# Patient Record
Sex: Female | Born: 1935 | Race: Black or African American | Hispanic: No | Smoking: Never smoker
Health system: Southern US, Community
[De-identification: ages and names within clinical notes are randomized; demographics above are authoritative.]

## PROBLEM LIST (undated history)

## (undated) DIAGNOSIS — K759 Inflammatory liver disease, unspecified: Secondary | ICD-10-CM

## (undated) DIAGNOSIS — F329 Major depressive disorder, single episode, unspecified: Secondary | ICD-10-CM

## (undated) DIAGNOSIS — IMO0001 Reserved for inherently not codable concepts without codable children: Secondary | ICD-10-CM

## (undated) DIAGNOSIS — D649 Anemia, unspecified: Secondary | ICD-10-CM

## (undated) DIAGNOSIS — R011 Cardiac murmur, unspecified: Secondary | ICD-10-CM

## (undated) DIAGNOSIS — M1990 Unspecified osteoarthritis, unspecified site: Secondary | ICD-10-CM

## (undated) DIAGNOSIS — J40 Bronchitis, not specified as acute or chronic: Secondary | ICD-10-CM

## (undated) DIAGNOSIS — R350 Frequency of micturition: Secondary | ICD-10-CM

## (undated) DIAGNOSIS — F32A Depression, unspecified: Secondary | ICD-10-CM

## (undated) DIAGNOSIS — B019 Varicella without complication: Secondary | ICD-10-CM

## (undated) DIAGNOSIS — E039 Hypothyroidism, unspecified: Secondary | ICD-10-CM

## (undated) DIAGNOSIS — E785 Hyperlipidemia, unspecified: Secondary | ICD-10-CM

## (undated) DIAGNOSIS — K259 Gastric ulcer, unspecified as acute or chronic, without hemorrhage or perforation: Secondary | ICD-10-CM

## (undated) DIAGNOSIS — N390 Urinary tract infection, site not specified: Secondary | ICD-10-CM

## (undated) DIAGNOSIS — Z5189 Encounter for other specified aftercare: Secondary | ICD-10-CM

## (undated) HISTORY — PX: BACK SURGERY: SHX140

## (undated) HISTORY — DX: Hyperlipidemia, unspecified: E78.5

## (undated) HISTORY — DX: Cardiac murmur, unspecified: R01.1

## (undated) HISTORY — DX: Unspecified osteoarthritis, unspecified site: M19.90

## (undated) HISTORY — PX: BREAST LUMPECTOMY: SHX2

## (undated) HISTORY — DX: Varicella without complication: B01.9

## (undated) HISTORY — PX: TUBAL LIGATION: SHX77

## (undated) HISTORY — DX: Major depressive disorder, single episode, unspecified: F32.9

## (undated) HISTORY — DX: Depression, unspecified: F32.A

## (undated) HISTORY — PX: ABDOMINAL HYSTERECTOMY: SHX81

## (undated) HISTORY — PX: ABDOMINAL SURGERY: SHX537

## (undated) HISTORY — PX: DILATION AND CURETTAGE OF UTERUS: SHX78

## (undated) HISTORY — DX: Hypothyroidism, unspecified: E03.9

---

## 2005-07-08 HISTORY — PX: KNEE ARTHROSCOPY: SUR90

## 2008-09-10 ENCOUNTER — Emergency Department (HOSPITAL_COMMUNITY): Admission: EM | Admit: 2008-09-10 | Discharge: 2008-09-11 | Payer: Self-pay | Admitting: Emergency Medicine

## 2008-12-23 ENCOUNTER — Encounter: Admission: RE | Admit: 2008-12-23 | Discharge: 2008-12-23 | Payer: Self-pay | Admitting: *Deleted

## 2009-10-11 ENCOUNTER — Encounter (INDEPENDENT_AMBULATORY_CARE_PROVIDER_SITE_OTHER): Payer: Self-pay | Admitting: *Deleted

## 2009-10-25 ENCOUNTER — Ambulatory Visit: Payer: Self-pay | Admitting: Family Medicine

## 2009-10-25 ENCOUNTER — Encounter (INDEPENDENT_AMBULATORY_CARE_PROVIDER_SITE_OTHER): Payer: Self-pay | Admitting: *Deleted

## 2009-10-25 ENCOUNTER — Encounter: Admission: RE | Admit: 2009-10-25 | Discharge: 2009-10-25 | Payer: Self-pay | Admitting: Internal Medicine

## 2009-10-25 DIAGNOSIS — F329 Major depressive disorder, single episode, unspecified: Secondary | ICD-10-CM

## 2009-10-25 DIAGNOSIS — E039 Hypothyroidism, unspecified: Secondary | ICD-10-CM

## 2009-10-25 DIAGNOSIS — M542 Cervicalgia: Secondary | ICD-10-CM | POA: Insufficient documentation

## 2009-10-25 DIAGNOSIS — F29 Unspecified psychosis not due to a substance or known physiological condition: Secondary | ICD-10-CM | POA: Insufficient documentation

## 2009-10-25 DIAGNOSIS — E785 Hyperlipidemia, unspecified: Secondary | ICD-10-CM | POA: Insufficient documentation

## 2009-10-26 ENCOUNTER — Telehealth (INDEPENDENT_AMBULATORY_CARE_PROVIDER_SITE_OTHER): Payer: Self-pay | Admitting: *Deleted

## 2009-10-26 ENCOUNTER — Encounter: Payer: Self-pay | Admitting: Family Medicine

## 2009-10-26 LAB — CONVERTED CEMR LAB
ALT: 25 units/L (ref 0–35)
Alkaline Phosphatase: 65 units/L (ref 39–117)
BUN: 6 mg/dL (ref 6–23)
Basophils Absolute: 0 10*3/uL (ref 0.0–0.1)
Bilirubin, Direct: 0 mg/dL (ref 0.0–0.3)
Chloride: 105 meq/L (ref 96–112)
Creatinine, Ser: 0.7 mg/dL (ref 0.4–1.2)
Eosinophils Relative: 2.2 % (ref 0.0–5.0)
Folate: 9.2 ng/mL
GFR calc non Af Amer: 104.96 mL/min (ref >60)
MCV: 103.7 fL — ABNORMAL HIGH (ref 78.0–100.0)
Monocytes Absolute: 0.2 10*3/uL (ref 0.1–1.0)
Neutrophils Relative %: 38.9 % — ABNORMAL LOW (ref 43.0–77.0)
Platelets: 272 10*3/uL (ref 150.0–400.0)
T3, Free: 1.9 pg/mL — ABNORMAL LOW (ref 2.3–4.2)
TSH: 41.77 microintl units/mL — ABNORMAL HIGH (ref 0.35–5.50)
Total Protein: 7.3 g/dL (ref 6.0–8.3)
Vitamin B-12: 55 pg/mL — ABNORMAL LOW (ref 211–911)
WBC: 3.4 10*3/uL — ABNORMAL LOW (ref 4.5–10.5)

## 2009-10-30 ENCOUNTER — Ambulatory Visit: Payer: Self-pay | Admitting: Cardiovascular Disease

## 2009-10-30 ENCOUNTER — Ambulatory Visit: Payer: Self-pay | Admitting: Family Medicine

## 2009-10-30 DIAGNOSIS — E538 Deficiency of other specified B group vitamins: Secondary | ICD-10-CM

## 2009-10-31 ENCOUNTER — Telehealth (INDEPENDENT_AMBULATORY_CARE_PROVIDER_SITE_OTHER): Payer: Self-pay | Admitting: *Deleted

## 2009-11-02 ENCOUNTER — Telehealth (INDEPENDENT_AMBULATORY_CARE_PROVIDER_SITE_OTHER): Payer: Self-pay | Admitting: *Deleted

## 2009-11-06 ENCOUNTER — Ambulatory Visit: Payer: Self-pay | Admitting: Family Medicine

## 2009-11-22 ENCOUNTER — Ambulatory Visit: Payer: Self-pay | Admitting: Family Medicine

## 2009-11-22 DIAGNOSIS — N643 Galactorrhea not associated with childbirth: Secondary | ICD-10-CM | POA: Insufficient documentation

## 2009-11-22 DIAGNOSIS — K5900 Constipation, unspecified: Secondary | ICD-10-CM

## 2009-11-22 DIAGNOSIS — R109 Unspecified abdominal pain: Secondary | ICD-10-CM | POA: Insufficient documentation

## 2009-11-23 ENCOUNTER — Encounter (INDEPENDENT_AMBULATORY_CARE_PROVIDER_SITE_OTHER): Payer: Self-pay | Admitting: *Deleted

## 2009-11-24 ENCOUNTER — Encounter: Payer: Self-pay | Admitting: Family Medicine

## 2009-11-27 ENCOUNTER — Encounter: Payer: Self-pay | Admitting: Family Medicine

## 2009-11-30 ENCOUNTER — Telehealth (INDEPENDENT_AMBULATORY_CARE_PROVIDER_SITE_OTHER): Payer: Self-pay | Admitting: *Deleted

## 2009-12-01 ENCOUNTER — Ambulatory Visit: Payer: Self-pay | Admitting: Family Medicine

## 2009-12-15 ENCOUNTER — Encounter: Payer: Self-pay | Admitting: Family Medicine

## 2009-12-20 ENCOUNTER — Ambulatory Visit: Payer: Self-pay | Admitting: Internal Medicine

## 2009-12-20 DIAGNOSIS — R030 Elevated blood-pressure reading, without diagnosis of hypertension: Secondary | ICD-10-CM | POA: Insufficient documentation

## 2009-12-20 LAB — CONVERTED CEMR LAB: HDL goal, serum: 40 mg/dL

## 2009-12-25 ENCOUNTER — Encounter: Payer: Self-pay | Admitting: Family Medicine

## 2010-01-13 ENCOUNTER — Emergency Department (HOSPITAL_COMMUNITY): Admission: EM | Admit: 2010-01-13 | Discharge: 2010-01-13 | Payer: Self-pay | Admitting: Emergency Medicine

## 2010-01-23 ENCOUNTER — Telehealth (INDEPENDENT_AMBULATORY_CARE_PROVIDER_SITE_OTHER): Payer: Self-pay | Admitting: *Deleted

## 2010-01-31 ENCOUNTER — Ambulatory Visit: Payer: Self-pay | Admitting: Family Medicine

## 2010-02-02 ENCOUNTER — Telehealth (INDEPENDENT_AMBULATORY_CARE_PROVIDER_SITE_OTHER): Payer: Self-pay | Admitting: *Deleted

## 2010-02-05 ENCOUNTER — Encounter: Payer: Self-pay | Admitting: Family Medicine

## 2010-02-19 ENCOUNTER — Telehealth (INDEPENDENT_AMBULATORY_CARE_PROVIDER_SITE_OTHER): Payer: Self-pay | Admitting: *Deleted

## 2010-03-22 ENCOUNTER — Encounter: Payer: Self-pay | Admitting: Family Medicine

## 2010-04-03 ENCOUNTER — Telehealth: Payer: Self-pay | Admitting: Family Medicine

## 2010-04-20 ENCOUNTER — Telehealth (INDEPENDENT_AMBULATORY_CARE_PROVIDER_SITE_OTHER): Payer: Self-pay | Admitting: *Deleted

## 2010-04-20 ENCOUNTER — Ambulatory Visit: Payer: Self-pay | Admitting: Family Medicine

## 2010-04-20 DIAGNOSIS — L089 Local infection of the skin and subcutaneous tissue, unspecified: Secondary | ICD-10-CM | POA: Insufficient documentation

## 2010-04-20 LAB — CONVERTED CEMR LAB
ALT: 21 units/L (ref 0–35)
Albumin: 4.4 g/dL (ref 3.5–5.2)
Basophils Absolute: 0 10*3/uL (ref 0.0–0.1)
Basophils Relative: 0.5 % (ref 0.0–3.0)
Calcium: 9.5 mg/dL (ref 8.4–10.5)
Cholesterol: 145 mg/dL (ref 0–200)
GFR calc non Af Amer: 135.61 mL/min (ref >60)
HDL: 49.8 mg/dL (ref >39.00)
Hemoglobin: 14.3 g/dL (ref 12.0–15.0)
Lymphocytes Relative: 50.2 % — ABNORMAL HIGH (ref 12.0–46.0)
Monocytes Relative: 7.2 % (ref 3.0–12.0)
Neutro Abs: 1.9 10*3/uL (ref 1.4–7.7)
Neutrophils Relative %: 40.7 % — ABNORMAL LOW (ref 43.0–77.0)
RBC: 4.41 M/uL (ref 3.87–5.11)
Sodium: 140 meq/L (ref 135–145)
TSH: 2.59 microintl units/mL (ref 0.35–5.50)
Total Bilirubin: 1.5 mg/dL — ABNORMAL HIGH (ref 0.3–1.2)
Total Protein: 7.3 g/dL (ref 6.0–8.3)
Triglycerides: 71 mg/dL (ref 0.0–149.0)
VLDL: 14.2 mg/dL (ref 0.0–40.0)

## 2010-04-24 ENCOUNTER — Telehealth (INDEPENDENT_AMBULATORY_CARE_PROVIDER_SITE_OTHER): Payer: Self-pay | Admitting: *Deleted

## 2010-04-25 ENCOUNTER — Encounter: Payer: Self-pay | Admitting: Family Medicine

## 2010-05-03 ENCOUNTER — Encounter: Payer: Self-pay | Admitting: Family Medicine

## 2010-05-04 ENCOUNTER — Ambulatory Visit: Payer: Self-pay | Admitting: Family Medicine

## 2010-05-15 ENCOUNTER — Encounter: Payer: Self-pay | Admitting: Family Medicine

## 2010-05-16 ENCOUNTER — Encounter: Admission: RE | Admit: 2010-05-16 | Discharge: 2010-05-16 | Payer: Self-pay | Admitting: Neurology

## 2010-05-22 ENCOUNTER — Encounter: Payer: Self-pay | Admitting: Family Medicine

## 2010-05-29 ENCOUNTER — Encounter: Payer: Self-pay | Admitting: Family Medicine

## 2010-06-14 ENCOUNTER — Encounter: Payer: Self-pay | Admitting: Family Medicine

## 2010-07-29 ENCOUNTER — Encounter: Payer: Self-pay | Admitting: Internal Medicine

## 2010-08-07 NOTE — Miscellaneous (Signed)
Summary: Discharge/[facility]  Discharge/[facility]   Imported By: Lanelle Bal 06/01/2010 10:43:44  _____________________________________________________________________  External Attachment:    Type:   Image     Comment:   External Document

## 2010-08-07 NOTE — Progress Notes (Signed)
Summary: CT results-lmom  Phone Note Outgoing Call   Call placed by: Mallory Burke,  October 31, 2009 4:45 PM Call placed to: Patient Summary of Call: head CT shows normal aging changes and also changes due to limited blood flow through the small blood vessels.  could be responsible for some of pt's memory loss.  CT of neck shows degenerative changes of the spine and narrowing of the canal which is what is causing pt's pain when she turns her head.  will refer to ortho and/or neurosurg for evaluation and tx.   Follow-up for Phone Call        left message on machine ............Marland KitchenDoristine Burke  October 31, 2009 4:45 PM  left msg to call .Mallory Burke  November 01, 2009 9:44 AM Spoke with pt and pt grandaughter [name] informed of CT results and Dr Beverely Low recommendations of referral  Grandaughter request Ct results she will pickup labs upfront .Mallory Burke  November 01, 2009 2:37 PM   Follow-up by: Mallory Burke,  November 01, 2009 2:37 PM

## 2010-08-07 NOTE — Assessment & Plan Note (Signed)
Summary: 2 week roa//lch   Vital Signs:  Patient profile:   77 year old female Height:      62.5 inches Weight:      214 pounds BMI:     38.66 Pulse rate:   88 / minute BP sitting:   110 / 80  (left arm)  Vitals Entered By: Doristine Devoid CMA (May 04, 2010 4:12 PM) CC: 2 week roa- stomach discomfort    History of Present Illness: 77 yo woman here today to f/u abd wound.  denies oozing of wound.  has been using abx cream two times a day.  denies fever.  abd pain- left sided, pain w/ turning over at night.  no pain currently.  'feels like something pulled loose' after turning over one night.  sxs started 1 month ago.  no nausea, vomiting, diarrhea.  no fevers.  Current Medications (verified): 1)  Lorazepam 0.5 Mg Tabs (Lorazepam) .... Take One Tablet Three Times A Day As Needed 2)  Crestor 20 Mg Tabs (Rosuvastatin Calcium) .Marland Kitchen.. 1 Tablet By Mouth Daily 3)  Methocarbamol 750 Mg Tabs (Methocarbamol) .... Take One Tablet At Bedtime As Needed 4)  Citalopram Hydrobromide 10 Mg Tabs (Citalopram Hydrobromide) .... Take One Talbet Daily 5)  Levothroid 150 Mcg Tabs (Levothyroxine Sodium) .... Take One Tablet Daily 6)  Ex-Lax Maximum Strength 25 Mg Tabs (Sennosides) .... Take One Tablet As Needed 7)  Physical Therapy .... Order For Tens Unit, Neck Massage For Neck Pain, and Leg Exercises 2 Times A Week For 7 Weeks1 8)  Naprosyn 500 Mg Tabs (Naproxen) .... Take One Tablet Two Times A Day 9)  Pennsaid 1.5 % Soln (Diclofenac Sodium) .... As Needed 10)  Mupirocin 2 % Oint (Mupirocin) .... Apply To Belly Button Up To Three Times A Day X10 Days.  Allergies (verified): No Known Drug Allergies  Past History:  Past medical, surgical, family and social histories (including risk factors) reviewed, and no changes noted (except as noted below).  Past Medical History: Reviewed history from 10/25/2009 and no changes required. Arthritis hx of Chicken Pox Heart Murmur   Hyperlipidemia Hypothyroidism Depression  Past Surgical History: Reviewed history from 10/25/2009 and no changes required. Hysterectomy Back Surgery x3 fusion and rod  Family History: Reviewed history from 10/25/2009 and no changes required. Family History of Arthritis Family History High cholesterol Family History Hypertension  Social History: Reviewed history from 10/25/2009 and no changes required. 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 Lungs are clear to auscultation, no crackles or wheezes. Heart:  Normal rate and regular rhythm. S1 and S2 normal without gallop, murmur, click, rub .S4 Abdomen:  soft, NT/ND, +BS, no rebound/guarding.  no hernias appreciated.  umbilical wound well healed, no pus or drainage   Impression & Recommendations:  Problem # 1:  UNSPEC LOCAL INFECTION SKIN&SUBCUTANEOUS TISSUE (ICD-686.9) Assessment Improved umbilicus no longer infected, area well healed.  continue abx cream as needed.  stressed importance of hygeine.  Problem # 2:  ABDOMINAL PAIN (ICD-789.00) Assessment: Unchanged no pain on PE.  discussed that pain may not be actual abd pain but her back pain radiating around given that pain only occurs at night and only w/ changing position.  pt and daughter to f/u w/ neurosurg for chronic back pain.  Complete Medication List: 1)  Lorazepam 0.5 Mg Tabs (Lorazepam) .... Take one tablet three times a day as needed 2)  Crestor 20 Mg Tabs (Rosuvastatin calcium) .Marland Kitchen.. 1 tablet by mouth daily 3)  Methocarbamol 750 Mg Tabs (Methocarbamol) .... Take one tablet at bedtime as needed 4)  Citalopram Hydrobromide 10 Mg Tabs (Citalopram hydrobromide) .... Take one talbet daily 5)  Levothroid 150 Mcg Tabs (Levothyroxine  sodium) .... Take one tablet daily 6)  Ex-lax Maximum Strength 25 Mg Tabs (Sennosides) .... Take one tablet as needed 7)  Physical Therapy  .... Order for tens unit, neck massage for neck pain, and leg exercises 2 times a week for 7 weeks1 8)  Naprosyn 500 Mg Tabs (Naproxen) .... Take one tablet two times a day 9)  Pennsaid 1.5 % Soln (Diclofenac sodium) .... As needed 10)  Mupirocin 2 % Oint (Mupirocin) .... Apply to belly button up to three times a day x10 days.  Patient Instructions: 1)  The belly button wound looks great!  Use the antibiotic ointment as needed 2)  I don't think your belly pain is actually belly pain- i think it is actually your back pain. 3)  Call with any questions or concerns 4)  Hang in there!   Orders Added: 1)  Est. Patient Level III [16109]

## 2010-08-07 NOTE — Letter (Signed)
Summary: Handicapped Placard/[facility]  Handicapped Placard/[facility]   Imported By: Lanelle Bal 02/12/2010 10:54:42  _____________________________________________________________________  External Attachment:    Type:   Image     Comment:   External Document

## 2010-08-07 NOTE — Progress Notes (Signed)
Summary: paperwork for assisted living-  Phone Note Outgoing Call   Call placed by: Doristine Devoid CMA,  January 23, 2010 12:10 PM Call placed to: Patient Summary of Call: received paperwork for senior and patient needs to schedule office visit (30 min appt) to complete paperwork.   left message on machine for patient granddaughter [name] to return call..........Marland KitchenDoristine Devoid CMA  January 23, 2010 12:11 PM   Follow-up for Phone Call        appt scheduled........Marland KitchenDoristine Devoid CMA  January 29, 2010 9:12 AM

## 2010-08-07 NOTE — Miscellaneous (Signed)
Summary: Care Plan/[facility]  Care Plan/[facility]   Imported By: Lanelle Bal 12/22/2009 10:03:16  _____________________________________________________________________  External Attachment:    Type:   Image     Comment:   External Document

## 2010-08-07 NOTE — Miscellaneous (Signed)
Summary: mammogram  Clinical Lists Changes  Observations: Added new observation of MAMMOGRAM: normal (10/25/2009 15:07)      Preventive Care Screening  Mammogram:    Date:  10/25/2009    Results:  normal

## 2010-08-07 NOTE — Assessment & Plan Note (Signed)
Summary: B12 SHOT/CDJ  Nurse Visit   Medication Administration  Injection # 1:    Medication: Vit B12 1000 mcg    Diagnosis: B12 DEFICIENCY (ICD-266.2)    Route: IM    Site: L deltoid    Exp Date: 08/09/2011    Lot #: [id number]    Mfr: American Regent    Given by: Doristine Devoid (October 30, 2009 1:19 PM)  Orders Added: 1)  Vit B12 1000 mcg [J3420] 2)  Admin of Therapeutic Inj  intramuscular or subcutaneous [96372]   Medication Administration  Injection # 1:    Medication: Vit B12 1000 mcg    Diagnosis: B12 DEFICIENCY (ICD-266.2)    Route: IM    Site: L deltoid    Exp Date: 08/09/2011    Lot #: [id number]    Mfr: American Regent    Given by: Doristine Devoid (October 30, 2009 1:19 PM)  Orders Added: 1)  Vit B12 1000 mcg [J3420] 2)  Admin of Therapeutic Inj  intramuscular or subcutaneous [30865]

## 2010-08-07 NOTE — Letter (Signed)
Summary: CT Imaging Options Form/[facility]  CT Imaging Options Form/[facility] [place]   Imported By: Lanelle Bal 10/31/2009 12:58:50  _____________________________________________________________________  External Attachment:    Type:   Image     Comment:   External Document

## 2010-08-07 NOTE — Progress Notes (Signed)
Summary: [facility] DO[facility]NT HAVE [facility]ULT[facility]LE PRESCRIPTIONS SENT TODAY  Phone Note Call from Patient   Caller: Mallory Burke  [phone] Summary of Call: PATIENT'S DAUGHTER [facility] [address]---THEY SAY THEY DONT HAVE ANY PRESCRIPTIONS FOR THIS PATIENT  MANY PRESCRIPTIONS LISTED ON OFFICE VISIT--PLEASE RESEND, THEN CALL DAUGHTER Initial call taken by: Jerolyn Shin,  April 20, 2010 4:32 PM  Follow-up for Phone Call        left detail message rx resent to pharmacy.............Marland KitchenFelecia Deloach CMA  April 20, 2010 4:45 PM     Prescriptions: MUPIROCIN 2 % OINT (MUPIROCIN) apply to belly button up to three times a day x10 days.  #1 x 1   Entered by:   Jeremy Johann CMA   Authorized by:   Neena Rhymes MD   Signed by:   Jeremy Johann CMA on 04/20/2010   Method used:   [facility] ...       [facility]  [address] [id number]       [address]       [place], [place]  [address]       Ph: [phone] or [phone]       Fax: [phone]   RxID:   [id number] LEVOTHROID 150 MCG TABS (LEVOTHYROXINE SODIUM) take one tablet daily  #30 x 6   Entered by:   Jeremy Johann CMA   Authorized by:   Neena Rhymes MD   Signed by:   Jeremy Johann CMA on 04/20/2010   Method used:   [facility]ed to ...       [facility]  [address] [id number]       [address]       [place], [place]  [address]       Ph: [phone] or [phone]       Fax: [phone]   RxID:   [id number] CITALOPRAM HYDROBROMIDE 10 MG TABS (CITALOPRAM HYDROBROMIDE) take one talbet daily  #30 x 6   Entered by:   Jeremy Johann CMA   Authorized by:   Neena Rhymes MD   Signed by:   Jeremy Johann CMA on 04/20/2010   Method used:   [facility]ed to ...       [facility]  [address] [id number]       [address]       [place], [place]  [address]       Ph: [phone] or [phone]       Fax: [phone]   RxID:    [id number] METHOCARBAMOL 750 MG TABS (METHOCARBAMOL) take one tablet at bedtime as needed  #30 x 6   Entered by:   Jeremy Johann CMA   Authorized by:   Neena Rhymes MD   Signed by:   Jeremy Johann CMA on 04/20/2010   Method used:   [facility]-F[facility]ed to ...       [facility]  [address] [id number]       [address]       [place], [place]  [address]       Ph: [phone] or [phone]       Fax: [phone]   RxID:   [id number] CRESTOR 20 MG TABS (ROSUVASTATIN CALCIUM) 1 tablet by mouth daily  #30 x 6   Entered by:   Jeremy Johann CMA   Authorized by:   Neena Rhymes  MD   Signed by:   Jeremy Johann CMA on 04/20/2010   Method used:   Re-F[facility]ed to ...       [facility]  [address] [id number]       [address]       [place], [place]  [address]       Ph: [phone] or [phone]       Fax: [phone]   RxID:   [id number]

## 2010-08-07 NOTE — Miscellaneous (Signed)
Summary: Face to Face Encounter/[facility]  Face to Face Encounter/[facility]   Imported By: Lanelle Bal 05/11/2010 08:20:31  _____________________________________________________________________  External Attachment:    Type:   Image     Comment:   External Document

## 2010-08-07 NOTE — Miscellaneous (Signed)
Summary: Missed Visit/[facility]  Missed Visit/[facility]   Imported By: Lanelle Bal 12/29/2009 12:50:21  _____________________________________________________________________  External Attachment:    Type:   Image     Comment:   External Document

## 2010-08-07 NOTE — Assessment & Plan Note (Signed)
Summary: 3 week roa ( appt)//lch   Vital Signs:  Patient profile:   77 year old female Weight:      213 pounds Pulse rate:   92 / minute BP sitting:   120 / 80  (left arm)  Vitals Entered By: Doristine Devoid (Nov 22, 2009 3:34 PM) CC: 3 week roa and B12 shot   History of Present Illness: 77 yo woman here today to f/u on  1) Hypothyroid- taking medicine daily per pt report.  isn't sure about improvement in confusion.  pt is alone today and very scattered in her responses- hard time staying on track.  2) B12 deficiency- has started shot series  3) Hyperlipidemia- pt's cholesterol is not controlled on Lovastatin 20mg .  4) Neck arthritis- pt saw ortho and was given unknown gel for pain relief.  still having a lot of pain.  not interested in Neurosurg at this time.  open to the idea of PT but doesn't drive- would need home PT.  5) Galactorrhea- pt reports milk leaking from breast.  supposedly had mammogram but isn't sure of report.  not sure who is working this up for pt.  pt not sure either  6) Abd pain- pt mentions pain but unable to localize, qualify, describe.  does admit to infrequent bowels.  reports she has had similar sxs for 'awhile'.  denies fevers, chills, N/V/D.  **pt very scattered, difficult time getting pt to answer even direct questions.  pt gives rambling answers which often involve additional, new sxs and never really clarifies sxs we are discussing.  Allergies (verified): No Known Drug Allergies  Past History:  Past Medical History: Last updated: 10/25/2009 Arthritis hx of Chicken Pox Heart Murmur  Hyperlipidemia Hypothyroidism Depression  Past Surgical History: Last updated: 10/25/2009 Hysterectomy Back Surgery x3 fusion and rod  Review of Systems      See HPI  Physical Exam  General:  Well-developed,well-nourished,in no acute distress; overwt Head:  Normocephalic and atraumatic without obvious abnormalities. No apparent alopecia or  balding. Neck:  decreased ROM w/ rotation.  good flexion and extension. Lungs:  Normal respiratory effort, chest expands symmetrically. Lungs are clear to auscultation, no crackles or wheezes. Heart:  reg S1/S2 Abdomen:  soft, NT/ND, +BS, no rebound/guarding. Pulses:  +2 carotid, radial, DP Extremities:  trace to +1 edema of LEs bilaterally  Psych:  very scattered today, having difficult time answering questions.   Impression & Recommendations:  Problem # 1:  HYPOTHYROIDISM (ICD-244.9) Assessment Unchanged pt reports med compliance but given difficulty answering today's questions i'm not sure about this.  will check TSH. Her updated medication list for this problem includes:    Levothroid 150 Mcg Tabs (Levothyroxine sodium) .Marland Kitchen... Take one tablet daily  Orders: Venipuncture ([address]109) TLB-TSH (Thyroid Stimulating Hormone) (84443-TSH)  Problem # 2:  B12 DEFICIENCY (ICD-266.2) Assessment: Unchanged receiving regular injxns. Orders: Vit B12 1000 mcg (J3420) Admin of Therapeutic Inj  intramuscular or subcutaneous (60454)  Problem # 3:  HYPERLIPIDEMIA (ICD-272.4) Assessment: Unchanged not controlled on Lovastatin, start Crestor 20mg  samples. Her updated medication list for this problem includes:    Lovastatin 20 Mg Tabs (Lovastatin) .Marland Kitchen... Take one tablet daily  Problem # 4:  NECK PAIN (ICD-723.1) pt unclear what ortho's plan was for this and not interested in neurosurg at this time.  will do PT if she can have home health PT.  will refer. Her updated medication list for this problem includes:    Methocarbamol 750 Mg Tabs (Methocarbamol) .Marland Kitchen... Take one tablet  at bedtime as needed  Orders: Home Health Referral [facility])  Problem # 5:  GALACTORRHEA (ICD-611.6) Assessment: New pt reportedly had recent mammogram.  need to review these results and speak w/ grand daughter to determine if someone is already working this up.  if not, needs a prolactin level.  Problem # 6:  ABDOMINAL  PAIN (ICD-789.00) Assessment: New pt very vague in description of location, duration, onset, timing, quality of sxs.  admits to infrequent BMs.  check xrays to assess degree of constipation.  PE WNL.  start Miralax.  will follow at future visits. Orders: T-Abdomen 2-view (16109UE) Prescription Created Electronically [phone])  Problem # 7:  CONFUSION (ICD-298.9) Assessment: Unchanged pt very confused today and having difficult time answering questions.  will need to speak w/ family and prefer that pt doesn't come alone to visits.  if family is interested will pursue dementia w/u.  Complete Medication List: 1)  Lorazepam 0.5 Mg Tabs (Lorazepam) .... Take one tablet three times a day as needed 2)  Lovastatin 20 Mg Tabs (Lovastatin) .... Take one tablet daily 3)  Hydrocodone/apap  .... Take one tablet every 4-6 hours as needed 4)  Methocarbamol 750 Mg Tabs (Methocarbamol) .... Take one tablet at bedtime as needed 5)  Citalopram Hydrobromide 10 Mg Tabs (Citalopram hydrobromide) .... Take one talbet daily 6)  Levothroid 150 Mcg Tabs (Levothyroxine sodium) .... Take one tablet daily 7)  Miralax Powd (Polyethylene glycol 3350) .Marland Kitchen.. 17g dissolved in 8 oz of liquid once daily as needed constipation.  disp 1 large bottle 8)  Physical Therapy  .... Order for tens unit, neck massage for neck pain, and leg exercises 2 times a week for 7 weeks1  Other Orders: Gastroenterology Referral (GI)  Patient Instructions: 1)  Please schedule a follow up in 3 weeks (30 minute visit) to discuss your abdominal pain.  Please make sure your granddaughter can come with you 2)  Go to [address] to get your abdominal xray 3)  We'll notify you of your lab results 4)  STOP your lovastatin while you take the samples of Crestor 5)  Someone will call you about physical therapy 6)  Take the Miralax daily as directed to help with your constipation 7)  Someone will call you with your GI appt for your colonoscopy 8)  HANG  IN THERE! Prescriptions: MIRALAX   POWD (POLYETHYLENE GLYCOL 3350) 17g dissolved in 8 oz of liquid once daily as needed constipation.  disp 1 large bottle  #1 bottle x 3   Entered and Authorized by:   Neena Rhymes MD   Signed by:   Neena Rhymes MD on 11/22/2009   Method used:   Electronically to        [facility] (retail)       [address]       [place], [place]  [address]       Ph: [phone]       Fax: [phone]   RxID:   [id number]    Medication Administration  Injection # 1:    Medication: Vit B12 1000 mcg    Diagnosis: B12 DEFICIENCY (ICD-266.2)    Route: IM    Site: R deltoid    Exp Date: 05/09/2011    Lot #: [id number]    Mfr: American Regent    Given by: Doristine Devoid (Nov 22, 2009 4:25 PM)  Orders Added: 1)  Venipuncture [64403] 2)  T-Abdomen  2-view [74020TC] 3)  Vit B12 1000 mcg [J3420] 4)  Admin of Therapeutic Inj  intramuscular or subcutaneous [96372] 5)  TLB-TSH (Thyroid Stimulating Hormone) [84443-TSH] 6)  Home Health Referral [facility]] 7)  Gastroenterology Referral [GI] 8)  Est. Patient Level IV [62130] 9)  Prescription Created Electronically [phone]   Appended Document: Orders Update    Clinical Lists Changes  Orders: Added new Service order of Prescription Created Electronically [id number]) - Signed

## 2010-08-07 NOTE — Progress Notes (Signed)
Summary: labs-  Phone Note Outgoing Call Call back at Granddaughter [phone]   Call placed by: Doristine Devoid,  October 26, 2009 4:24 PM Call placed to: Patient Summary of Call: pt's labs are abnormal.  - TSH is very high.  please add free T3/T4 to labs.  need to know if pt is taking medication daily as directed.  given this level it seems she is not. -B12 is low.  needs to start injections. -both of these are reasons for pt's extreme fatigue. -total cholesterol and LDL are also high.  will discuss changing meds w/ pt when she returns for her next visit.  if pt is truly taking levothyroxine daily needs to increase to daily.  if she's not taking meds regularly she needs to start.  this is likely cause of pt's overwhelming fatigue.   Follow-up for Phone Call        left message on machine for pt granddaughter to return call........Marland KitchenDoristine Devoid  October 26, 2009 4:26 PM   spoke w/ patient granddaughter informed of labs and information also will start injections on monday will also give copy of labs........Marland KitchenDoristine Devoid  October 26, 2009 4:59 PM

## 2010-08-07 NOTE — Progress Notes (Signed)
Summary: should pt keep ortho appt  Phone Note Call from Patient Call back at [phone]   Summary of Call: patient granddtr tina wants to know if patient still need to keep appt at orthoped -  Initial call taken by: Okey Regal [place],  November 02, 2009 4:43 PM  Follow-up for Phone Call        Spoke with pt granddaughter, [name]  she will keep appt tomorrow with Dr Ethelene Hal .Kandice Hams  November 02, 2009 4:58 PM  Follow-up by: Kandice Hams,  November 02, 2009 4:58 PM

## 2010-08-07 NOTE — Consult Note (Signed)
Summary: [facility]  [facility]   Imported By: Lanelle Bal 06/07/2010 07:57:03  _____________________________________________________________________  External Attachment:    Type:   Image     Comment:   External Document

## 2010-08-07 NOTE — Assessment & Plan Note (Signed)
Summary: followup on cholesterol/rsc from 10/7//kn   Vital Signs:  Patient profile:   77 year old female Height:      62.5 inches Weight:      209 pounds Temp:     98.5 degrees F oral Pulse rate:   68 / minute BP sitting:   130 / 78  (left arm)  Vitals Entered By: Jeremy Johann CMA (April 20, 2010 9:25 AM) CC: fasting, discuss home health aide, b12 injection, refills Comments pt uses [facility] [address]   History of Present Illness: 77 yo woman here today for   1) Hyperlipidemia- on Crestor daily, grandaughter isn't sure she's taking meds.  due for labs.  2) Hypothyroid- confusion has returned, 'like when her thyroid was a mess'.  making late night phone calls, having memory problems.  3) Neck pain- no showed w/ neurosurg, granddaughter plans on calling.  on NSAIDs.  family would prefer to avoid narcotics given pt's confusion.  4) bleeding from the belly button- having pus from area along w/ bleeding.  first noticed in September but started draining 2 weeks ago.  pt denies pain.  daughter reports this is ongoing hygeine issue.  denies fevers, chills.  Current Medications (verified): 1)  Lorazepam 0.5 Mg Tabs (Lorazepam) .... Take One Tablet Three Times A Day As Needed 2)  Crestor 20 Mg Tabs (Rosuvastatin Calcium) .Marland Kitchen.. 1 Tablet By Mouth Daily 3)  Methocarbamol 750 Mg Tabs (Methocarbamol) .... Take One Tablet At Bedtime As Needed 4)  Citalopram Hydrobromide 10 Mg Tabs (Citalopram Hydrobromide) .... Take One Talbet Daily 5)  Levothroid 150 Mcg Tabs (Levothyroxine Sodium) .... Take One Tablet Daily 6)  Ex-Lax Maximum Strength 25 Mg Tabs (Sennosides) .... Take One Tablet As Needed 7)  Physical Therapy .... Order For Tens Unit, Neck Massage For Neck Pain, and Leg Exercises 2 Times A Week For 7 Weeks1 8)  Naprosyn 500 Mg Tabs (Naproxen) .... Take One Tablet Two Times A Day 9)  Pennsaid 1.5 % Soln (Diclofenac Sodium) .... As Needed 10)  Mupirocin 2 % Oint (Mupirocin) .... Apply To  Belly Button Up To Three Times A Day X10 Days.  Allergies (verified): No Known Drug Allergies  Past History:  Past Medical History: Last updated: 10/25/2009 Arthritis hx of Chicken Pox Heart Murmur  Hyperlipidemia Hypothyroidism Depression  Review of Systems      See HPI  Physical Exam  General:  in no acute distress; alert,appropriate and cooperative throughout examination Head:  Normocephalic and atraumatic without obvious abnormalities. No apparent alopecia or balding. Neck:  decreased ROM w/ rotation.  good flexion and extension. Breasts:  no galactorrhea or bloody discharge.  no masses.  dry skin on nipple w/ cracking. Lungs:  Normal respiratory effort, chest expands symmetrically. Lungs are clear to auscultation, no crackles or wheezes. Heart:  Normal rate and regular rhythm. S1 and S2 normal without gallop, murmur, click, rub .S4 Abdomen:  soft, NT/ND, +BS.  + purulent drainage from umbilicus but no induration or obvious abscess.  some skin breakdown of skin inside navel. Pulses:  +2 carotid, radial, DP Neurologic:  alert & oriented X3 and cranial nerves II-XII intact.   Psych:  normally interactive, good eye contact, not anxious appearing, and not depressed appearing.     Impression & Recommendations:  Problem # 1:  HYPERLIPIDEMIA (ICD-272.4) Assessment Unchanged due for labs today.  family member unsure if pt is taking meds appropriately. Her updated medication list for this problem includes:    Crestor 20 Mg Tabs (  Rosuvastatin calcium) .Marland Kitchen... 1 tablet by mouth daily  Orders: Venipuncture ([address]) TLB-Lipid Panel (80061-LIPID) TLB-Hepatic/Liver Function Pnl (80076-HEPATIC)  Problem # 2:  HYPOTHYROIDISM (ICD-244.9) Assessment: Unchanged family reports increased confusion, similar to when pt wasn't taking thyroid meds.  will check level to ensure pt taking meds. Her updated medication list for this problem includes:    Levothroid 150 Mcg Tabs (Levothyroxine  sodium) .Marland Kitchen... Take one tablet daily  Orders: Specimen Handling (60454) TLB-TSH (Thyroid Stimulating Hormone) [phone]) Prescription Created Electronically [phone])  Problem # 3:  CONFUSION (ICD-298.9) Assessment: Unchanged not apparent at OV today but per family pt has difficulty taking meds and maintaining good physical hygeine.  will refer  to home health.  wanted to get UA on pt but she was unable to void.  check labs to r/o infxn and electrolyte abnormality. Orders: Home Health Referral [facility]) Specimen Handling (62130) TLB-BMP (Basic Metabolic Panel-BMET) (80048-METABOL) TLB-CBC Platelet - w/Differential (85025-CBCD)  Problem # 4:  UNSPEC LOCAL INFECTION SKIN&SUBCUTANEOUS TISSUE (ICD-686.9) Assessment: New no obvious abscess of umbilicus but some purulent drainage present.  skin breakdown noticed.  start topical abx ointment.  follow closely.  if no improvement will need oral abx.  reviewed signs of worsening infxn- family member expressed understanding. Orders: T-Culture, Wound (87070/[id number])  Problem # 5:  GALACTORRHEA (ICD-611.6) Assessment: Unchanged no evidence of milk or bloody nipple discharge on PE.  normal mammogram 4/11.  some nipple dryness w/ cracking.  refer to gyn Orders: Specimen Handling (86578) Gynecologic Referral (Gyn)  Complete Medication List: 1)  Lorazepam 0.5 Mg Tabs (Lorazepam) .... Take one tablet three times a day as needed 2)  Crestor 20 Mg Tabs (Rosuvastatin calcium) .Marland Kitchen.. 1 tablet by mouth daily 3)  Methocarbamol 750 Mg Tabs (Methocarbamol) .... Take one tablet at bedtime as needed 4)  Citalopram Hydrobromide 10 Mg Tabs (Citalopram hydrobromide) .... Take one talbet daily 5)  Levothroid 150 Mcg Tabs (Levothyroxine sodium) .... Take one tablet daily 6)  Ex-lax Maximum Strength 25 Mg Tabs (Sennosides) .... Take one tablet as needed 7)  Physical Therapy  .... Order for tens unit, neck massage for neck pain, and leg exercises 2 times a week  for 7 weeks1 8)  Naprosyn 500 Mg Tabs (Naproxen) .... Take one tablet two times a day 9)  Pennsaid 1.5 % Soln (Diclofenac sodium) .... As needed 10)  Mupirocin 2 % Oint (Mupirocin) .... Apply to belly button up to three times a day x10 days.  Other Orders: Admin of Therapeutic Inj  intramuscular or subcutaneous (46962) Vit B12 1000 mcg (X5284)  Patient Instructions: 1)  Follow up in 2 weeks to look at the belly button wound 2)  Apply the mupirocin to the navel 2-3x/day for 10 days 3)  We'll notify you of your lab results 4)  Someone will call you with your GYN appt and Home Health Evaluations 5)  Please reschedule with neurosurgery for your back and neck pain 6)  Call with any questions or concerns 7)  Hang in there! Prescriptions: LORAZEPAM 0.5 MG TABS (LORAZEPAM) take one tablet three times a day as needed  #90 x 3   Entered and Authorized by:   Neena Rhymes MD   Signed by:   Neena Rhymes MD on 04/20/2010   Method used:   Print then Give to Patient   RxID:   [id number] LEVOTHROID 150 MCG TABS (LEVOTHYROXINE SODIUM) take one tablet daily  #30 x 6   Entered and Authorized by:   Neena Rhymes MD  Signed by:   Neena Rhymes MD on 04/20/2010   Method used:   [facility] to        [facility]  [id number]       [address]       [place], [place]  [address]       Ph: [phone] or [phone]       Fax: [phone]   RxID:   [id number] CITALOPRAM HYDROBROMIDE 10 MG TABS (CITALOPRAM HYDROBROMIDE) take one talbet daily  #30 x 6   Entered and Authorized by:   Neena Rhymes MD   Signed by:   Neena Rhymes MD on 04/20/2010   Method used:   [facility]ically to        [facility]  [address] [id number]       [address]       [place], [place]  [address]       Ph: [phone] or [phone]       Fax: [phone]   RxID:   [id number] METHOCARBAMOL 750 MG TABS (METHOCARBAMOL) take one tablet at  bedtime as needed  #30 x 6   Entered and Authorized by:   Neena Rhymes MD   Signed by:   Neena Rhymes MD on 04/20/2010   Method used:   [facility]ically to        [facility]  [id number]       [address]       [place], [place]  [address]       Ph: [phone] or [phone]       Fax: [phone]   RxID:   [id number] CRESTOR 20 MG TABS (ROSUVASTATIN CALCIUM) 1 tablet by mouth daily  #30 x 6   Entered and Authorized by:   Neena Rhymes MD   Signed by:   Neena Rhymes MD on 04/20/2010   Method used:   [facility]ically to        [facility]  [address] [id number]       [address]       [place], [place]  [address]       Ph: [phone] or [phone]       Fax: [phone]   RxID:   [id number] MUPIROCIN 2 % OINT (MUPIROCIN) apply to belly button up to three times a day x10 days.  #1 x 1   Entered and Authorized by:   Neena Rhymes MD   Signed by:   Neena Rhymes MD on 04/20/2010   Method used:   [facility]        [facility]  [id number]       [address]       [place], [place]  [address]       Ph: [phone] or [phone]       Fax: [phone]   RxID:   [id number]    Medication Administration  Injection # 1:    Medication: Vit B12 1000 mcg    Diagnosis: B12 DEFICIENCY (ICD-266.2)    Route: IM    Site: R deltoid    Exp Date: 09/06/2011    Lot #: [id number]    Mfr: American Regent    Patient tolerated injection without complications    Given by: Jeremy Johann CMA (April 20, 2010 9:45 AM)  Orders Added: 1)  Admin of Therapeutic Inj  intramuscular or subcutaneous [96372] 2)  Vit B12 1000 mcg [J3420] 3)  Venipuncture [36415] 4)  Home Health Referral [Home Health] 5)  Specimen Handling [99000] 6)  T-Culture, Wound [87070/[id number]] 7)  Gynecologic Referral [Gyn] 8)  TLB-Lipid Panel [80061-LIPID] 9)  TLB-Hepatic/Liver Function Pnl  [80076-HEPATIC] 10)  TLB-TSH (Thyroid Stimulating Hormone) [84443-TSH] 11)  TLB-BMP (Basic Metabolic Panel-BMET) [80048-METABOL] 12)  TLB-CBC Platelet - w/Differential [85025-CBCD] 13)  Est. Patient Level IV [81191] 14)  Prescription Created Electronically [phone]

## 2010-08-07 NOTE — Progress Notes (Signed)
Summary: order for TENS  Phone Note Other Incoming Call back at [phone]   Caller: [facility] Physical Therapist Summary of Call: would like to get order for TENS unit C-spine, gentle neck massage, and leg exercise for leg weakness he will be seeing the patient 2 times weekly for 7 weeks.  [facility] [phone] Initial call taken by: Doristine Devoid,  Nov 30, 2009 4:21 PM  Follow-up for Phone Call        done and faxed.....Marland KitchenMarland KitchenDoristine Devoid  Dec 05, 2009 9:08 AM     New/Updated Medications: * PHYSICAL THERAPY order for TENS unit, neck massage for neck pain, and leg exercises 2 times a week for 7 weeks1 Prescriptions: PHYSICAL THERAPY order for TENS unit, neck massage for neck pain, and leg exercises 2 times a week for 7 weeks1  #1 x 0   Entered by:   Doristine Devoid   Authorized by:   Neena Rhymes MD   Signed by:   Doristine Devoid on 12/05/2009   Method used:   Print then Give to Patient   RxID:   [id number]

## 2010-08-07 NOTE — Miscellaneous (Signed)
Summary: Care Plan/[facility]  Care Plan/[facility]   Imported By: Lanelle Bal 06/07/2010 07:53:53  _____________________________________________________________________  External Attachment:    Type:   Image     Comment:   External Document

## 2010-08-07 NOTE — Letter (Signed)
Summary: Patient No Show/[facility]  Patient No Show/[facility]   Imported By: Lanelle Bal 04/10/2010 13:15:14  _____________________________________________________________________  External Attachment:    Type:   Image     Comment:   External Document

## 2010-08-07 NOTE — Progress Notes (Signed)
  Phone Note Outgoing Call   Call placed by: Durwin Glaze RN,  February 19, 2010 5:20 PM Call placed to: Patient Summary of Call: Attempted to reach pt.'s granddaughters because she has missed 3 previsit appts. Unable to reach them. Left message on granddaughter, Tina's cell phone. Asked her to call office to reschedule pv and colonoscopy. Told pt. we would cancel pv and colonoscopy and asked her to have her granddaughters call us to reschedule when they are available to bring her. Pt. thanked [place]. Initial call taken by: Durwin Glaze RN,  February 19, 2010 5:22 PM

## 2010-08-07 NOTE — Assessment & Plan Note (Signed)
Summary: 30 MIN APPT PER CHEMIRA////SPH   Vital Signs:  Patient profile:   77 year old female Weight:      216 pounds Pulse rate:   58 / minute BP sitting:   130 / 74  (left arm)  Vitals Entered By: Doristine Devoid CMA (January 31, 2010 3:50 PM) CC: discuss assisted living   History of Present Illness: 77 yo woman here today to discuss assisted living.  pt is looking to move back to [place].  grand daughter is here w/ her- reports that she will not be moving at this time.  considering assisted living facilities here but wants to remain independent.  has lots of family locally and speaks w/ them multiple times daily.  grand daughter feels the memory issues from earlier this year have resolved.  hyperlipidemia- pt has multiple statin bottles at her appt.  has been doing mail order and gets a year's worth of meds mailed at once.  when meds are switched she then gets confused and has too many meds.  has been taking the Crestor samples I gave her  neck pain- has been doing PT but doesn't seem to like her therapist.  pain is not improving.  aware that she has DJD.  now open to idea of neurosurgery referral.  B12 deficiency- pt has not been following through with her B12 shots.  needs one today.   Problems Prior to Update: 1)  Cervicalgia  (ICD-723.1) 2)  Elevated Blood Pressure Without Diagnosis of Hypertension  (ICD-796.2) 3)  Galactorrhea  (ICD-611.6) 4)  Abdominal Pain  (ICD-789.00) 5)  Constipation  (ICD-564.00) 6)  Special Screening For Malignant Neoplasms Colon  (ICD-V76.51) 7)  B12 Deficiency  (ICD-266.2) 8)  Neck Pain  (ICD-723.1) 9)  Confusion  (ICD-298.9) 10)  Depression  (ICD-311) 11)  Hypothyroidism  (ICD-244.9) 12)  Hyperlipidemia  (ICD-272.4)  Current Medications (verified): 1)  Lorazepam 0.5 Mg Tabs (Lorazepam) .... Take One Tablet Three Times A Day As Needed 2)  Crestor 20 Mg Tabs (Rosuvastatin Calcium) .Marland Kitchen.. 1 Tablet By Mouth Daily 3)  Methocarbamol 750 Mg Tabs  (Methocarbamol) .... Take One Tablet At Bedtime As Needed 4)  Citalopram Hydrobromide 10 Mg Tabs (Citalopram Hydrobromide) .... Take One Talbet Daily 5)  Levothroid 150 Mcg Tabs (Levothyroxine Sodium) .... Take One Tablet Daily 6)  Ex-Lax Maximum Strength 25 Mg Tabs (Sennosides) .... Take One Tablet As Needed 7)  Physical Therapy .... Order For Tens Unit, Neck Massage For Neck Pain, and Leg Exercises 2 Times A Week For 7 Weeks1 8)  Naprosyn 500 Mg Tabs (Naproxen) .... Take One Tablet Two Times A Day 9)  Pennsaid 1.5 % Soln (Diclofenac Sodium) .... As Needed  Allergies (verified): No Known Drug Allergies  Past History:  Past Medical History: Last updated: 10/25/2009 Arthritis hx of Chicken Pox Heart Murmur  Hyperlipidemia Hypothyroidism Depression  Review of Systems General:  Denies chills, fatigue, fever, malaise, and sweats. Eyes:  Complains of blurring; denies double vision; needs new glasses. ENT:  Complains of decreased hearing. CV:  Denies chest pain or discomfort and shortness of breath with exertion. MS:  Complains of joint pain and stiffness; denies joint redness, joint swelling, and loss of strength. Neuro:  Complains of headaches and poor balance; denies difficulty with concentration, falling down, and memory loss.  Physical Exam  General:  in no acute distress; alert,appropriate and cooperative throughout examination Head:  Normocephalic and atraumatic without obvious abnormalities. No apparent alopecia or balding. Eyes:  PERRL, EOMI Neck:  decreased ROM w/ rotation.  good flexion and extension. Lungs:  Normal respiratory effort, chest expands symmetrically. Lungs are clear to auscultation, no crackles or wheezes. Heart:  Normal rate and regular rhythm. S1 and S2 normal without gallop, murmur, click, rub .S4 Pulses:  +2 carotid, radial, DP Extremities:  No clubbing, cyanosis, edema. Psych:  normally interactive, good eye contact, not anxious appearing, and not  depressed appearing.     Impression & Recommendations:  Problem # 1:  CONFUSION (ICD-298.9) Assessment Improved pt and granddaughter feel that once pt's meds were straightened out and TSH normalized pt's sxs improved.  she is having much easier time answering questions today.  will follow.  Problem # 2:  CERVICALGIA (ICD-723.1) Assessment: Unchanged  pt will switch PT, grand daughter will call home health.  will refer to Neurosurg. Her updated medication list for this problem includes:    Methocarbamol 750 Mg Tabs (Methocarbamol) .Marland Kitchen... Take one tablet at bedtime as needed    Naprosyn 500 Mg Tabs (Naproxen) .Marland Kitchen... Take one tablet two times a day  Orders: Neurosurgeon Referral (Neurosurgeon)  Problem # 3:  HYPERLIPIDEMIA (ICD-272.4) Assessment: Unchanged reviewed pt's multiple meds- told grand daughter to discard the meds w/ exception of Crestor.  will continue Crestor samples. Her updated medication list for this problem includes:    Crestor 20 Mg Tabs (Rosuvastatin calcium) .Marland Kitchen... 1 tablet by mouth daily  Problem # 4:  B12 DEFICIENCY (ICD-266.2) Assessment: Unchanged injxn given. Orders: Vit B12 1000 mcg (J3420) Admin of Therapeutic Inj  intramuscular or subcutaneous ([address])  Complete Medication List: 1)  Lorazepam 0.5 Mg Tabs (Lorazepam) .... Take one tablet three times a day as needed 2)  Crestor 20 Mg Tabs (Rosuvastatin calcium) .Marland Kitchen.. 1 tablet by mouth daily 3)  Methocarbamol 750 Mg Tabs (Methocarbamol) .... Take one tablet at bedtime as needed 4)  Citalopram Hydrobromide 10 Mg Tabs (Citalopram hydrobromide) .... Take one talbet daily 5)  Levothroid 150 Mcg Tabs (Levothyroxine sodium) .... Take one tablet daily 6)  Ex-lax Maximum Strength 25 Mg Tabs (Sennosides) .... Take one tablet as needed 7)  Physical Therapy  .... Order for tens unit, neck massage for neck pain, and leg exercises 2 times a week for 7 weeks1 8)  Naprosyn 500 Mg Tabs (Naproxen) .... Take one tablet two  times a day 9)  Pennsaid 1.5 % Soln (Diclofenac sodium) .... As needed  Patient Instructions: 1)  Please schedule a 30 minute appt in Oct for your cholesterol.  Do not eat before this appt 2)  Switch your Physical therapist if the relationship isn't working- work on the neck and leg pain 3)  We'll call you with your neurosurgery appt 4)  Call and reschedule your colonoscopy 5)  Call and schedule your eye exam 6)  Plan on monthly B12 shots 7)  Continue the Crestor daily 8)  Call with any questions or concerns 9)  Hang in there!!! Prescriptions: CRESTOR 20 MG TABS (ROSUVASTATIN CALCIUM) 1 tablet by mouth daily  #90 x 3   Entered and Authorized by:   Neena Rhymes MD   Signed by:   Neena Rhymes MD on 02/06/2010   Method used:   Historical   RxID:   [id number] LORAZEPAM 0.5 MG TABS (LORAZEPAM) take one tablet three times a day as needed  #90 x 3   Entered and Authorized by:   Neena Rhymes MD   Signed by:   Neena Rhymes MD on 01/31/2010   Method used:   Print then Give  to Patient   RxID:   [id number]    Medication Administration  Injection # 1:    Medication: Vit B12 1000 mcg    Diagnosis: B12 DEFICIENCY (ICD-266.2)    Route: IM    Site: L deltoid    Exp Date: 09/06/2011    Lot #: [id number]    Mfr: American Regent    Given by: Doristine Devoid CMA (January 31, 2010 4:45 PM)  Orders Added: 1)  Vit B12 1000 mcg [J3420] 2)  Admin of Therapeutic Inj  intramuscular or subcutaneous [96372] 3)  Neurosurgeon Referral [Neurosurgeon] 4)  Est. Patient Level IV [14782]

## 2010-08-07 NOTE — Progress Notes (Signed)
Summary: TWO HANDICAP STICKER REQUESTS  Phone Note Call from Patient Call back at Work Phone [phone] Call back at [facility] [phone]   Caller: GRANDDAUGHTER TINA Summary of Call: PATIENTS GRANDDAIGHTER TINA DROPPED OFF TWO APPLICATIONS---ONE FOR HANDICAP PLATE FOR HER CAR AND ONE FOR HANDICAP PLACRD TO USE WHEN SHE TRAVELS IN SOMEONE ELSE CAR  NEEDS SIGNATURE ON BOTH FORMS  CALL TINA GARRETT-FULLER TO PICK BOTH FORMS UP---SHE SAYS THAT HER HUSBAND DONALD FULLER MAY COME TO GET THESE FORMS) Initial call taken by: Jerolyn Shin,  February 02, 2010 4:33 PM  Follow-up for Phone Call        spoke w/ tina informed papers ready for pick up........Marland KitchenDoristine Devoid CMA  February 05, 2010 9:29 AM

## 2010-08-07 NOTE — Miscellaneous (Signed)
Summary: Case Conference/[facility]  Case Conference/[facility]   Imported By: Lanelle Bal 05/15/2010 11:18:52  _____________________________________________________________________  External Attachment:    Type:   Image     Comment:   External Document

## 2010-08-07 NOTE — Miscellaneous (Signed)
  Clinical Lists Changes  Orders: Added new Referral order of Radiology Referral (Radiology) - Signed 

## 2010-08-07 NOTE — Letter (Signed)
Summary: New Patient Letter  [facility] [place]  [address] [place], [place] [address]   Phone: [phone]  Fax: [phone]       10/11/2009 MRN: [id number]  ADJOA ALTHOUSE [address] [place], [place]  [address]  Dear Ms. Harvel Ricks,   Welcome to [facility] and thank you for choosing [place] as your Primary Care Providers. Enclosed you will find information about our practice that we hope you find helpful. We have also enclosed forms to be filled out prior to your visit. This will provide [place] with the necessary information and facilitate your being seen in a timely manner. If you have any questions, please call us at:  [phone]        and we will be happy to assist you. We look forward to seeing you at your scheduled appointment time.  Appointment   Morene Antu 10,2725 @ 10:30AM            with Dr.  Beverely Low               Sincerely,  Primary Health Care Team  Please arrive 15 minutes early for your first appointment and bring your insurance card. Co-pay is required at the time of your visit.  *****Please call the office if you are not able to keep this appointment. There is a charge of $50.00 if any appointment is not cancelled or rescheduled within 24 hours.

## 2010-08-07 NOTE — Miscellaneous (Signed)
Summary: Case Conference/[facility]  Case Conference/[facility]   Imported By: Lanelle Bal 12/18/2009 13:19:10  _____________________________________________________________________  External Attachment:    Type:   Image     Comment:   External Document

## 2010-08-07 NOTE — Miscellaneous (Signed)
Summary: Face to Face Encounter Form/[facility]  Face to Face Encounter Form/[facility]   Imported By: Lanelle Bal 12/05/2009 13:15:19  _____________________________________________________________________  External Attachment:    Type:   Image     Comment:   External Document

## 2010-08-07 NOTE — Progress Notes (Signed)
Summary: [facility] needs more info  Phone Note Other Incoming   Caller: Lisha --[facility]  Summary of Call: [facility] says they have received referral for help for this patient --they need a meds list and a diagnosis code list---says jsut listing problems is not enough info  her number is [phone], her fax is [phone] Initial call taken by: Jerolyn Shin,  April 24, 2010 1:04 PM  Follow-up for Phone Call        spoke with home care info faxed............Marland KitchenFelecia Deloach CMA  April 24, 2010 4:16 PM

## 2010-08-07 NOTE — Miscellaneous (Signed)
Summary: items to discuss w/ granddaughter  Clinical Lists Changes  Please call pt's granddaughter, Mallory Burke, and discuss the following-  1) Milk leaking from breast- has she had a mammogram?  Is anyone following this?  This is something she just mentioned in passing but definitely needs followed up on.  2) Medications- pt still seems confused about her medications and which ones she needs to take when.  is she using a weekly pill box?  is she taking her meds?  3) Confusion- initially it seemed that pt's confusion may have been from her underactive thyroid or B12 deficiency but now that TSH is within range and her confusion is just as bad (if not worse) than at previous visit- it may be worthwhile to pursue a dementia evaluation.  would family be open to this idea?  what do they witness at home w/ pt?  4) Abdominal pain- again this is something pt was very vague about.  couldn't tell me how long it has been hurting, where it hurts, etc.  has order for abd xray.  may be as simple as constipation (prescription for Miralax was sent to pharmacy)  5) Neck pain- pt was unable to tell me what ortho did for her or what the plan was moving forward.  offered pt neurosurg evaluation given the CT report but she states she doesn't want to do anything about it.  but then in the next sentence says she wants it fixed.  not sure what to do.  Neena Rhymes MD  Nov 23, 2009 4:52 PM.   Sherron Monday w/ patient granddaughter and says that patient is taking her medication daily. Says every sun. she fills up her pill box also address the following items mentioned above and she says that the milk from breast was something that she has heard her grandmother mention but hasn't seen it for herself and that sister-in-law helps her with baths and hasn't seen or mentioned it either.  But she is current on her mammogram done 4/201. Also says this was a busy weekend for patient due to family events and noticed that she was getting a  little anxious and says she gets like this when she is around alot of people but takes anxiety med for this which helps. Granddaughter says she and other members have noticed changes in her mental status and would like to go ahead with further work-up to assess for dementia. Informed that grandmother mentioned some abdominal pain and we need to get abdominal xray, Mallory Burke stated that she does complain of these symptoms so informed how important work up is necessary. Also patient is scheduled to have PT done and information about GI and instructions from office visit faxed to [facility] granddaughter.Marland KitchenMarland KitchenDoristine Devoid, Nov 27, 2009 11:43am

## 2010-08-07 NOTE — Assessment & Plan Note (Signed)
Summary: NEW PT/MEDCIARE/UHC/NS/KDC   Vital Signs:  Patient profile:   77 year old female Weight:      209.4 pounds Pulse rate:   74 / minute BP sitting:   116 / 80  (left arm)  Vitals Entered By: Doristine Devoid (October 25, 2009 10:25 AM) CC: NEW EST- HA X1 month and R ear pressure    History of Present Illness: 77 yo woman here today to establish care.  moved from [place] 2 years ago.  saw Dr Ronne Binning at [facility] but was unhappy.  Hyperlipidemia- on Lovastatin 20mg .  last had labs done 'a long time ago'.  no N/V, myalgias.  Hypothyroid- on levothyroxine .  overdue for labs.  Confusion- on Citalopram.  granddaughter reports pt is very sleepy, forgetful.  has days and nights mixed up.  family reports she gets lost.  HA/neck pain- describes a pressure behind R ear.  started 1-2 months ago.  pain w/ lying down, turning head.  pt reports it has been occuring daily but today is 'better'.  not bothered by light/sound.  no N/V.  no dizziness.  no visual changes.  pain is described as a throbbing pain.  improves w/ rubbing area behind R ear.  no relief w/ tylenol.  relief w/ naproxen but causes sleepiness and confusion.  pain will shoot down R arm occasionally.    Preventive Screening-Counseling & Management  Alcohol-Tobacco     Smoking Status: never  Caffeine-Diet-Exercise     Does Patient Exercise: no  Current Medications (verified): 1)  Lorazepam 0.5 Mg Tabs (Lorazepam) .... Take One Tablet Three Times A Day As Needed 2)  Lovastatin 20 Mg Tabs (Lovastatin) .... Take One Tablet Daily 3)  Hydrocodone/apap .... Take One Tablet Every 4-6 Hours As Needed 4)  Methocarbamol 750 Mg Tabs (Methocarbamol) .... Take One Tablet At Bedtime As Needed 5)  Citalopram Hydrobromide 10 Mg Tabs (Citalopram Hydrobromide) .... Take One Talbet Daily 6)  Levothroid 150 Mcg Tabs (Levothyroxine Sodium) .... 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 Eyes:  Denies blurring, double vision, and light sensitivity. ENT:  Denies decreased hearing and earache. CV:  Denies chest pain or discomfort, palpitations, shortness of breath with exertion, swelling of feet, and swelling of hands. Resp:  Denies cough and shortness of breath. GI:  Denies change in bowel habits, nausea, and vomiting. MS:  Complains of stiffness. Neuro:  Complains of headaches, memory loss, and numbness; denies poor balance, seizures, and sensation of room spinning. Psych:  Denies anxiety, depression, and irritability.  Physical Exam  General:  Well-developed,well-nourished,in no acute distress; alert,appropriate and cooperative throughout examination.  overwt Head:  Normocephalic and atraumatic without obvious abnormalities. No apparent alopecia or balding. Eyes:  PERRL, EOMI Ears:  External ear exam shows no significant lesions or deformities.  Otoscopic examination reveals clear canals, tympanic membranes are intact bilaterally without bulging, retraction, inflammation or discharge. Hearing is grossly normal bilaterally. Nose:  External nasal examination shows no deformity  or inflammation. Nasal mucosa are pink and moist without lesions or exudates. Mouth:  Oral mucosa and oropharynx without lesions or exudates.  Neck:  decreased ROM w/ rotation.  good flexion and extension.  (-)  Spurling's Lungs:  Normal respiratory effort, chest expands symmetrically. Lungs are clear to auscultation, no crackles or wheezes. Heart:  reg S1/S2 Pulses:  +2 carotid, radial, DP Extremities:  trace to +1 edema of LEs bilaterally  Neurologic:  alert & oriented X3, cranial nerves II-XII intact, strength normal in all extremities, sensation intact to light touch, and DTRs symmetrical and normal.   Psych:  Oriented X3, normally interactive, good eye contact, not anxious appearing, and not depressed appearing.     Impression & Recommendations:  Problem # 1:  HYPOTHYROIDISM (ICD-244.9) Assessment New pt currently on replacement tx.  overdue for labs.  if not treated appropriately could be contributing to pt's fatigue. Her updated medication list for this problem includes:    Levothroid 150 Mcg Tabs (Levothyroxine sodium) .Marland Kitchen... Take one tablet daily  Orders: Venipuncture (16109) TLB-TSH (Thyroid Stimulating Hormone) (84443-TSH)  Problem # 2:  HYPERLIPIDEMIA (ICD-272.4) Assessment: New overdue for labs.  pt fasting today.  check labs and adjust meds as needed. Her updated medication list for this problem includes:    Lovastatin 20 Mg Tabs (Lovastatin) .Marland Kitchen... Take one tablet daily  Orders: TLB-Lipid Panel (80061-LIPID) TLB-Hepatic/Liver Function Pnl (80076-HEPATIC)  Problem # 3:  NECK PAIN (ICD-723.1) Assessment: New pt's pain w/ rotation likely indicates arthritic/degenerative change.  also the likely cause of pt's headaches.  refer to ortho given pt's hx of back surgeries.  reviewed appropriate use of medication- pt admits to overusing narcotics.  this could also be cause of pt's fatigue and confusion. Her updated medication list for this problem includes:    Methocarbamol 750 Mg Tabs (Methocarbamol) .Marland Kitchen... Take one tablet at bedtime as needed  Orders: Orthopedic Referral (Ortho)  Problem # 4:  CONFUSION (ICD-298.9) Assessment: New grand daughter concerned for Alzheimer's.  given  pt's multiple meds and dx there are many possible causes for this confusion.  will wean pt of benzo as she doesn't know why she is taking it.  will get head CT to r/o mass.  check labs to assess for reversible causes.  will follow closely.  pt and granddaughter in agreement. Orders: Radiology Referral (Radiology) TLB-BMP (Basic Metabolic Panel-BMET) (80048-METABOL) TLB-CBC Platelet - w/Differential (85025-CBCD) TLB-B12 + Folate Pnl (60454_09811-B14/NWG)  Complete Medication List: 1)  Lorazepam 0.5 Mg Tabs (Lorazepam) .... Take one tablet three times a day as needed 2)  Lovastatin 20 Mg Tabs (Lovastatin) .... Take one tablet daily 3)  Hydrocodone/apap  .... Take one tablet every 4-6 hours as needed 4)  Methocarbamol 750 Mg Tabs (Methocarbamol) .... Take one tablet at bedtime as needed 5)  Citalopram Hydrobromide 10 Mg Tabs (Citalopram hydrobromide) .... Take one talbet daily 6)  Levothroid 150 Mcg Tabs (Levothyroxine sodium) .... Take one tablet daily  Patient Instructions: 1)  Please schedule a follow-up appointment in 3 weeks (30 minute slot). 2)  Someone will call you with your CT appt 3)  Someone will call you with your ortho appt 4)  Please decrease the Lorazepam to two times a day x1 week and then once daily x1 week and then stop. 5)  Use the Methocarbamol for night only- will cause drowsiness 6)  The hydrocodone only for severe pain and only as directed 7)  Take the Naproxen two times a day- with food- to decrease inflammation 8)  Continue to heat your neck for pain relief 9)  We'll discuss your labs at your next visit 10)  Call with any questions or concerns 11)  Welcome!  We're glad to have you! 12)  Happy Easter!

## 2010-08-07 NOTE — Progress Notes (Signed)
Summary: NEUROSURGERY REFERRAL  Phone Note Other Incoming   Summary of Call: IN REFERENCE TO NEUROSURGERY REFERRAL........Marland KitchenPER FAX FROM [facility], PATIENT NOSHOWED FOR HER APPOINTMENT WITH DR. CABBELL & DID NOT RESCHEDULE, I HAVE LM FOR PT TO CALL ME Magdalen Spatz [facility]  April 03, 2010 4:45 PM  I S/W PT SHE SAYS SHE PLANS TO [facility], BUT ASKED ME TO CALL HER BACK LATER.    Initial call taken by: Magdalen Spatz [facility]  April 03, 2010 4:48 PM  Follow-up for Phone Call        noted Follow-up by: Neena Rhymes MD,  April 04, 2010 8:57 AM

## 2010-08-07 NOTE — Assessment & Plan Note (Signed)
Summary: ELEVATED BP AT PHYSICAL THERAPY/CDJ   Vital Signs:  Patient profile:   77 year old female Weight:      216.8 pounds Temp:     98.7 degrees F oral Pulse rate:   88 / minute Resp:     17 per minute BP sitting:   136 / 88  (left arm) Cuff size:   large  Vitals Entered By: Shonna Chock (December 20, 2009 2:39 PM) CC: 1.) Patient was at physical therapy and b/p was elevated. Patient states she was in pain the night before and didnt get much rest. 2.) Right foot concerns, Lipid Management, Hypertension Management Comments REVIEWED MED LIST, PATIENT AGREED DOSE AND INSTRUCTION CORRECT    CC:  1.) Patient was at physical therapy and b/p was elevated. Patient states she was in pain the night before and didnt get much rest. 2.) Right foot concerns, Lipid Management, and Hypertension Management.  History of Present Illness: BP was elevated  as per Home  Physical Therapy; systolic > 200  ,but she was in pain . Her neck pain is improved slightly with PT. No PMH of HTN.  Hypertension History:      She denies headache, chest pain, palpitations, dyspnea with exertion, orthopnea, PND, peripheral edema, visual symptoms, neurologic problems, and syncope.        Positive major cardiovascular risk factors include female age 36 years old or older and hyperlipidemia.  Negative major cardiovascular risk factors include non-tobacco-user status.    Lipid Management History:      Positive NCEP/ATP III risk factors include female age 9 years old or older.  Negative NCEP/ATP III risk factors include HDL cholesterol greater than 60 and non-tobacco-user status.      Allergies (verified): No Known Drug Allergies  Review of Systems Eyes:  Denies blurring, double vision, and vision loss-both eyes. ENT:  Denies nosebleeds. Neuro:  Denies brief paralysis, numbness, tingling, and weakness.  Physical Exam  General:  in no acute distress; alert,appropriate and cooperative throughout examination Lungs:   Normal respiratory effort, chest expands symmetrically. Lungs are clear to auscultation, no crackles or wheezes. Heart:  Normal rate and regular rhythm. S1 and S2 normal without gallop, murmur, click, rub .S4 Pulses:  R and L carotid,radial,dorsalis pedis and posterior tibial pulses are full and equal bilaterally Extremities:  No clubbing, cyanosis, edema.   Impression & Recommendations:  Problem # 1:  ELEVATED BLOOD PRESSURE WITHOUT DIAGNOSIS OF HYPERTENSION (ICD-796.2) due to #2  Problem # 2:  CERVICALGIA (ICD-723.1)  Her updated medication list for this problem includes:    Methocarbamol 750 Mg Tabs (Methocarbamol) .Marland Kitchen... Take one tablet at bedtime as needed  Complete Medication List: 1)  Lorazepam 0.5 Mg Tabs (Lorazepam) .... Take one tablet three times a day as needed 2)  Lovastatin 20 Mg Tabs (Lovastatin) .... Take one tablet daily 3)  Hydrocodone/apap  .... Take one tablet every 4-6 hours as needed 4)  Methocarbamol 750 Mg Tabs (Methocarbamol) .... Take one tablet at bedtime as needed 5)  Citalopram Hydrobromide 10 Mg Tabs (Citalopram hydrobromide) .... Take one talbet daily 6)  Levothroid 150 Mcg Tabs (Levothyroxine sodium) .... Take one tablet daily 7)  Miralax Powd (Polyethylene glycol 3350) .Marland Kitchen.. 17g dissolved in 8 oz of liquid once daily as needed constipation.  disp 1 large bottle 8)  Physical Therapy  .... Order for tens unit, neck massage for neck pain, and leg exercises 2 times a week for 7 weeks1  Hypertension Assessment/Plan:  The patient's hypertensive risk group is category B: At least one risk factor (excluding diabetes) with no target organ damage.  Today's blood pressure is 136/88.    Lipid Assessment/Plan:      Based on NCEP/ATP III, the patient's risk factor category is "0-1 risk factors".  The patient's lipid goals are as follows: Total cholesterol goal is 200; LDL cholesterol goal is 160; HDL cholesterol goal is 40; Triglyceride goal is 150.    Patient  Instructions: 1)  Limit your Sodium (Salt) to less than 4 grams a day (slightly less than 1 teaspoon) to prevent fluid retention, swelling, or worsening or symptoms. Use Mrs Sharilyn Sites or "No Salt" or pepper. 2)  Check your Blood Pressure regularly. If it is above:140/90 ON AVERAGE  you should make an appointment.

## 2010-08-09 NOTE — Miscellaneous (Signed)
Summary: Discharge/[facility]  Discharge/[facility]   Imported By: Lanelle Bal 06/28/2010 10:43:39  _____________________________________________________________________  External Attachment:    Type:   Image     Comment:   External Document

## 2010-08-09 NOTE — Letter (Signed)
Summary: [facility]  [facility]   Imported By: Lanelle Bal 07/03/2010 09:16:24  _____________________________________________________________________  External Attachment:    Type:   Image     Comment:   External Document

## 2010-08-22 ENCOUNTER — Telehealth: Payer: Self-pay | Admitting: Family Medicine

## 2010-08-29 NOTE — Progress Notes (Signed)
Summary: REFILL  Phone Note Refill Request Message from:  Patient  Refills Requested: Medication #1:  NAPROXEN 500MG  1TABLET TWICE A DAY [facility] [address]  Initial call taken by: Okey Regal [place],  August 22, 2010 10:18 AM  Follow-up for Phone Call        last OV 05-04-10....Marland KitchenMarland KitchenFelecia Deloach CMA  August 22, 2010 10:23 AM   Additional Follow-up for Phone Call Additional follow up Details #1::        ok for #60, no refills. Additional Follow-up by: Neena Rhymes MD,  August 22, 2010 10:44 AM    Prescriptions: NAPROSYN 500 MG TABS (NAPROXEN) take one tablet two times a day  #60 x 0   Entered by:   Jeremy Johann CMA   Authorized by:   Neena Rhymes MD   Signed by:   Jeremy Johann CMA on 08/22/2010   Method used:   [facility] to ...       [facility]  [address] [id number]       [address]       [place], [place]  [address]       Ph: [phone] or [phone]       Fax: [phone]   RxID:   [id number]

## 2010-09-30 ENCOUNTER — Other Ambulatory Visit: Payer: Self-pay | Admitting: Family Medicine

## 2010-10-01 NOTE — Telephone Encounter (Signed)
Done by Dr. Tabori. 

## 2010-10-01 NOTE — Telephone Encounter (Signed)
Pt was last seen 05/04/10. Last refilled 08/22/10. Please advise.

## 2010-10-15 ENCOUNTER — Other Ambulatory Visit: Payer: Self-pay | Admitting: Family Medicine

## 2010-10-15 DIAGNOSIS — Z1231 Encounter for screening mammogram for malignant neoplasm of breast: Secondary | ICD-10-CM

## 2010-10-29 ENCOUNTER — Ambulatory Visit
Admission: RE | Admit: 2010-10-29 | Discharge: 2010-10-29 | Disposition: A | Discharge status: Home / Self Care | Payer: Medicare Other | Source: Ambulatory Visit | Attending: Family Medicine | Admitting: Family Medicine

## 2010-10-29 DIAGNOSIS — Z1231 Encounter for screening mammogram for malignant neoplasm of breast: Secondary | ICD-10-CM

## 2010-10-31 ENCOUNTER — Encounter: Payer: Self-pay | Admitting: Family Medicine

## 2010-11-08 ENCOUNTER — Ambulatory Visit: Payer: Medicare Other | Admitting: Family Medicine

## 2010-11-08 DIAGNOSIS — Z0289 Encounter for other administrative examinations: Secondary | ICD-10-CM

## 2010-11-26 ENCOUNTER — Other Ambulatory Visit: Payer: Self-pay | Admitting: Family Medicine

## 2010-11-27 MED ORDER — NAPROXEN 500 MG PO TABS: 500.0000 mg | ORAL_TABLET | Freq: Two times a day (BID) | ORAL | Status: DC

## 2010-11-27 NOTE — Telephone Encounter (Signed)
Last OV 05-04-10, last filled 09-30-10 #60

## 2010-11-27 NOTE — Telephone Encounter (Signed)
Refill x1 

## 2010-11-27 NOTE — Telephone Encounter (Signed)
rx sent

## 2010-11-29 ENCOUNTER — Ambulatory Visit (INDEPENDENT_AMBULATORY_CARE_PROVIDER_SITE_OTHER): Payer: Medicare Other | Admitting: Family Medicine

## 2010-11-29 ENCOUNTER — Encounter: Payer: Self-pay | Admitting: Family Medicine

## 2010-11-29 DIAGNOSIS — E538 Deficiency of other specified B group vitamins: Secondary | ICD-10-CM

## 2010-11-29 DIAGNOSIS — E039 Hypothyroidism, unspecified: Secondary | ICD-10-CM

## 2010-11-29 DIAGNOSIS — R030 Elevated blood-pressure reading, without diagnosis of hypertension: Secondary | ICD-10-CM

## 2010-11-29 DIAGNOSIS — M542 Cervicalgia: Secondary | ICD-10-CM

## 2010-11-29 MED ORDER — CYANOCOBALAMIN 1000 MCG/ML IJ SOLN
1000.0000 ug | Freq: Once | INTRAMUSCULAR | Status: AC
  Administered 2010-11-29: 1000 ug via INTRAMUSCULAR

## 2010-11-29 NOTE — Progress Notes (Signed)
  Subjective:    Patient ID: Mallory Burke, female    DOB: [birth year], 77 y.o.   MRN: [id number]  HPI FL2 completion- pt is moving to assisted living facility and needs forms completed.  Neck pain- has never followed up w/ neurosurg as previously discussed.  Due to pt's spinal hardware i am uncertain if MRI is even possible- pt has had CT scan of area.  Pt not taking much in way of pain meds and she reports daughter 'hides' what meds we have prescribed so that she doesn't 'overuse them'.  Hypothyroid- family again reports that pt is having difficulty w/ confusion.  Last time this happened her thyroid levels were abnormal.  Granddaughter who is w/ pt today isn't sure if she is taking her meds.  Called pt's daughter at work and she reports that she is taking meds but isn't sure that she's doing it correctly.  Elevated BP- pt reports this is due to her level of pain.  BP has been elevated at multiple visits- pt always has reason why.  Denies CP, SOB, visual changes, edema  Review of Systems For ROS see HPI     Objective:   Physical Exam  Constitutional: She is oriented to person, place, and time. She appears well-developed and well-nourished. No distress.  HENT:  Head: Normocephalic and atraumatic.  Eyes: Conjunctivae and EOM are normal. Pupils are equal, round, and reactive to light.  Neck: Normal range of motion. Neck supple. No thyromegaly present.  Cardiovascular: Normal rate, regular rhythm, normal heart sounds and intact distal pulses.   Pulmonary/Chest: Effort normal and breath sounds normal. No respiratory distress. She has no wheezes.  Abdominal: Soft. Bowel sounds are normal. She exhibits no distension. There is no tenderness.  Musculoskeletal: She exhibits tenderness (TTP over R cervical spine). She exhibits no edema.  Neurological: She is alert and oriented to person, place, and time. No cranial nerve deficit.  Skin: Skin is warm and dry.  Psychiatric: She has a normal mood and  affect. Her behavior is normal.          Assessment & Plan:

## 2010-11-29 NOTE — Patient Instructions (Signed)
We'll call you when your forms are available for pickup We'll notify you of your neurosurgery appt If neurosurgery can't help, we'll send you to pain management. Please have your daughter call and tell me what medicines need to be refilled Call with any questions or concerns Sherri Rad in there!

## 2010-11-30 ENCOUNTER — Telehealth: Payer: Self-pay | Admitting: *Deleted

## 2010-11-30 ENCOUNTER — Encounter: Payer: Self-pay | Admitting: *Deleted

## 2010-11-30 LAB — TSH: TSH: 8.78 u[IU]/mL — ABNORMAL HIGH (ref 0.35–5.50)

## 2010-11-30 NOTE — Telephone Encounter (Addendum)
Spoke with Patient will have granddaughter to return call because she is the one in charge of meds.  Message copied by Verdene Rio on Fri Nov 30, 2010  5:27 PM ------      Message from: Sheliah Hatch      Created: Fri Nov 30, 2010  3:08 PM       TSH is elevated.  Please make sure she is taking her thyroid medication daily.  If not, she needs to.  If she is taking this daily she needs to increase to daily.  Repeat labs in 3 months.

## 2010-12-04 NOTE — Assessment & Plan Note (Signed)
Due for B12 shot- pt not getting these regularly as she should.

## 2010-12-04 NOTE — Assessment & Plan Note (Signed)
Pt continues to have elevated BP in office.  She reports that it is due to her pain level.  Currently asymptomatic.  If high at next visit will consider starting meds.  Pt expressed understanding and is in agreement w/ plan.

## 2010-12-04 NOTE — Assessment & Plan Note (Signed)
Again try and refer pt to neurosurg.  Stressed the importance of f/u w/ this appt.  If neurosurg unable to help w/ pt's pain will refer to pain management.  Discussed w/ pt's daughter (via phone) and granddaughter that the meds she has are not habit forming and should not be hidden from her.

## 2010-12-04 NOTE — Assessment & Plan Note (Signed)
Check labs to see if TSH is again elevated and possibly causing pt's confusion.

## 2010-12-05 NOTE — Telephone Encounter (Signed)
Spoke with Pt will have granddaughter call to verify med and changes.

## 2010-12-07 ENCOUNTER — Ambulatory Visit (INDEPENDENT_AMBULATORY_CARE_PROVIDER_SITE_OTHER): Payer: Medicare Other | Admitting: *Deleted

## 2010-12-07 DIAGNOSIS — Z111 Encounter for screening for respiratory tuberculosis: Secondary | ICD-10-CM

## 2010-12-07 DIAGNOSIS — E538 Deficiency of other specified B group vitamins: Secondary | ICD-10-CM

## 2010-12-07 MED ORDER — CYANOCOBALAMIN 1000 MCG/ML IJ SOLN
1000.0000 ug | Freq: Once | INTRAMUSCULAR | Status: AC
  Administered 2010-12-07: 1000 ug via INTRAMUSCULAR

## 2010-12-18 ENCOUNTER — Encounter: Payer: Self-pay | Admitting: *Deleted

## 2010-12-18 NOTE — Telephone Encounter (Signed)
Letter Mail to patient

## 2010-12-24 ENCOUNTER — Ambulatory Visit (INDEPENDENT_AMBULATORY_CARE_PROVIDER_SITE_OTHER): Payer: Medicare Other | Admitting: *Deleted

## 2010-12-24 DIAGNOSIS — Z111 Encounter for screening for respiratory tuberculosis: Secondary | ICD-10-CM

## 2010-12-26 LAB — TB SKIN TEST: TB Skin Test: NEGATIVE mm

## 2011-01-22 ENCOUNTER — Telehealth: Payer: Self-pay | Admitting: *Deleted

## 2011-01-22 MED ORDER — AMBULATORY NON FORMULARY MEDICATION: Status: DC

## 2011-01-22 NOTE — Telephone Encounter (Signed)
The office received fax that stated " May I have a order for PT".  I called facility for more info/clarification. They need order for physical therapy:  PT/OT/ST. Possible dx:  Knee pain, back pain, neck pain, and limited mobility.  Ok for order per Dr. Laury Axon.

## 2011-01-24 ENCOUNTER — Ambulatory Visit (INDEPENDENT_AMBULATORY_CARE_PROVIDER_SITE_OTHER): Payer: Medicare Other | Admitting: Family Medicine

## 2011-01-24 ENCOUNTER — Encounter: Payer: Self-pay | Admitting: Family Medicine

## 2011-01-24 ENCOUNTER — Other Ambulatory Visit: Payer: Self-pay | Admitting: Family Medicine

## 2011-01-24 VITALS — BP 118/78 | HR 68 | Temp 97.8°F | Wt 206.4 lb

## 2011-01-24 DIAGNOSIS — M1990 Unspecified osteoarthritis, unspecified site: Secondary | ICD-10-CM

## 2011-01-24 DIAGNOSIS — M25569 Pain in unspecified knee: Secondary | ICD-10-CM

## 2011-01-24 DIAGNOSIS — J069 Acute upper respiratory infection, unspecified: Secondary | ICD-10-CM

## 2011-01-24 DIAGNOSIS — M542 Cervicalgia: Secondary | ICD-10-CM

## 2011-01-24 DIAGNOSIS — M25561 Pain in right knee: Secondary | ICD-10-CM

## 2011-01-24 DIAGNOSIS — M25562 Pain in left knee: Secondary | ICD-10-CM | POA: Insufficient documentation

## 2011-01-24 MED ORDER — HYDROCODONE-ACETAMINOPHEN 5-500 MG PO TABS: 1.0000 | ORAL_TABLET | Freq: Four times a day (QID) | ORAL | Status: DC | PRN

## 2011-01-24 MED ORDER — OFLOXACIN 0.3 % OT SOLN: OTIC | Status: DC

## 2011-01-24 MED ORDER — MOMETASONE FUROATE 50 MCG/ACT NA SUSP: 2.0000 | Freq: Every day | NASAL | Status: DC

## 2011-01-24 NOTE — Assessment & Plan Note (Signed)
Knee sleeve Pain med Ortho if no improvement

## 2011-01-24 NOTE — Progress Notes (Signed)
  Subjective:    Patient ID: Mallory Burke, female    DOB: 1932/12/24, 77 y.o.   MRN: [id number]  HPI Pt here with caretaker from appointment mate c/o neck and R ear pain x 2-3 months.  She also has some congestion.  No otc meds.   Pt has hx surgery C spine 15-20 years ago and she has rods in her neck.  She was supposed to see a neurosurgeon but pt never knew about her appointment. She also c/o knee pain and she feels like her R knee is going to give out on her.   No known injury but she has hx of arthroscopic surgery. Review of Systems as above   Objective:   Physical Exam  Constitutional: She is oriented to person, place, and time. She appears well-developed and well-nourished.  HENT:  Right Ear: External ear normal.  Left Ear: External ear normal.  Mouth/Throat: No oropharyngeal exudate.       R ear  + cerumen  Neck: Normal range of motion. Neck supple.       Pain with turning neck to Right And palpation R trap No swelling or errythema   Pulmonary/Chest: Effort normal and breath sounds normal.  Musculoskeletal: She exhibits tenderness.       Pain R side neck with rotation and palpation Pain K knee with weight bearing  Neurological: She is alert and oriented to person, place, and time.  Psychiatric: She has a normal mood and affect. Her behavior is normal.          Assessment & Plan:

## 2011-01-24 NOTE — Assessment & Plan Note (Signed)
nasonex 2 sprays each nostril qd

## 2011-01-24 NOTE — Assessment & Plan Note (Signed)
Pt never went to neurosurgery Appointment will be rescheduled Change pain med to vicodin 5/500 --ultram is not helping pain

## 2011-01-29 ENCOUNTER — Other Ambulatory Visit: Payer: Self-pay | Admitting: Family Medicine

## 2011-01-29 NOTE — Telephone Encounter (Signed)
Refill sent, pt will due for lipid check 04/2011.

## 2011-02-12 ENCOUNTER — Emergency Department (HOSPITAL_COMMUNITY): Payer: Medicare Other

## 2011-02-12 ENCOUNTER — Emergency Department (HOSPITAL_COMMUNITY)
Admission: EM | Admit: 2011-02-12 | Discharge: 2011-02-12 | Disposition: A | Discharge status: Home / Self Care | Payer: Medicare Other | Attending: Emergency Medicine | Admitting: Emergency Medicine

## 2011-02-12 DIAGNOSIS — E039 Hypothyroidism, unspecified: Secondary | ICD-10-CM | POA: Insufficient documentation

## 2011-02-12 DIAGNOSIS — E7800 Pure hypercholesterolemia, unspecified: Secondary | ICD-10-CM | POA: Insufficient documentation

## 2011-02-12 DIAGNOSIS — M542 Cervicalgia: Secondary | ICD-10-CM | POA: Insufficient documentation

## 2011-02-12 DIAGNOSIS — R51 Headache: Secondary | ICD-10-CM | POA: Insufficient documentation

## 2011-02-12 DIAGNOSIS — R42 Dizziness and giddiness: Secondary | ICD-10-CM | POA: Insufficient documentation

## 2011-02-13 ENCOUNTER — Telehealth: Payer: Self-pay | Admitting: *Deleted

## 2011-02-13 DIAGNOSIS — M1990 Unspecified osteoarthritis, unspecified site: Secondary | ICD-10-CM

## 2011-02-13 DIAGNOSIS — M542 Cervicalgia: Secondary | ICD-10-CM

## 2011-02-13 MED ORDER — HYDROCODONE-ACETAMINOPHEN 5-500 MG PO TABS: 1.0000 | ORAL_TABLET | Freq: Four times a day (QID) | ORAL | Status: DC | PRN

## 2011-02-13 NOTE — Telephone Encounter (Signed)
Rx faxed to [facility] 

## 2011-02-15 ENCOUNTER — Other Ambulatory Visit: Payer: Self-pay

## 2011-02-15 DIAGNOSIS — M542 Cervicalgia: Secondary | ICD-10-CM

## 2011-02-15 DIAGNOSIS — M1990 Unspecified osteoarthritis, unspecified site: Secondary | ICD-10-CM

## 2011-02-15 MED ORDER — HYDROCODONE-ACETAMINOPHEN 5-500 MG PO TABS: 1.0000 | ORAL_TABLET | Freq: Four times a day (QID) | ORAL | Status: DC | PRN

## 2011-02-15 NOTE — Telephone Encounter (Signed)
Rx for Vicodin phoned in to [facility].  Pollyann Glen, LPN at [facility] notes that the pharmacy used for the facility will change next month

## 2011-02-24 ENCOUNTER — Emergency Department (HOSPITAL_COMMUNITY)
Admission: EM | Admit: 2011-02-24 | Discharge: 2011-02-24 | Disposition: A | Discharge status: Home / Self Care | Payer: Medicare Other | Attending: Emergency Medicine | Admitting: Emergency Medicine

## 2011-02-24 ENCOUNTER — Emergency Department (HOSPITAL_COMMUNITY): Payer: Medicare Other

## 2011-02-24 DIAGNOSIS — E7800 Pure hypercholesterolemia, unspecified: Secondary | ICD-10-CM | POA: Insufficient documentation

## 2011-02-24 DIAGNOSIS — M129 Arthropathy, unspecified: Secondary | ICD-10-CM | POA: Insufficient documentation

## 2011-02-24 DIAGNOSIS — E039 Hypothyroidism, unspecified: Secondary | ICD-10-CM | POA: Insufficient documentation

## 2011-02-24 DIAGNOSIS — M1990 Unspecified osteoarthritis, unspecified site: Secondary | ICD-10-CM | POA: Insufficient documentation

## 2011-02-24 DIAGNOSIS — M542 Cervicalgia: Secondary | ICD-10-CM | POA: Insufficient documentation

## 2011-02-24 DIAGNOSIS — M25569 Pain in unspecified knee: Secondary | ICD-10-CM | POA: Insufficient documentation

## 2011-02-24 DIAGNOSIS — G44209 Tension-type headache, unspecified, not intractable: Secondary | ICD-10-CM | POA: Insufficient documentation

## 2011-02-24 DIAGNOSIS — M47812 Spondylosis without myelopathy or radiculopathy, cervical region: Secondary | ICD-10-CM | POA: Insufficient documentation

## 2011-02-24 LAB — URINALYSIS, ROUTINE W REFLEX MICROSCOPIC
Glucose, UA: NEGATIVE mg/dL
Hgb urine dipstick: NEGATIVE
Specific Gravity, Urine: 1.023 (ref 1.005–1.030)

## 2011-02-26 ENCOUNTER — Telehealth: Payer: Self-pay

## 2011-02-26 MED ORDER — LEVOTHYROXINE SODIUM 150 MCG PO TABS: 150.0000 ug | ORAL_TABLET | Freq: Every day | ORAL | Status: DC

## 2011-02-26 NOTE — Telephone Encounter (Signed)
Done

## 2011-03-25 ENCOUNTER — Other Ambulatory Visit: Payer: Self-pay | Admitting: *Deleted

## 2011-03-25 DIAGNOSIS — M1990 Unspecified osteoarthritis, unspecified site: Secondary | ICD-10-CM

## 2011-03-25 DIAGNOSIS — M542 Cervicalgia: Secondary | ICD-10-CM

## 2011-03-25 MED ORDER — HYDROCODONE-ACETAMINOPHEN 5-500 MG PO TABS: 1.0000 | ORAL_TABLET | Freq: Four times a day (QID) | ORAL | Status: DC | PRN

## 2011-03-25 NOTE — Telephone Encounter (Signed)
Spoke with facility Pt has not received Rx from any other provider beside Dr Beverely Low. Rx sent to pharmacy.

## 2011-03-25 NOTE — Telephone Encounter (Signed)
Last ov 01-24-11, last filled  02-15-11  #120

## 2011-03-25 NOTE — Telephone Encounter (Signed)
Ok for #120- please check and make sure she is not getting meds from different physician.  Got a note from neurosurg that recommended she see their pain management doctor.

## 2011-03-28 ENCOUNTER — Other Ambulatory Visit: Payer: Self-pay

## 2011-03-28 MED ORDER — DONEPEZIL HCL 10 MG PO TABS: 10.0000 mg | ORAL_TABLET | Freq: Every evening | ORAL | Status: DC | PRN

## 2011-03-28 MED ORDER — ROSUVASTATIN CALCIUM 20 MG PO TABS: 20.0000 mg | ORAL_TABLET | Freq: Every day | ORAL | Status: DC

## 2011-03-28 NOTE — Telephone Encounter (Signed)
Scripts faxed to [employer]

## 2011-04-09 ENCOUNTER — Other Ambulatory Visit: Payer: Self-pay | Admitting: Family Medicine

## 2011-04-09 NOTE — Telephone Encounter (Signed)
Prior Auth needed for medication

## 2011-04-16 ENCOUNTER — Telehealth: Payer: Self-pay

## 2011-04-16 NOTE — Telephone Encounter (Signed)
Pt's granddaughter, [name], would like to talk with Dr. Beverely Low about POA/guardianship for pt.

## 2011-04-17 NOTE — Telephone Encounter (Signed)
Please let her know that I may not be able to call for a few days due to my schedule but I will do my best

## 2011-04-17 NOTE — Telephone Encounter (Signed)
Left message for granddaughter, [name]

## 2011-05-13 ENCOUNTER — Encounter (HOSPITAL_COMMUNITY): Payer: Self-pay

## 2011-05-13 ENCOUNTER — Emergency Department (INDEPENDENT_AMBULATORY_CARE_PROVIDER_SITE_OTHER): Payer: Medicare Other

## 2011-05-13 ENCOUNTER — Emergency Department (INDEPENDENT_AMBULATORY_CARE_PROVIDER_SITE_OTHER)
Admission: EM | Admit: 2011-05-13 | Discharge: 2011-05-13 | Disposition: A | Discharge status: Home / Self Care | Payer: Medicare Other | Source: Home / Self Care | Attending: Emergency Medicine | Admitting: Emergency Medicine

## 2011-05-13 DIAGNOSIS — J40 Bronchitis, not specified as acute or chronic: Secondary | ICD-10-CM

## 2011-05-13 MED ORDER — AMOXICILLIN-POT CLAVULANATE 875-125 MG PO TABS: 1.0000 | ORAL_TABLET | Freq: Two times a day (BID) | ORAL | Status: AC

## 2011-05-13 MED ORDER — BENZONATATE 200 MG PO CAPS: 200.0000 mg | ORAL_CAPSULE | Freq: Three times a day (TID) | ORAL | Status: AC | PRN

## 2011-05-13 MED ORDER — ALBUTEROL SULFATE HFA 108 (90 BASE) MCG/ACT IN AERS: 1.0000 | INHALATION_SPRAY | Freq: Four times a day (QID) | RESPIRATORY_TRACT | Status: DC | PRN

## 2011-05-13 NOTE — ED Provider Notes (Signed)
History     CSN: [id number] Arrival date & time: 05/13/2011  6:42 PM   First MD Initiated Contact with Patient 05/13/11 1917      Chief Complaint  Patient presents with  . Cough    Started 1 week ago, with yellowish-green sputum.      (Consider location/radiation/quality/duration/timing/severity/associated sxs/prior treatment) HPI Comments: Mallory Burke is a 77 year old female who has multiple medical problems and is on many medications. Over the past 2 weeks she's had a cough productive of green sputum, epistaxis, wheezing, shortness of breath, sweats, and possibly some fever. She's also had sore throat, nasal congestion with green drainage, and diarrhea.    Patient is a 77 y.o. female presenting with cough.  Cough Associated symptoms include chills, rhinorrhea, sore throat, shortness of breath and wheezing. Pertinent negatives include no ear pain and no eye redness.    Past Medical History  Diagnosis Date  . Arthritis   . Heart murmur   . Chicken pox   . Hyperlipidemia   . Depression   . Hypothyroid     Past Surgical History  Procedure Date  . Back surgery     x3, fusion and rod  . Abdominal hysterectomy   . Knee arthroscopy 2007    right  . Abdominal surgery     Family History  Problem Relation Age of Onset  . Arthritis    . Hyperlipidemia    . Hypertension      History  Substance Use Topics  . Smoking status: Never Smoker   . Smokeless tobacco: Not on file  . Alcohol Use: No    OB History    Grav Para Term Preterm Abortions TAB SAB Ect Mult Living                  Review of Systems  Constitutional: Positive for fever and chills. Negative for fatigue.  HENT: Positive for congestion, sore throat and rhinorrhea. Negative for ear pain, sneezing, neck stiffness, voice change and postnasal drip.   Eyes: Negative for pain, discharge and redness.  Respiratory: Positive for cough, shortness of breath and wheezing. Negative for chest tightness.     Gastrointestinal: Positive for diarrhea. Negative for nausea, vomiting and abdominal pain.  Skin: Negative for rash.    Allergies  Review of patient's allergies indicates no known allergies.  Home Medications   Current Outpatient Rx  Name Route Sig Dispense Refill  . AMBULATORY NON FORMULARY MEDICATION  Physical Therapy:  PT/OT/ST DX: back pain, limited mobility 1 Mutually Defined 0  . CITALOPRAM HYDROBROMIDE 10 MG PO TABS Oral Take 10 mg by mouth daily.      . DONEPEZIL HCL 10 MG PO TABS Oral Take 1 tablet (10 mg total) by mouth at bedtime as needed. 30 tablet 5  . HYDROCODONE-ACETAMINOPHEN 5-500 MG PO TABS Oral Take 1 tablet by mouth every 6 (six) hours as needed for pain. 120 tablet 0  . HYDROCODONE-ACETAMINOPHEN 5-500 MG PO TABS Oral Take 1 tablet by mouth every 6 (six) hours as needed for pain. 120 tablet 0  . LEVOTHYROXINE SODIUM 150 MCG PO TABS Oral Take 1 tablet (150 mcg total) by mouth daily. 30 tablet 11  . LORAZEPAM 0.5 MG PO TABS Oral Take 0.5 mg by mouth 3 (three) times daily.      Marland Kitchen METHOCARBAMOL 750 MG PO TABS Oral Take 750 mg by mouth at bedtime as needed.      . MOMETASONE FUROATE 50 MCG/ACT NA SUSP Nasal Place  2 sprays into the nose daily. 17 g 2  . NAPROXEN 500 MG PO TABS  TAKE 1 TABLET TWICE A DAY 60 tablet 0  . ROSUVASTATIN CALCIUM 20 MG PO TABS Oral Take 1 tablet (20 mg total) by mouth daily. 30 tablet 5  . ACETAMINOPHEN 325 MG PO TABS Oral Take 650 mg by mouth every 4 (four) hours as needed.      . ALBUTEROL SULFATE HFA 108 (90 BASE) MCG/ACT IN AERS Inhalation Inhale 1-2 puffs into the lungs every 6 (six) hours as needed for wheezing. 1 Inhaler 0  . AMOXICILLIN-POT CLAVULANATE 875-125 MG PO TABS Oral Take 1 tablet by mouth 2 (two) times daily. 20 tablet 0  . BENZONATATE 200 MG PO CAPS Oral Take 1 capsule (200 mg total) by mouth 3 (three) times daily as needed for cough. 30 capsule 0  . DICLOFENAC SODIUM 1.5 % TD SOLN Transdermal Place onto the skin as needed.       Marland Kitchen HYDROCHLOROTHIAZIDE 12.5 MG PO CAPS Oral Take 12.5 mg by mouth daily.     Marland Kitchen MUPIROCIN 2 % EX OINT Topical Apply topically 3 (three) times daily.     Marland Kitchen NAPROXEN 500 MG PO TABS Oral Take 1 tablet (500 mg total) by mouth 2 (two) times daily with a meal. 60 tablet 0  . OFLOXACIN 0.3 % OT SOLN  10 gtts in R ear qd 10 mL 0  . EX-LAX PO Oral Take by mouth as needed.        BP 146/77  Pulse 75  Temp(Src) 97.9 F (36.6 C) (Oral)  Resp 18  SpO2 98%  Physical Exam  Nursing note and vitals reviewed. Constitutional: She appears well-developed and well-nourished. No distress.  HENT:  Head: Normocephalic and atraumatic.  Right Ear: External ear normal.  Left Ear: External ear normal.  Nose: Nose normal.  Mouth/Throat: Oropharynx is clear and moist. No oropharyngeal exudate.  Eyes: Conjunctivae and EOM are normal. Pupils are equal, round, and reactive to light. Right eye exhibits no discharge. Left eye exhibits no discharge.  Neck: Normal range of motion. Neck supple.  Cardiovascular: Normal rate, regular rhythm and normal heart sounds.   Pulmonary/Chest: Effort normal and breath sounds normal. No stridor. No respiratory distress. She has no wheezes. She has no rales. She exhibits no tenderness.  Lymphadenopathy:    She has no cervical adenopathy.  Skin: Skin is warm and dry. No rash noted. She is not diaphoretic.    ED Course  Procedures (including critical care time)  Labs Reviewed - No data to display Dg Chest 2 View  05/13/2011  *RADIOLOGY REPORT*  Clinical Data: Cough  CHEST - 2 VIEW  Comparison: None.  Findings: Heart is upper normal in size.  Bibasilar linear atelectasis.  No definite consolidation.  No pleural effusion or pneumothorax.  IMPRESSION: Bibasilar atelectasis.  Original Report Authenticated By: Donavan Burnet, M.D.     1. Bronchitis    Today's x.rays showed the following:  DG CHEST 2 VIEW   Final Result:        The patient was sent home with the following  meds:   Mallory Burke  Home Medication Instructions HAR:   Printed on:05/13/11 2134  Medication Information                    AMBULATORY NON FORMULARY MEDICATION Physical Therapy:  PT/OT/ST DX: back pain, limited mobility           citalopram (CELEXA) 10 MG  tablet Take 10 mg by mouth daily.             donepezil (ARICEPT) 10 MG tablet Take 1 tablet (10 mg total) by mouth at bedtime as needed.           HYDROcodone-acetaminophen (VICODIN) 5-500 MG per tablet Take 1 tablet by mouth every 6 (six) hours as needed for pain.           HYDROcodone-acetaminophen (VICODIN) 5-500 MG per tablet Take 1 tablet by mouth every 6 (six) hours as needed for pain.           levothyroxine (LEVOTHROID) 150 MCG tablet Take 1 tablet (150 mcg total) by mouth daily.           LORazepam (ATIVAN) 0.5 MG tablet Take 0.5 mg by mouth 3 (three) times daily.             methocarbamol (ROBAXIN) 750 MG tablet Take 750 mg by mouth at bedtime as needed.             mometasone (NASONEX) 50 MCG/ACT nasal spray Place 2 sprays into the nose daily.           naproxen (NAPROSYN) 500 MG tablet TAKE 1 TABLET TWICE A DAY           rosuvastatin (CRESTOR) 20 MG tablet Take 1 tablet (20 mg total) by mouth daily.           acetaminophen (TYLENOL) 325 MG tablet Take 650 mg by mouth every 4 (four) hours as needed.             albuterol (PROVENTIL HFA;VENTOLIN HFA) 108 (90 BASE) MCG/ACT inhaler Inhale 1-2 puffs into the lungs every 6 (six) hours as needed for wheezing.           amoxicillin-clavulanate (AUGMENTIN) 875-125 MG per tablet Take 1 tablet by mouth 2 (two) times daily.           benzonatate (TESSALON) 200 MG capsule Take 1 capsule (200 mg total) by mouth 3 (three) times daily as needed for cough.           Diclofenac Sodium (PENNSAID) 1.5 % SOLN Place onto the skin as needed.             hydrochlorothiazide (,MICROZIDE/HYDRODIURIL,) 12.5 MG capsule Take 12.5 mg by mouth daily.             mupirocin (BACTROBAN) 2 % ointment Apply topically 3 (three) times daily.            naproxen (NAPROSYN) 500 MG tablet Take 1 tablet (500 mg total) by mouth 2 (two) times daily with a meal.           ofloxacin (FLOXIN) 0.3 % otic solution 10 gtts in R ear qd           Sennosides (EX-LAX PO) Take by mouth as needed.                Side effects were explained to the patient.   MDM          Roque Lias, MD 05/13/11 2135

## 2011-05-13 NOTE — ED Notes (Signed)
Coughing for past week with yellow-green sputum.  C/o fevers. C/o pain in chest from coughing.  Nasal drainage with headache.

## 2011-05-14 ENCOUNTER — Telehealth: Payer: Self-pay | Admitting: Family Medicine

## 2011-05-14 NOTE — Telephone Encounter (Signed)
Manually faxed hard script to [facility] for vicodin

## 2011-05-14 NOTE — Telephone Encounter (Signed)
Patient is out of med vycodin 5/500mg   Mallory Burke [phone]

## 2011-05-27 ENCOUNTER — Encounter: Payer: Self-pay | Admitting: Family Medicine

## 2011-05-27 ENCOUNTER — Ambulatory Visit (INDEPENDENT_AMBULATORY_CARE_PROVIDER_SITE_OTHER): Payer: Medicare Other | Admitting: Family Medicine

## 2011-05-27 DIAGNOSIS — J069 Acute upper respiratory infection, unspecified: Secondary | ICD-10-CM

## 2011-05-27 DIAGNOSIS — M1990 Unspecified osteoarthritis, unspecified site: Secondary | ICD-10-CM

## 2011-05-27 DIAGNOSIS — M542 Cervicalgia: Secondary | ICD-10-CM

## 2011-05-27 MED ORDER — LORAZEPAM 0.5 MG PO TABS: 0.5000 mg | ORAL_TABLET | Freq: Three times a day (TID) | ORAL | Status: DC

## 2011-05-27 MED ORDER — HYDROCODONE-ACETAMINOPHEN 5-500 MG PO TABS: 1.0000 | ORAL_TABLET | Freq: Four times a day (QID) | ORAL | Status: DC | PRN

## 2011-05-29 ENCOUNTER — Telehealth: Payer: Self-pay | Admitting: *Deleted

## 2011-05-29 NOTE — Telephone Encounter (Signed)
Warm compresses to area ---soak in tub if she is able to.   F/u with Dr Beverely Low Monday or go to UC over weekend.

## 2011-05-29 NOTE — Telephone Encounter (Signed)
Nurse called to report that Pt has walnut sized cyst on her labia. Pt is refusing to put on underwear because it is irritated knot. Nurse notes that she has already inform Pt to go to ED and Pt has refused to go. Advise nurse to see if Pt is willing to go to a UC. Nurse states that she will inform Pt of that and that Dr Beverely Low is out of office today.

## 2011-05-29 NOTE — Telephone Encounter (Signed)
Spoke with nurse who states that Pt is unable to soak in tub and they have contacted Pt  grand-daughter and she is going to take Pt to UC today when she gets off work.

## 2011-06-07 ENCOUNTER — Other Ambulatory Visit: Payer: Self-pay | Admitting: *Deleted

## 2011-06-07 NOTE — Telephone Encounter (Signed)
Spoke to [place] concerning medication orders for pt for medications proventil-tessalon pearls advised to nurse that MD Beverely Low did not prescribe the medications they were noted in the system to be prescribed by [facility] advised contact number for nurse to contact for orders.

## 2011-06-11 NOTE — Patient Instructions (Signed)
Call neurosurg and get appt set up for procedure Call w/ any questions or concerns Hang in there

## 2011-06-11 NOTE — Assessment & Plan Note (Signed)
Improved w/ augmentin.  Currently asymptomatic.  Diarrhea from abx is resolving.

## 2011-06-11 NOTE — Progress Notes (Signed)
  Subjective:    Patient ID: Mallory Burke, female    DOB: May 17, 1933, 77 y.o.   MRN: [id number]  HPI Bronchitis- pt was seen in ER 11/5 for bronchitis and started on Augmentin.  Pt reports she is no longer coughing, having nasal drainage or SOB.  Neck pain- pt is very hostile and aggressive today.  Is yelling at both me and her daughter about her pain and accusing [place] of not being willing to help her.  Neurosurgery wanted to pursue intervention but family had questions first- this has not been scheduled.  Pt asking why I'm not doing anything.  Again needs refill of pain medication according to NH.   Review of Systems See above    Objective:   Physical Exam  Vitals reviewed. Constitutional: She appears well-developed and well-nourished. She appears distressed (angry).  HENT:  Head: Normocephalic and atraumatic.  Nose: Nose normal.  Mouth/Throat: Oropharynx is clear and moist. No oropharyngeal exudate.       TMs normal bilaterally No TTP over sinuses  Cardiovascular: Normal rate, regular rhythm, normal heart sounds and intact distal pulses.   Pulmonary/Chest: Effort normal and breath sounds normal. No respiratory distress. She has no wheezes. She has no rales.       No cough heard  Lymphadenopathy:    She has no cervical adenopathy.  Skin: Skin is warm and dry.  Psychiatric:       Labile mood/affect- alternating between hostile and aggressive and then calm and understanding.  Has thoughts of being persecuted/neglected today.  Angry at both daughter and me.          Assessment & Plan:

## 2011-06-11 NOTE — Assessment & Plan Note (Addendum)
Her neck pain is what has pt most upset.  Feels that the whole process is moving too slowly.  Yelling at both me and her daughter- telling us we don't understand her pain.  Reviewed what pain meds she has available.  Reviewed neurosurg's recommendations- pt would like to proceed w/ procedure.  Daughter to call and schedule appt.  Showed pt the [facility] faxes I have responded to on her behalf in order to continue/improve her care- this settled her somewhat.  Pt has never acted like this previously.  Leads me to questions whether her mental status is sound.  Discussed this w/ daughter.  Will follow closely.  Total time spent w/ pt and daugher- 28 minutes, >50% spent counseling

## 2011-06-24 ENCOUNTER — Other Ambulatory Visit: Payer: Self-pay | Admitting: *Deleted

## 2011-06-24 NOTE — Telephone Encounter (Signed)
Fax requested to send a RX for lorazepam 0.5mg  to pharmacy for pt, noted rx sent on 05-27-11 #30 with 5 refills.spoke to sheila who advised that she will check into the issue and call back if any concern

## 2011-07-25 ENCOUNTER — Telehealth: Payer: Self-pay | Admitting: *Deleted

## 2011-07-25 MED ORDER — LORAZEPAM 0.5 MG PO TABS: 0.5000 mg | ORAL_TABLET | Freq: Three times a day (TID) | ORAL | Status: DC

## 2011-07-25 NOTE — Telephone Encounter (Signed)
Spoke to head nurse at facility advising that pt has not received her ativan and our office had been called on 4 times today, apologized to nurse for inconvience, only noted one call today at 12 for pt medication and that 5 refills of medication had been sent for pt on 05-27-11, nurse did not think that her pharmacy allowed a fax, I advised this is standard procedure for office to send via fax hard/signed script, spoke with MD Tabori and advised the medication had bent sent for only #30 and needed to be sent for #90, placed change in system for #90 with 5 refills per MD Tabori verbal order, nurse called her pharmacy and they advised they did received the refills from our office and was not sure why they had not been sent however per the wrong # of tablets sent, a new hard script was faxed to pharmacy fax number clarified by nurse, [phone] she did clarify with her pharmacy that they will accept via fax, nurse satisfied with outcome

## 2011-08-12 ENCOUNTER — Telehealth: Payer: Self-pay | Admitting: *Deleted

## 2011-08-12 MED ORDER — CEPHALEXIN 500 MG PO CAPS: 500.0000 mg | ORAL_CAPSULE | Freq: Two times a day (BID) | ORAL | Status: AC

## 2011-08-12 NOTE — Telephone Encounter (Signed)
MD Beverely Low reviewed  Labs to advise that pt needs to start Keflex 500mg  BID X 5 days #10 with no refills , sent via escribe to pharmacy and called pt home ([facility]) to advise nurse that pt has UTI and medication has been sent to pharmacy, spoke to [facility] and he noted the order and requested that the order be sent to their fax number, [phone], sent via fax as requested

## 2011-08-15 ENCOUNTER — Other Ambulatory Visit: Payer: Self-pay | Admitting: *Deleted

## 2011-08-15 DIAGNOSIS — M542 Cervicalgia: Secondary | ICD-10-CM

## 2011-08-15 DIAGNOSIS — M1990 Unspecified osteoarthritis, unspecified site: Secondary | ICD-10-CM

## 2011-08-15 NOTE — Telephone Encounter (Signed)
Ok for #120 of whatever dose we gave her last time

## 2011-08-15 NOTE — Telephone Encounter (Signed)
Called [facility] and spoke to [place] the med tech in charge of Pt and she advised that MD Beverely Low had sent an order for the Hydrocodone 10-325, however advised that we have no information stating that this medication has been sent from our office, Wallene Huh advised she will fax [place] the order, she stated she has our fax number already.

## 2011-08-15 NOTE — Telephone Encounter (Signed)
Please call nursing home and clarify why they are changing the amount of hydrocodone in pt's med.  We don't just change from 5 to 10 mg w/out reason.

## 2011-08-15 NOTE — Telephone Encounter (Signed)
Received an incoming fax from facility [facility] asking for a hard script on Hydrocodone-Acetaminophen 10-325mg  tablet one tablet by mouth Q6hrs as needed to [name] as well as one sent to the facility, noted pthas never been prescribed Hydrocodone-Acetaminophin 10-325mg  per has not been noted in Epic nor [employer] pt is prescribed RX for Hydrocodone-Vicodin 5-500mg  last sent on 05-27-11 #120 with 0 refills, alerted MD Tabori via written note, please advise

## 2011-08-16 ENCOUNTER — Encounter: Payer: Self-pay | Admitting: Family Medicine

## 2011-08-20 MED ORDER — HYDROCODONE-ACETAMINOPHEN 5-500 MG PO TABS: 1.0000 | ORAL_TABLET | Freq: Four times a day (QID) | ORAL | Status: DC | PRN

## 2011-08-20 NOTE — Telephone Encounter (Signed)
Then please refill the 5mg  since that's what we have on her med list.

## 2011-08-20 NOTE — Telephone Encounter (Signed)
No fax noted as of today 08-20-11 advising pt need for change of medication to Hydrocodone 10-325mg , per facility noted they would send the order/refill noted from our office via fax on 08-15-11, FYI

## 2011-08-20 NOTE — Telephone Encounter (Signed)
.  rx faxed to pharmacy, manually to [facility] for Hydrocodone 5-500mg  #120 no refills

## 2011-09-09 ENCOUNTER — Emergency Department (HOSPITAL_COMMUNITY): Payer: Medicare Other

## 2011-09-09 ENCOUNTER — Emergency Department (HOSPITAL_COMMUNITY)
Admission: EM | Admit: 2011-09-09 | Discharge: 2011-09-09 | Disposition: A | Discharge status: Home / Self Care | Payer: Medicare Other | Attending: Emergency Medicine | Admitting: Emergency Medicine

## 2011-09-09 ENCOUNTER — Encounter (HOSPITAL_COMMUNITY): Payer: Self-pay | Admitting: Emergency Medicine

## 2011-09-09 DIAGNOSIS — Z8739 Personal history of other diseases of the musculoskeletal system and connective tissue: Secondary | ICD-10-CM | POA: Insufficient documentation

## 2011-09-09 DIAGNOSIS — M25561 Pain in right knee: Secondary | ICD-10-CM

## 2011-09-09 DIAGNOSIS — F3289 Other specified depressive episodes: Secondary | ICD-10-CM | POA: Insufficient documentation

## 2011-09-09 DIAGNOSIS — E785 Hyperlipidemia, unspecified: Secondary | ICD-10-CM | POA: Insufficient documentation

## 2011-09-09 DIAGNOSIS — E039 Hypothyroidism, unspecified: Secondary | ICD-10-CM | POA: Insufficient documentation

## 2011-09-09 DIAGNOSIS — M25569 Pain in unspecified knee: Secondary | ICD-10-CM | POA: Insufficient documentation

## 2011-09-09 DIAGNOSIS — K029 Dental caries, unspecified: Secondary | ICD-10-CM | POA: Insufficient documentation

## 2011-09-09 DIAGNOSIS — J40 Bronchitis, not specified as acute or chronic: Secondary | ICD-10-CM | POA: Insufficient documentation

## 2011-09-09 DIAGNOSIS — Z79899 Other long term (current) drug therapy: Secondary | ICD-10-CM | POA: Insufficient documentation

## 2011-09-09 DIAGNOSIS — F329 Major depressive disorder, single episode, unspecified: Secondary | ICD-10-CM | POA: Insufficient documentation

## 2011-09-09 MED ORDER — AMOXICILLIN 500 MG PO CAPS: 1000.0000 mg | ORAL_CAPSULE | Freq: Two times a day (BID) | ORAL | Status: AC

## 2011-09-09 MED ORDER — AEROCHAMBER PLUS W/MASK MISC
Status: AC
  Filled 2011-09-09: qty 1

## 2011-09-09 MED ORDER — AMOXICILLIN 500 MG PO CAPS
1000.0000 mg | ORAL_CAPSULE | Freq: Once | ORAL | Status: AC
  Administered 2011-09-09: 1000 mg via ORAL
  Filled 2011-09-09 (×2): qty 1

## 2011-09-09 MED ORDER — OXYCODONE-ACETAMINOPHEN 5-325 MG PO TABS
2.0000 | ORAL_TABLET | Freq: Once | ORAL | Status: AC
  Administered 2011-09-09: 2 via ORAL
  Filled 2011-09-09: qty 2

## 2011-09-09 MED ORDER — ALBUTEROL SULFATE HFA 108 (90 BASE) MCG/ACT IN AERS
2.0000 | INHALATION_SPRAY | Freq: Once | RESPIRATORY_TRACT | Status: AC
  Administered 2011-09-09: 2 via RESPIRATORY_TRACT
  Filled 2011-09-09: qty 6.7

## 2011-09-09 NOTE — ED Notes (Signed)
PT. REPORTS PRODUCTIVE COUGH / NASAL CONGESTION FOR 2 WEEKS , ALSO REPORTS "BROKE"  LEFT LOWER MOLAR LAST WEEK.

## 2011-09-09 NOTE — ED Provider Notes (Signed)
Patient presents with one to 2 weeks of nasal congestion and a cough with normal room pulse oximetry, she also fractured off her left maxillary lateral incisor from decay with localized minimal gingival tenderness without swelling or purulent drainage.  I saw and evaluated the patient, reviewed the resident's note and I agree with the findings and plan.  Hurman Horn, MD 09/10/11 2212

## 2011-09-09 NOTE — Discharge Instructions (Signed)
Complete your full course of antibiotics for your possible dental abscess (tooth infection).  Use your inhaler every 2-4 hours as needed for cough or shortness of breath.  See your doctor if your symptoms worsen or for other concerns.    Bronchitis Bronchitis is a problem of the air tubes leading to your lungs. This problem makes it hard for air to get in and out of the lungs. You may cough a lot because your air tubes are narrow. Going without care can cause lasting (chronic) bronchitis. HOME CARE   Drink enough fluids to keep your pee (urine) clear or pale yellow.   Use a cool mist humidifier.   Quit smoking if you smoke. If you keep smoking, the bronchitis might not get better.   Only take medicine as told by your doctor.  GET HELP RIGHT AWAY IF:   Coughing keeps you awake.   You start to wheeze.   You become more sick or weak.   You have a hard time breathing or get short of breath.   You cough up blood.   Coughing lasts more than 2 weeks.   You have a fever.   Your baby is older than 3 months with a rectal temperature of 102 F (38.9 C) or higher.   Your baby is 3 months old or younger with a rectal temperature of 100.4 F (38 C) or higher.  MAKE SURE YOU:  Understand these instructions.   Will watch your condition.   Will get help right away if you are not doing well or get worse.  Document Released: 12/11/2007 Document Revised: 06/13/2011 Document Reviewed: 05/26/2009 [facility] Patient Information 2012 [place], [place].    Dental Abscess A dental abscess usually starts from an infected tooth. Antibiotic medicine and pain pills can be helpful, but dental infections require the attention of a dentist. Rinse around the infected area often with salt water (a pinch of salt in 8 oz of warm water). Do not apply heat to the outside of your face. See your dentist or oral surgeon as soon as possible.  SEEK IMMEDIATE MEDICAL CARE IF:  You have increasing, severe pain  that is not relieved by medicine.   You or your child has an oral temperature above 102 F (38.9 C), not controlled by medicine.   Your baby is older than 3 months with a rectal temperature of 102 F (38.9 C) or higher.   Your baby is 68 months old or younger with a rectal temperature of 100.4 F (38 C) or higher.   You develop chills, severe headache, difficulty breathing, or trouble swallowing.   You have swelling in the neck or around the eye.  Document Released: 06/24/2005 Document Revised: 06/13/2011 Document Reviewed: 12/03/2006 [facility] Patient Information 2012 [place], [place].

## 2011-09-09 NOTE — ED Provider Notes (Signed)
History     CSN: [id number]  Arrival date & time 09/09/11  1715   First MD Initiated Contact with Patient 09/09/11 2138      Chief Complaint  Patient presents with  . Cough    (Consider location/radiation/quality/duration/timing/severity/associated sxs/prior treatment) HPI History provided by the patient. History the patient mildly limited secondary to poor historian.  78 year old female presenting with complaint of "cold" and cough.  Patient's symptoms began gradually about 3 weeks ago, initially with nasal congestion, rhinorrhea, and mildly sore throat. She then developed a cough about 2 weeks ago which has been productive of frequent sputum, relatively constant, moderate, and occasionally associated with mild shortness of breath and sweats.  The patient denies associated fever, chills, chest pain, abdominal pain, nausea, or vomiting. No associated headache. Patient lives in an assisted living facility but cannot report the recent sick contacts.    Patient has had similar symptoms in the past. This has not been seen recently by another physician for her current complaints.    Past Medical History  Diagnosis Date  . Arthritis   . Heart murmur   . Chicken pox   . Hyperlipidemia   . Depression   . Hypothyroid     Past Surgical History  Procedure Date  . Back surgery     x3, fusion and rod  . Abdominal hysterectomy   . Knee arthroscopy 2007    right  . Abdominal surgery     Family History  Problem Relation Age of Onset  . Arthritis    . Hyperlipidemia    . Hypertension      History  Substance Use Topics  . Smoking status: Never Smoker   . Smokeless tobacco: Not on file  . Alcohol Use: No    OB History    Grav Para Term Preterm Abortions TAB SAB Ect Mult Living                  Review of Systems  Constitutional: Positive for diaphoresis. Negative for fever and chills.  HENT: Positive for congestion, sore throat (mild), rhinorrhea and dental problem (  patiennt also reports that she "broke" her left upper tooth (unlike noted in traige note) about one week ago with mild occasional pain. ).   Eyes: Negative for pain and visual disturbance.  Respiratory: Positive for cough and shortness of breath. Negative for wheezing.   Cardiovascular: Negative for chest pain and palpitations.  Gastrointestinal: Negative for nausea, vomiting, abdominal pain, diarrhea and blood in stool.  Genitourinary: Negative for dysuria and hematuria.  Musculoskeletal: Positive for gait problem (none greater than baseline). Negative for back pain.  Skin: Negative for rash and wound.  Neurological: Negative for dizziness and headaches.  Psychiatric/Behavioral: Negative for confusion and agitation.  All other systems reviewed and are negative.    Allergies  Review of patient's allergies indicates no known allergies.  Home Medications   Current Outpatient Rx  Name Route Sig Dispense Refill  . ALBUTEROL SULFATE HFA 108 (90 BASE) MCG/ACT IN AERS Inhalation Inhale 2 puffs into the lungs every 4 (four) hours as needed. For cough    . BENZONATATE 200 MG PO CAPS Oral Take 200 mg by mouth every 8 (eight) hours as needed. For cough    . CELECOXIB 200 MG PO CAPS Oral Take 200 mg by mouth 2 (two) times daily.    Marland Kitchen CITALOPRAM HYDROBROMIDE 10 MG PO TABS Oral Take 10 mg by mouth daily.      . DONEPEZIL HCL  10 MG PO TABS Oral Take 10 mg by mouth daily.    Marland Kitchen HYDROCHLOROTHIAZIDE 12.5 MG PO CAPS Oral Take 12.5 mg by mouth daily.     Marland Kitchen HYDROCODONE-ACETAMINOPHEN 10-325 MG PO TABS Oral Take 1 tablet by mouth every 6 (six) hours as needed. For pain    . HYDROCORTISONE 2.5 % EX CREA Topical Apply topically 2 (two) times daily. For irritation    . LEVOTHYROXINE SODIUM 150 MCG PO TABS Oral Take 150 mcg by mouth daily.    Marland Kitchen LOPERAMIDE HCL 2 MG PO CAPS Oral Take 2 mg by mouth every 6 (six) hours as needed. For diarrhea    . LORAZEPAM 0.5 MG PO TABS Oral Take 0.5 mg by mouth 3 (three) times  daily.    Marland Kitchen METHOCARBAMOL 750 MG PO TABS Oral Take 750 mg by mouth at bedtime as needed.      . MOMETASONE FUROATE 50 MCG/ACT NA SUSP Nasal Place 2 sprays into the nose daily.    Marland Kitchen OMEPRAZOLE 20 MG PO CPDR Oral Take 20 mg by mouth daily.    Marland Kitchen ROSUVASTATIN CALCIUM 20 MG PO TABS Oral Take 20 mg by mouth daily.    . TRAMADOL HCL 50 MG PO TABS Oral Take 50 mg by mouth every 8 (eight) hours as needed. For pain      BP 140/80  Pulse 70  Temp(Src) 98.2 F (36.8 C) (Oral)  Resp 16  SpO2 98%  Physical Exam  Nursing note and vitals reviewed. Constitutional: She is oriented to person, place, and time.       Mildly obese, alert, family at bedside  HENT:  Head: Normocephalic and atraumatic.  Right Ear: External ear normal.  Left Ear: External ear normal.  Nose: Nose normal.  Mouth/Throat: Oropharynx is clear and moist.       Poor dentition; Lt upper incisor with sig carries and old-appearing fx with mild assoc TTP without appreciated gingival abscess.  Eyes: Conjunctivae and EOM are normal. Pupils are equal, round, and reactive to light.  Neck: Normal range of motion. Neck supple.  Cardiovascular: Normal rate, regular rhythm and intact distal pulses.   No murmur heard. Pulmonary/Chest: Effort normal and breath sounds normal. No respiratory distress.  Abdominal: Soft. Bowel sounds are normal. There is no tenderness.       Obese   Musculoskeletal: She exhibits tenderness (mild of Rt knee). She exhibits no edema.  Neurological: She is alert and oriented to person, place, and time.  Skin: Skin is warm and dry. No rash noted. She is not diaphoretic.  Psychiatric: She has a normal mood and affect. Judgment normal.    ED Course  Procedures (including critical care time)  Labs Reviewed - No data to display Dg Chest 2 View  09/09/2011  *RADIOLOGY REPORT*  Clinical Data: Cough.  CHEST - 2 VIEW  Comparison: 05/13/2011  Findings: Tortuous thoracic aorta noted along with aortic atherosclerotic  calcification.  Linear scarring noted in the lingula.  The right lung remains clear.  Thoracic spondylosis noted.  IMPRESSION:  1.  Stable radiographic appearance of the chest, with mild lingular scarring but no acute thoracic findings. 2.  Atherosclerosis.  Original Report Authenticated By: Dellia Cloud, M.D.     1. Bronchitis   2. Dental caries   3. Knee pain, right   4.  Possible periapical abscess     MDM  78 year old female presenting with complaint of URI symptoms and cough for 2-3 weeks. No associated fever or chest  pain.  Exam is above, AF/VSS with lowest O2 sat noted only briefly to 92%, clear lungs.  Chest x-ray negative for pneumonia.  History not consistent with ACS or PE. Bronchitis likely. Exam also concerning for possible periapical abscess at the site and recently fractured tooth. Albuterol ordered for the patient's cough, Percocet for her chronic arthritis pain, and amoxicillin for possible periapical abscess.  Patient well-appearing at time of discharge; will have her followup with her PCP.      Particia Lather, MD 09/09/11 2314

## 2011-09-09 NOTE — ED Notes (Signed)
Having cough about 3-4 weeks, productive yellow to green in color and thick, denies any SOB.

## 2011-10-01 DIAGNOSIS — Z0279 Encounter for issue of other medical certificate: Secondary | ICD-10-CM

## 2011-10-11 ENCOUNTER — Emergency Department (HOSPITAL_COMMUNITY)
Admission: EM | Admit: 2011-10-11 | Discharge: 2011-10-11 | Disposition: A | Discharge status: Skilled Nursing Facility | Payer: Medicare Other | Attending: Emergency Medicine | Admitting: Emergency Medicine

## 2011-10-11 ENCOUNTER — Encounter (HOSPITAL_COMMUNITY): Payer: Self-pay | Admitting: Emergency Medicine

## 2011-10-11 DIAGNOSIS — R5381 Other malaise: Secondary | ICD-10-CM | POA: Insufficient documentation

## 2011-10-11 DIAGNOSIS — E785 Hyperlipidemia, unspecified: Secondary | ICD-10-CM | POA: Insufficient documentation

## 2011-10-11 DIAGNOSIS — R197 Diarrhea, unspecified: Secondary | ICD-10-CM | POA: Insufficient documentation

## 2011-10-11 DIAGNOSIS — R42 Dizziness and giddiness: Secondary | ICD-10-CM | POA: Insufficient documentation

## 2011-10-11 DIAGNOSIS — R112 Nausea with vomiting, unspecified: Secondary | ICD-10-CM | POA: Insufficient documentation

## 2011-10-11 DIAGNOSIS — R011 Cardiac murmur, unspecified: Secondary | ICD-10-CM | POA: Insufficient documentation

## 2011-10-11 DIAGNOSIS — E039 Hypothyroidism, unspecified: Secondary | ICD-10-CM | POA: Insufficient documentation

## 2011-10-11 MED ORDER — DIPHENOXYLATE-ATROPINE 2.5-0.025 MG PO TABS: 1.0000 | ORAL_TABLET | Freq: Four times a day (QID) | ORAL | Status: AC | PRN

## 2011-10-11 MED ORDER — SODIUM CHLORIDE 0.9 % IV BOLUS (SEPSIS)
500.0000 mL | Freq: Once | INTRAVENOUS | Status: AC
  Administered 2011-10-11: 500 mL via INTRAVENOUS

## 2011-10-11 MED ORDER — SODIUM CHLORIDE 0.9 % IV SOLN
Freq: Once | INTRAVENOUS | Status: AC
  Administered 2011-10-11: 500 mL via INTRAVENOUS

## 2011-10-11 MED ORDER — ACETAMINOPHEN 325 MG PO TABS
650.0000 mg | ORAL_TABLET | Freq: Once | ORAL | Status: AC
  Administered 2011-10-11: 650 mg via ORAL
  Filled 2011-10-11: qty 2

## 2011-10-11 MED ORDER — DIPHENOXYLATE-ATROPINE 2.5-0.025 MG PO TABS
1.0000 | ORAL_TABLET | Freq: Once | ORAL | Status: AC
  Administered 2011-10-11: 1 via ORAL
  Filled 2011-10-11: qty 1

## 2011-10-11 MED ORDER — ONDANSETRON HCL 4 MG PO TABS: 4.0000 mg | ORAL_TABLET | Freq: Three times a day (TID) | ORAL | Status: AC | PRN

## 2011-10-11 MED ORDER — ONDANSETRON HCL 4 MG/2ML IJ SOLN
4.0000 mg | Freq: Once | INTRAMUSCULAR | Status: AC
  Administered 2011-10-11: 4 mg via INTRAVENOUS
  Filled 2011-10-11: qty 2

## 2011-10-11 MED ORDER — MORPHINE SULFATE 2 MG/ML IJ SOLN
2.0000 mg | Freq: Once | INTRAMUSCULAR | Status: AC
  Administered 2011-10-11: 2 mg via INTRAVENOUS
  Filled 2011-10-11: qty 1

## 2011-10-11 NOTE — ED Notes (Signed)
Pt from [facility].  Pt has had norovirus with emesis and diarrhea.  Pt tolerating po fluids since last night. Pt still having diarrhea.  Pt advised to come here by MD for fluids.  Pt has no compliants, just chronic back pain.

## 2011-10-11 NOTE — Discharge Instructions (Signed)
Please read the information below.  Drink plenty of fluids over the next few days to keep yourself hydrated.  Return to the ER immediately if you develop uncontrolled abdominal pain or vomiting, inability to tolerate fluids by mouth, or signs of dehydration (decreased urination, dry mouth).  You may return to the ER at any time for worsening condition or any new symptoms that concern you.

## 2011-10-11 NOTE — ED Provider Notes (Signed)
History     CSN: [id number]  Arrival date & time 10/11/11  1202   First MD Initiated Contact with Patient 10/11/11 1209      Chief Complaint  Patient presents with  . Diarrhea    (Consider location/radiation/quality/duration/timing/severity/associated sxs/prior treatment) HPI Comments: Patient sent from assisted living facility with confirmed norovirus, with N/V/D.  Patient reports she is feeling a little bit weak and lightheaded when she stands up. States she has had only 2-3 episodes of nonbloody emesis and diarrhea each day, and only one episode today.  Denies fevers.  Denies current nausea.    Patient is a 78 y.o. female presenting with diarrhea. The history is provided by the patient.  Diarrhea The primary symptoms include diarrhea. Primary symptoms do not include fever.    Past Medical History  Diagnosis Date  . Arthritis   . Heart murmur   . Chicken pox   . Hyperlipidemia   . Depression   . Hypothyroid     Past Surgical History  Procedure Date  . Back surgery     x3, fusion and rod  . Abdominal hysterectomy   . Knee arthroscopy 2007    right  . Abdominal surgery     Family History  Problem Relation Age of Onset  . Arthritis    . Hyperlipidemia    . Hypertension      History  Substance Use Topics  . Smoking status: Never Smoker   . Smokeless tobacco: Not on file  . Alcohol Use: No    OB History    Grav Para Term Preterm Abortions TAB SAB Ect Mult Living                  Review of Systems  Constitutional: Negative for fever.  Gastrointestinal: Positive for diarrhea.  All other systems reviewed and are negative.    Allergies  Review of patient's allergies indicates no known allergies.  Home Medications   Current Outpatient Rx  Name Route Sig Dispense Refill  . ALBUTEROL SULFATE HFA 108 (90 BASE) MCG/ACT IN AERS Inhalation Inhale 2 puffs into the lungs every 4 (four) hours as needed. For cough    . BENZONATATE 200 MG PO CAPS Oral Take 200  mg by mouth every 8 (eight) hours as needed. For cough    . CELECOXIB 200 MG PO CAPS Oral Take 200 mg by mouth 2 (two) times daily.    Marland Kitchen CITALOPRAM HYDROBROMIDE 10 MG PO TABS Oral Take 10 mg by mouth daily.      . DONEPEZIL HCL 10 MG PO TABS Oral Take 10 mg by mouth daily.    Marland Kitchen HYDROCHLOROTHIAZIDE 12.5 MG PO CAPS Oral Take 12.5 mg by mouth daily.     Marland Kitchen HYDROCODONE-ACETAMINOPHEN 10-325 MG PO TABS Oral Take 1 tablet by mouth every 6 (six) hours as needed. For pain    . HYDROCORTISONE 2.5 % EX CREA Topical Apply topically 2 (two) times daily. For irritation    . LEVOTHYROXINE SODIUM 150 MCG PO TABS Oral Take 150 mcg by mouth daily.    Marland Kitchen LOPERAMIDE HCL 2 MG PO CAPS Oral Take 2 mg by mouth every 6 (six) hours as needed. For diarrhea    . LORAZEPAM 0.5 MG PO TABS Oral Take 0.5 mg by mouth 3 (three) times daily.    Marland Kitchen METHOCARBAMOL 750 MG PO TABS Oral Take 750 mg by mouth at bedtime as needed.      . MOMETASONE FUROATE 50 MCG/ACT NA SUSP Nasal Place  2 sprays into the nose daily.    Marland Kitchen OMEPRAZOLE 20 MG PO CPDR Oral Take 20 mg by mouth daily.    Marland Kitchen ROSUVASTATIN CALCIUM 20 MG PO TABS Oral Take 20 mg by mouth daily.    . TRAMADOL HCL 50 MG PO TABS Oral Take 50 mg by mouth every 8 (eight) hours as needed. For pain      BP 121/74  Pulse 63  Temp(Src) 98.5 F (36.9 C) (Oral)  Resp 16  SpO2 95%  Physical Exam  Nursing note and vitals reviewed. Constitutional: She is oriented to person, place, and time. She appears well-developed and well-nourished. No distress.  HENT:  Head: Normocephalic and atraumatic.  Neck: Neck supple.  Cardiovascular: Normal rate, regular rhythm and normal heart sounds.   Pulmonary/Chest: Breath sounds normal. No respiratory distress. She has no wheezes. She has no rales. She exhibits no tenderness.  Abdominal: Soft. Bowel sounds are normal. She exhibits no distension and no mass. There is no tenderness. There is no rebound and no guarding.  Neurological: She is alert and  oriented to person, place, and time. She exhibits normal muscle tone.  Skin: She is not diaphoretic.  Psychiatric: She has a normal mood and affect. Her behavior is normal. Judgment and thought content normal.    ED Course  Procedures (including critical care time)  Labs Reviewed - No data to display No results found.  12:44 PM Discussed patient with Dr Roselyn Bering who will also see the patient.   2:11 PM Patient reports continued chronic back pain and mild abdominal discomfort.  No further V/D.  Pt is tolerating PO fluids.  Family is now bedside.  Pt will attempt to eat crackers and if tolerating and pain improved, plan is for d/c home.   1. Nausea vomiting and diarrhea       MDM  Patient with a few days of N/V/D, [facility].  Patient states she is not having many stools or vomiting episodes.  Pt given IVF and symptomatic treatment.  Patient's biggest complaint was of her chronic pain.  Discussed plan for hydration and discharge with patient and family who agree with plan.  Pt d/c back to facility.          Dillard Cannon [place], [place] 10/11/11 2140

## 2011-10-11 NOTE — ED Notes (Signed)
ZOX:[id number]<AV> Expected date:10/11/11<BR> Expected time:<BR> Means of arrival:<BR> Comments:<BR> EMS 32 GC - abd pain

## 2011-10-11 NOTE — ED Provider Notes (Signed)
Medical screening examination/treatment/procedure(s) were conducted as a shared visit with non-physician practitioner(s) and myself.  I personally evaluated the patient during the encounter  Pt with known norovirus.  Complaining of vomiting and diarrhea.  Mild ttp lower abdomen.  Will give iv fluids and check labs.  May be able to discharge if improving  Celene Kras, MD 10/11/11 1325

## 2011-10-29 ENCOUNTER — Other Ambulatory Visit: Payer: Self-pay | Admitting: Neurosurgery

## 2011-10-30 ENCOUNTER — Encounter (HOSPITAL_COMMUNITY): Payer: Self-pay | Admitting: Pharmacy Technician

## 2011-11-04 ENCOUNTER — Other Ambulatory Visit: Payer: Self-pay | Admitting: Neurosurgery

## 2011-11-04 DIAGNOSIS — Z0279 Encounter for issue of other medical certificate: Secondary | ICD-10-CM

## 2011-11-04 DIAGNOSIS — M47812 Spondylosis without myelopathy or radiculopathy, cervical region: Secondary | ICD-10-CM

## 2011-11-04 DIAGNOSIS — M542 Cervicalgia: Secondary | ICD-10-CM

## 2011-11-06 ENCOUNTER — Encounter (HOSPITAL_COMMUNITY)
Admission: RE | Admit: 2011-11-06 | Discharge: 2011-11-06 | Disposition: A | Discharge status: Home / Self Care | Payer: Medicare Other | Source: Ambulatory Visit | Attending: Anesthesiology | Admitting: Anesthesiology

## 2011-11-06 ENCOUNTER — Encounter (HOSPITAL_COMMUNITY): Payer: Self-pay

## 2011-11-06 ENCOUNTER — Encounter (HOSPITAL_COMMUNITY)
Admission: RE | Admit: 2011-11-06 | Discharge: 2011-11-06 | Disposition: A | Discharge status: Home / Self Care | Payer: Medicare Other | Source: Ambulatory Visit | Attending: Neurosurgery | Admitting: Neurosurgery

## 2011-11-06 HISTORY — DX: Gastric ulcer, unspecified as acute or chronic, without hemorrhage or perforation: K25.9

## 2011-11-06 HISTORY — DX: Anemia, unspecified: D64.9

## 2011-11-06 HISTORY — DX: Frequency of micturition: R35.0

## 2011-11-06 HISTORY — DX: Bronchitis, not specified as acute or chronic: J40

## 2011-11-06 HISTORY — DX: Reserved for inherently not codable concepts without codable children: IMO0001

## 2011-11-06 HISTORY — DX: Inflammatory liver disease, unspecified: K75.9

## 2011-11-06 HISTORY — DX: Encounter for other specified aftercare: Z51.89

## 2011-11-06 HISTORY — DX: Urinary tract infection, site not specified: N39.0

## 2011-11-06 LAB — CBC
HCT: 42.9 % (ref 36.0–46.0)
Hemoglobin: 14.5 g/dL (ref 12.0–15.0)
MCH: 31.4 pg (ref 26.0–34.0)
MCHC: 33.8 g/dL (ref 30.0–36.0)
MCV: 92.9 fL (ref 78.0–100.0)
RDW: 12.8 % (ref 11.5–15.5)

## 2011-11-06 LAB — SURGICAL PCR SCREEN: Staphylococcus aureus: NEGATIVE

## 2011-11-06 NOTE — Pre-Procedure Instructions (Addendum)
20 Jovee Dettinger  11/06/2011   Your procedure is scheduled on:  Wednesday Nov 13, 2011  Report to [facility] at 0630 AM.  Call this number if you have problems the morning of surgery: [phone]   Remember:   Do not eat food:After Midnight.  May have clear liquids: up to 4 Hours before arrival. (up to 2:30am)  Clear liquids include soda, tea, black coffee, apple or grape juice, broth.  Take these medicines the morning of surgery with A SIP OF WATER: albuterol, celexa, hydrocodone, levothyroxine, ativan, nasonex, tramadol, aricept   Do not wear jewelry, make-up or nail polish.  Do not wear lotions, powders, or perfumes. You may wear deodorant.  Do not shave 48 hours prior to surgery.  Do not bring valuables to the hospital.  Contacts, dentures or bridgework may not be worn into surgery.  Leave suitcase in the car. After surgery it may be brought to your room.  For patients admitted to the hospital, checkout time is 11:00 AM the day of discharge.   Patients discharged the day of surgery will not be allowed to drive home.  Name and phone number of your driver: Candis Musa [phone] [facility] [phone]  Special Instructions: CHG Shower Use Special Wash: 1/2 bottle night before surgery and 1/2 bottle morning of surgery.   Please read over the following fact sheets that you were given: Pain Booklet, Coughing and Deep Breathing, MRSA Information and Surgical Site Infection Prevention

## 2011-11-06 NOTE — Progress Notes (Signed)
Contacted [facility], reviewed preop instructions with patients nurse, Annice Pih.  Faxed Copy of preop instructions to [facility].

## 2011-11-12 ENCOUNTER — Ambulatory Visit
Admission: RE | Admit: 2011-11-12 | Discharge: 2011-11-12 | Disposition: A | Discharge status: Home / Self Care | Payer: Medicare Other | Source: Ambulatory Visit | Attending: Neurosurgery | Admitting: Neurosurgery

## 2011-11-12 DIAGNOSIS — M542 Cervicalgia: Secondary | ICD-10-CM

## 2011-11-12 DIAGNOSIS — M47812 Spondylosis without myelopathy or radiculopathy, cervical region: Secondary | ICD-10-CM

## 2011-11-12 DIAGNOSIS — Z0279 Encounter for issue of other medical certificate: Secondary | ICD-10-CM

## 2011-11-12 MED ORDER — CEFAZOLIN SODIUM 1-5 GM-% IV SOLN
1.0000 g | INTRAVENOUS | Status: DC
  Filled 2011-11-12: qty 50

## 2011-11-13 ENCOUNTER — Encounter (HOSPITAL_COMMUNITY)
Admission: RE | Disposition: A | Discharge status: Skilled Nursing Facility | Payer: Self-pay | Source: Ambulatory Visit | Attending: Neurosurgery

## 2011-11-13 ENCOUNTER — Encounter (HOSPITAL_COMMUNITY): Payer: Self-pay | Admitting: Certified Registered"

## 2011-11-13 ENCOUNTER — Inpatient Hospital Stay (HOSPITAL_COMMUNITY)
Admission: RE | Admit: 2011-11-13 | Discharge: 2011-11-20 | DRG: 473 | Disposition: A | Discharge status: Skilled Nursing Facility | Payer: Medicare Other | Source: Ambulatory Visit | Attending: Neurosurgery | Admitting: Neurosurgery

## 2011-11-13 ENCOUNTER — Inpatient Hospital Stay (HOSPITAL_COMMUNITY): Payer: Medicare Other

## 2011-11-13 ENCOUNTER — Inpatient Hospital Stay (HOSPITAL_COMMUNITY): Payer: Medicare Other | Admitting: Certified Registered"

## 2011-11-13 DIAGNOSIS — E039 Hypothyroidism, unspecified: Secondary | ICD-10-CM | POA: Diagnosis present

## 2011-11-13 DIAGNOSIS — Z981 Arthrodesis status: Secondary | ICD-10-CM

## 2011-11-13 DIAGNOSIS — M47812 Spondylosis without myelopathy or radiculopathy, cervical region: Principal | ICD-10-CM | POA: Diagnosis present

## 2011-11-13 DIAGNOSIS — E785 Hyperlipidemia, unspecified: Secondary | ICD-10-CM | POA: Diagnosis present

## 2011-11-13 DIAGNOSIS — Z79899 Other long term (current) drug therapy: Secondary | ICD-10-CM

## 2011-11-13 DIAGNOSIS — F329 Major depressive disorder, single episode, unspecified: Secondary | ICD-10-CM | POA: Diagnosis present

## 2011-11-13 DIAGNOSIS — F3289 Other specified depressive episodes: Secondary | ICD-10-CM | POA: Diagnosis present

## 2011-11-13 HISTORY — PX: POSTERIOR CERVICAL FUSION/FORAMINOTOMY: SHX5038

## 2011-11-13 SURGERY — POSTERIOR CERVICAL FUSION/FORAMINOTOMY LEVEL 1
Anesthesia: General | Wound class: Clean

## 2011-11-13 MED ORDER — POTASSIUM CHLORIDE IN NACL 20-0.9 MEQ/L-% IV SOLN
INTRAVENOUS | Status: DC
  Administered 2011-11-13: via INTRAVENOUS
  Filled 2011-11-13 (×14): qty 1000

## 2011-11-13 MED ORDER — 0.9 % SODIUM CHLORIDE (POUR BTL) OPTIME
TOPICAL | Status: DC | PRN
  Administered 2011-11-13: 1000 mL

## 2011-11-13 MED ORDER — METHOCARBAMOL 500 MG PO TABS
500.0000 mg | ORAL_TABLET | Freq: Four times a day (QID) | ORAL | Status: DC | PRN
  Administered 2011-11-14 – 2011-11-20 (×11): 500 mg via ORAL
  Filled 2011-11-13 (×12): qty 1

## 2011-11-13 MED ORDER — ROCURONIUM BROMIDE 100 MG/10ML IV SOLN
INTRAVENOUS | Status: DC | PRN
  Administered 2011-11-13 (×2): 50 mg via INTRAVENOUS

## 2011-11-13 MED ORDER — HYDROCORTISONE 2.5 % EX CREA: TOPICAL_CREAM | Freq: Two times a day (BID) | CUTANEOUS | Status: DC

## 2011-11-13 MED ORDER — ATORVASTATIN CALCIUM 20 MG PO TABS
20.0000 mg | ORAL_TABLET | Freq: Every day | ORAL | Status: DC
  Administered 2011-11-14 – 2011-11-19 (×5): 20 mg via ORAL
  Filled 2011-11-13 (×7): qty 1

## 2011-11-13 MED ORDER — HETASTARCH-ELECTROLYTES 6 % IV SOLN
INTRAVENOUS | Status: DC | PRN
  Administered 2011-11-13: 16:00:00 via INTRAVENOUS

## 2011-11-13 MED ORDER — LIDOCAINE HCL (CARDIAC) 20 MG/ML IV SOLN
INTRAVENOUS | Status: DC | PRN
  Administered 2011-11-13: 80 mg via INTRAVENOUS

## 2011-11-13 MED ORDER — VECURONIUM BROMIDE 10 MG IV SOLR
INTRAVENOUS | Status: DC | PRN
  Administered 2011-11-13: 2 mg via INTRAVENOUS

## 2011-11-13 MED ORDER — PANTOPRAZOLE SODIUM 40 MG PO TBEC
40.0000 mg | DELAYED_RELEASE_TABLET | Freq: Every day | ORAL | Status: DC
  Administered 2011-11-14 – 2011-11-20 (×7): 40 mg via ORAL
  Filled 2011-11-13 (×3): qty 1
  Filled 2011-11-13: qty 2
  Filled 2011-11-13: qty 1

## 2011-11-13 MED ORDER — METHOCARBAMOL 100 MG/ML IJ SOLN
500.0000 mg | Freq: Four times a day (QID) | INTRAVENOUS | Status: DC | PRN
  Filled 2011-11-13: qty 5

## 2011-11-13 MED ORDER — CITALOPRAM HYDROBROMIDE 10 MG PO TABS
10.0000 mg | ORAL_TABLET | Freq: Every day | ORAL | Status: DC
  Administered 2011-11-14 – 2011-11-20 (×7): 10 mg via ORAL
  Filled 2011-11-13 (×7): qty 1

## 2011-11-13 MED ORDER — MORPHINE SULFATE 4 MG/ML IJ SOLN: 0.0500 mg/kg | INTRAMUSCULAR | Status: DC | PRN

## 2011-11-13 MED ORDER — CEFAZOLIN SODIUM 1-5 GM-% IV SOLN
1.0000 g | Freq: Three times a day (TID) | INTRAVENOUS | Status: AC
  Administered 2011-11-13 – 2011-11-14 (×2): 1 g via INTRAVENOUS
  Filled 2011-11-13 (×2): qty 50

## 2011-11-13 MED ORDER — FLUTICASONE PROPIONATE 50 MCG/ACT NA SUSP
1.0000 | Freq: Two times a day (BID) | NASAL | Status: DC | PRN
  Filled 2011-11-13: qty 16

## 2011-11-13 MED ORDER — ACETAMINOPHEN 325 MG PO TABS: 650.0000 mg | ORAL_TABLET | ORAL | Status: DC | PRN

## 2011-11-13 MED ORDER — SODIUM CHLORIDE 0.9 % IR SOLN
Status: DC | PRN
  Administered 2011-11-13: 12:00:00

## 2011-11-13 MED ORDER — ZOLPIDEM TARTRATE 5 MG PO TABS
5.0000 mg | ORAL_TABLET | Freq: Every evening | ORAL | Status: DC | PRN
  Administered 2011-11-13 – 2011-11-19 (×3): 5 mg via ORAL
  Filled 2011-11-13 (×3): qty 1

## 2011-11-13 MED ORDER — THROMBIN 20000 UNITS EX KIT
PACK | CUTANEOUS | Status: DC | PRN
  Administered 2011-11-13: 12:00:00 via TOPICAL

## 2011-11-13 MED ORDER — ONDANSETRON HCL 4 MG/2ML IJ SOLN: 4.0000 mg | INTRAMUSCULAR | Status: DC | PRN

## 2011-11-13 MED ORDER — WHITE PETROLATUM GEL
Status: AC
  Administered 2011-11-13
  Filled 2011-11-13: qty 5

## 2011-11-13 MED ORDER — BISACODYL 5 MG PO TBEC: 5.0000 mg | DELAYED_RELEASE_TABLET | Freq: Every day | ORAL | Status: DC | PRN

## 2011-11-13 MED ORDER — SODIUM CHLORIDE 0.9 % IJ SOLN
3.0000 mL | Freq: Two times a day (BID) | INTRAMUSCULAR | Status: DC
  Administered 2011-11-13 – 2011-11-17 (×8): 3 mL via INTRAVENOUS
  Administered 2011-11-18: 10:00:00 via INTRAVENOUS
  Administered 2011-11-18 – 2011-11-20 (×4): 3 mL via INTRAVENOUS

## 2011-11-13 MED ORDER — ALUM & MAG HYDROXIDE-SIMETH 200-200-20 MG/5ML PO SUSP: 30.0000 mL | Freq: Four times a day (QID) | ORAL | Status: DC | PRN

## 2011-11-13 MED ORDER — MORPHINE SULFATE 2 MG/ML IJ SOLN
1.0000 mg | INTRAMUSCULAR | Status: DC | PRN
Start: 2011-11-13 — End: 2011-11-20
  Administered 2011-11-14 (×2): 4 mg via INTRAVENOUS
  Administered 2011-11-14 – 2011-11-19 (×13): 2 mg via INTRAVENOUS
  Administered 2011-11-19 – 2011-11-20 (×4): 4 mg via INTRAVENOUS
  Filled 2011-11-13 (×3): qty 1
  Filled 2011-11-13: qty 2
  Filled 2011-11-13 (×4): qty 1
  Filled 2011-11-13: qty 2
  Filled 2011-11-13 (×2): qty 1
  Filled 2011-11-13: qty 2
  Filled 2011-11-13 (×2): qty 1
  Filled 2011-11-13 (×4): qty 2

## 2011-11-13 MED ORDER — BACITRACIN ZINC 500 UNIT/GM EX OINT
TOPICAL_OINTMENT | CUTANEOUS | Status: DC | PRN
  Administered 2011-11-13: 1 via TOPICAL

## 2011-11-13 MED ORDER — SUFENTANIL CITRATE 50 MCG/ML IV SOLN
INTRAVENOUS | Status: DC | PRN
  Administered 2011-11-13 (×2): 5 ug via INTRAVENOUS
  Administered 2011-11-13: 20 ug via INTRAVENOUS
  Administered 2011-11-13 (×2): 10 ug via INTRAVENOUS

## 2011-11-13 MED ORDER — FENTANYL CITRATE 0.05 MG/ML IJ SOLN
INTRAMUSCULAR | Status: DC | PRN
  Administered 2011-11-13 (×2): 25 ug via INTRAVENOUS

## 2011-11-13 MED ORDER — DONEPEZIL HCL 10 MG PO TABS
10.0000 mg | ORAL_TABLET | Freq: Every day | ORAL | Status: DC
  Administered 2011-11-13 – 2011-11-19 (×7): 10 mg via ORAL
  Filled 2011-11-13 (×8): qty 1

## 2011-11-13 MED ORDER — HYDROCHLOROTHIAZIDE 12.5 MG PO CAPS
12.5000 mg | ORAL_CAPSULE | Freq: Every day | ORAL | Status: DC
  Administered 2011-11-14 – 2011-11-20 (×7): 12.5 mg via ORAL
  Filled 2011-11-13 (×7): qty 1

## 2011-11-13 MED ORDER — NEOSTIGMINE METHYLSULFATE 1 MG/ML IJ SOLN
INTRAMUSCULAR | Status: DC | PRN
  Administered 2011-11-13: 4 mg via INTRAVENOUS

## 2011-11-13 MED ORDER — SODIUM CHLORIDE 0.9 % IV SOLN: 250.0000 mL | INTRAVENOUS | Status: DC

## 2011-11-13 MED ORDER — MAGNESIUM CITRATE PO SOLN
1.0000 | Freq: Once | ORAL | Status: AC | PRN
  Filled 2011-11-13: qty 296

## 2011-11-13 MED ORDER — HYDROMORPHONE HCL PF 1 MG/ML IJ SOLN
INTRAMUSCULAR | Status: AC
  Filled 2011-11-13: qty 1

## 2011-11-13 MED ORDER — ONDANSETRON HCL 4 MG/2ML IJ SOLN: 4.0000 mg | Freq: Once | INTRAMUSCULAR | Status: DC | PRN

## 2011-11-13 MED ORDER — ONDANSETRON HCL 4 MG/2ML IJ SOLN
INTRAMUSCULAR | Status: DC | PRN
  Administered 2011-11-13: 4 mg via INTRAVENOUS

## 2011-11-13 MED ORDER — HYDROMORPHONE HCL PF 1 MG/ML IJ SOLN
0.2500 mg | INTRAMUSCULAR | Status: DC | PRN
  Administered 2011-11-13 (×3): 0.25 mg via INTRAVENOUS

## 2011-11-13 MED ORDER — GLYCOPYRROLATE 0.2 MG/ML IJ SOLN
INTRAMUSCULAR | Status: DC | PRN
  Administered 2011-11-13: 0.2 mg via INTRAVENOUS
  Administered 2011-11-13: 0.6 mg via INTRAVENOUS

## 2011-11-13 MED ORDER — LEVOTHYROXINE SODIUM 150 MCG PO TABS
150.0000 ug | ORAL_TABLET | Freq: Every day | ORAL | Status: DC
  Administered 2011-11-14 – 2011-11-20 (×7): 150 ug via ORAL
  Filled 2011-11-13 (×9): qty 1

## 2011-11-13 MED ORDER — MENTHOL 3 MG MT LOZG: 1.0000 | LOZENGE | OROMUCOSAL | Status: DC | PRN

## 2011-11-13 MED ORDER — ACETAMINOPHEN 650 MG RE SUPP: 650.0000 mg | RECTAL | Status: DC | PRN

## 2011-11-13 MED ORDER — PROPOFOL 10 MG/ML IV EMUL
INTRAVENOUS | Status: DC | PRN
  Administered 2011-11-13: 150 mg via INTRAVENOUS
  Administered 2011-11-13: 20 mg via INTRAVENOUS
  Administered 2011-11-13: 30 mg via INTRAVENOUS

## 2011-11-13 MED ORDER — LACTATED RINGERS IV SOLN
INTRAVENOUS | Status: DC | PRN
  Administered 2011-11-13 (×4): via INTRAVENOUS

## 2011-11-13 MED ORDER — BENZONATATE 100 MG PO CAPS
200.0000 mg | ORAL_CAPSULE | Freq: Three times a day (TID) | ORAL | Status: DC | PRN
  Filled 2011-11-13: qty 2

## 2011-11-13 MED ORDER — SODIUM CHLORIDE 0.9 % IJ SOLN: 3.0000 mL | INTRAMUSCULAR | Status: DC | PRN

## 2011-11-13 MED ORDER — HEMOSTATIC AGENTS (NO CHARGE) OPTIME
TOPICAL | Status: DC | PRN
  Administered 2011-11-13: 1 via TOPICAL

## 2011-11-13 MED ORDER — OXYCODONE-ACETAMINOPHEN 5-325 MG PO TABS
1.0000 | ORAL_TABLET | ORAL | Status: DC | PRN
  Administered 2011-11-13 – 2011-11-20 (×28): 2 via ORAL
  Filled 2011-11-13 (×28): qty 2

## 2011-11-13 MED ORDER — HYDROCORTISONE 1 % EX CREA
TOPICAL_CREAM | Freq: Two times a day (BID) | CUTANEOUS | Status: DC
  Administered 2011-11-13 – 2011-11-18 (×10): via TOPICAL
  Filled 2011-11-13 (×2): qty 28

## 2011-11-13 MED ORDER — POLYETHYLENE GLYCOL 3350 17 G PO PACK
17.0000 g | PACK | Freq: Every day | ORAL | Status: DC | PRN
  Filled 2011-11-13: qty 1

## 2011-11-13 MED ORDER — CEFAZOLIN SODIUM 1-5 GM-% IV SOLN
INTRAVENOUS | Status: DC | PRN
  Administered 2011-11-13: 1 g via INTRAVENOUS

## 2011-11-13 MED ORDER — ALBUTEROL SULFATE HFA 108 (90 BASE) MCG/ACT IN AERS: 2.0000 | INHALATION_SPRAY | RESPIRATORY_TRACT | Status: DC | PRN

## 2011-11-13 MED ORDER — PHENOL 1.4 % MT LIQD
1.0000 | OROMUCOSAL | Status: DC | PRN
  Administered 2011-11-13: 1 via OROMUCOSAL
  Filled 2011-11-13: qty 177

## 2011-11-13 MED ORDER — PHENYLEPHRINE HCL 10 MG/ML IJ SOLN
INTRAMUSCULAR | Status: DC | PRN
  Administered 2011-11-13: 80 ug via INTRAVENOUS
  Administered 2011-11-13: 120 ug via INTRAVENOUS
  Administered 2011-11-13 (×2): 80 ug via INTRAVENOUS

## 2011-11-13 MED ORDER — SUCCINYLCHOLINE CHLORIDE 20 MG/ML IJ SOLN
INTRAMUSCULAR | Status: DC | PRN
  Administered 2011-11-13: 100 mg via INTRAVENOUS

## 2011-11-13 MED ORDER — SENNA 8.6 MG PO TABS
1.0000 | ORAL_TABLET | Freq: Two times a day (BID) | ORAL | Status: DC
  Administered 2011-11-13 – 2011-11-20 (×13): 8.6 mg via ORAL
  Filled 2011-11-13 (×15): qty 1

## 2011-11-13 MED ORDER — EPHEDRINE SULFATE 50 MG/ML IJ SOLN
INTRAMUSCULAR | Status: DC | PRN
  Administered 2011-11-13: 5 mg via INTRAVENOUS
  Administered 2011-11-13: 10 mg via INTRAVENOUS

## 2011-11-13 MED ORDER — HYDROCODONE-ACETAMINOPHEN 5-325 MG PO TABS: 1.0000 | ORAL_TABLET | ORAL | Status: DC | PRN

## 2011-11-13 MED ORDER — BENZONATATE 100 MG PO CAPS: 200.0000 mg | ORAL_CAPSULE | Freq: Three times a day (TID) | ORAL | Status: DC | PRN

## 2011-11-13 SURGICAL SUPPLY — 70 items
BAG DECANTER FOR FLEXI CONT (MISCELLANEOUS) ×2 IMPLANT
BENZOIN TINCTURE PRP APPL 2/3 (GAUZE/BANDAGES/DRESSINGS) ×4 IMPLANT
BIT DRILL 2.4X (BIT) ×1 IMPLANT
BIT DRILL NEURO 2X3.1 SFT TUCH (MISCELLANEOUS) ×1 IMPLANT
BIT DRL 2.4X (BIT) ×1
BLADE SURG ROTATE 9660 (MISCELLANEOUS) ×2 IMPLANT
BLADE ULTRA TIP 2M (BLADE) IMPLANT
BUR MATCHSTICK NEURO 3.0 LAGG (BURR) ×2 IMPLANT
CABLE DBL STERILE W/CRIMP ×2 IMPLANT
CANISTER SUCTION 2500CC (MISCELLANEOUS) ×2 IMPLANT
CLOTH BEACON ORANGE TIMEOUT ST (SAFETY) ×2 IMPLANT
CONT SPEC 4OZ CLIKSEAL STRL BL (MISCELLANEOUS) ×2 IMPLANT
DECANTER SPIKE VIAL GLASS SM (MISCELLANEOUS) ×2 IMPLANT
DRAPE C-ARM 42X72 X-RAY (DRAPES) ×4 IMPLANT
DRAPE LAPAROTOMY 100X72 PEDS (DRAPES) ×2 IMPLANT
DRAPE LAPAROTOMY T 102X78X121 (DRAPES) ×2 IMPLANT
DRAPE POUCH INSTRU U-SHP 10X18 (DRAPES) ×2 IMPLANT
DRESSING TELFA 8X3 (GAUZE/BANDAGES/DRESSINGS) IMPLANT
DRILL BIT (BIT) ×1
DRILL NEURO 2X3.1 SOFT TOUCH (MISCELLANEOUS) ×2
DURAPREP 6ML APPLICATOR 50/CS (WOUND CARE) ×4 IMPLANT
ELECT REM PT RETURN 9FT ADLT (ELECTROSURGICAL) ×2
ELECTRODE REM PT RTRN 9FT ADLT (ELECTROSURGICAL) ×1 IMPLANT
GAUZE SPONGE 4X4 16PLY XRAY LF (GAUZE/BANDAGES/DRESSINGS) IMPLANT
GLOVE BIO SURGEON STRL SZ8.5 (GLOVE) ×2 IMPLANT
GLOVE BIOGEL PI IND STRL 6.5 (GLOVE) ×3 IMPLANT
GLOVE BIOGEL PI INDICATOR 6.5 (GLOVE) ×3
GLOVE ECLIPSE 6.5 STRL STRAW (GLOVE) ×8 IMPLANT
GLOVE EXAM NITRILE LRG STRL (GLOVE) IMPLANT
GLOVE EXAM NITRILE MD LF STRL (GLOVE) IMPLANT
GLOVE EXAM NITRILE XL STR (GLOVE) IMPLANT
GLOVE EXAM NITRILE XS STR PU (GLOVE) IMPLANT
GLOVE INDICATOR 7.0 STRL GRN (GLOVE) ×6 IMPLANT
GLOVE OPTIFIT SS 8.0 STRL (GLOVE) ×2 IMPLANT
GOWN BRE IMP SLV AUR LG STRL (GOWN DISPOSABLE) ×6 IMPLANT
GOWN BRE IMP SLV AUR XL STRL (GOWN DISPOSABLE) ×2 IMPLANT
GOWN STRL REIN 2XL LVL4 (GOWN DISPOSABLE) IMPLANT
KIT BASIN OR (CUSTOM PROCEDURE TRAY) ×2 IMPLANT
KIT INFUSE SMALL (Orthopedic Implant) ×2 IMPLANT
KIT ROOM TURNOVER OR (KITS) ×2 IMPLANT
NEEDLE HYPO 25X1 1.5 SAFETY (NEEDLE) ×2 IMPLANT
NS IRRIG 1000ML POUR BTL (IV SOLUTION) ×2 IMPLANT
PACK LAMINECTOMY NEURO (CUSTOM PROCEDURE TRAY) ×2 IMPLANT
PAD ARMBOARD 7.5X6 YLW CONV (MISCELLANEOUS) ×6 IMPLANT
PATTIES SURGICAL .5 X.5 (GAUZE/BANDAGES/DRESSINGS) ×2 IMPLANT
PIN MAYFIELD SKULL DISP (PIN) ×2 IMPLANT
ROD VERTEX 3.2MM 240MM (Rod) ×2 IMPLANT
SCREW 3.5X20MM (Screw) ×1 IMPLANT
SCREW BN 20X3.5XMA SPNE (Screw) ×1 IMPLANT
SCREW PART THREADED 3.5X24MM (Screw) ×2 IMPLANT
SCREW SET M6 (Screw) ×4 IMPLANT
SCREW VERTEX 3.5X18 MAX (Screw) IMPLANT
SPONGE GAUZE 4X4 12PLY (GAUZE/BANDAGES/DRESSINGS) ×2 IMPLANT
SPONGE LAP 4X18 X RAY DECT (DISPOSABLE) IMPLANT
SPONGE SURGIFOAM ABS GEL 100 (HEMOSTASIS) ×2 IMPLANT
STAPLER SKIN PROX WIDE 3.9 (STAPLE) ×2 IMPLANT
STRIP CLOSURE SKIN 1/2X4 (GAUZE/BANDAGES/DRESSINGS) IMPLANT
STRIP ILIUM TRICORT 2.2CMX60MM (Neuro Prosthesis/Implant) ×2 IMPLANT
SUT ETHILON 3 0 FSL (SUTURE) IMPLANT
SUT VIC AB 0 CT1 18XCR BRD8 (SUTURE) ×2 IMPLANT
SUT VIC AB 0 CT1 8-18 (SUTURE) ×2
SUT VIC AB 2-0 CP2 18 (SUTURE) ×2 IMPLANT
SUT VIC AB 2-0 CT1 18 (SUTURE) ×2 IMPLANT
SUT VIC AB 3-0 SH 8-18 (SUTURE) ×2 IMPLANT
SYR 20ML ECCENTRIC (SYRINGE) ×2 IMPLANT
TAPE CLOTH SURG 4X10 WHT LF (GAUZE/BANDAGES/DRESSINGS) ×2 IMPLANT
TOWEL OR 17X24 6PK STRL BLUE (TOWEL DISPOSABLE) ×2 IMPLANT
TOWEL OR 17X26 10 PK STRL BLUE (TOWEL DISPOSABLE) ×2 IMPLANT
UNDERPAD 30X30 INCONTINENT (UNDERPADS AND DIAPERS) ×2 IMPLANT
WATER STERILE IRR 1000ML POUR (IV SOLUTION) ×2 IMPLANT

## 2011-11-13 NOTE — Anesthesia Preprocedure Evaluation (Addendum)
Anesthesia Evaluation  Patient identified by MRN, date of birth, ID band Patient awake    Reviewed: Allergy & Precautions, H&P , NPO status , Patient's Chart, lab work & pertinent test results  Airway Mallampati: III TM Distance: <3 FB   Mouth opening: Limited Mouth Opening  Dental  (+) Missing and Dental Advisory Given   Pulmonary  breath sounds clear to auscultation        Cardiovascular hypertension, Pt. on medications + Valvular Problems/Murmurs Rhythm:Regular     Neuro/Psych Depression    GI/Hepatic PUD, (+) Hepatitis -, CHep c x 15 years ago   Endo/Other  Hypothyroidism   Renal/GU      Musculoskeletal   Abdominal   Peds  Hematology   Anesthesia Other Findings   Reproductive/Obstetrics                         Anesthesia Physical Anesthesia Plan  ASA: III  Anesthesia Plan: General   Post-op Pain Management:    Induction: Intravenous  Airway Management Planned: Oral ETT and Video Laryngoscope Planned  Additional Equipment:   Intra-op Plan:   Post-operative Plan: Extubation in OR  Informed Consent: I have reviewed the patients History and Physical, chart, labs and discussed the procedure including the risks, benefits and alternatives for the proposed anesthesia with the patient or authorized representative who has indicated his/her understanding and acceptance.   Dental advisory given  Plan Discussed with: CRNA, Anesthesiologist and Surgeon  Anesthesia Plan Comments:        Anesthesia Quick Evaluation

## 2011-11-13 NOTE — H&P (Signed)
BP 129/81  Pulse 50  Temp(Src) 98.2 F (36.8 C) (Oral)  Resp 16  SpO2 97% Mallory Burke comes in today for evaluation of neck pain. She has an isolated region right behind the right ear which extends maybe 2-3 cm. below that. It does not radiate into the arm. It does not radiate to the left nor does it radiate into the rest of her neck. She also has a number of headaches. She was evaluated with a CT of the head and neck. They showed spondylitic changes and she was sent to me for further evaluation.  She has had no cognitive problems, no speech difficulties. SOCIAL HISTORY: Mallory Burke is 78 years of age. She is retired. Right-handed. She actually resides for the most part in [place] but for the past few years has split time between there and here in [place] where she does have help. PAST MEDICAL HISTORY: Otherwise significant for significant amount of low back pain. She has had multiple surgeries on her lower back all performed in [place]. Past medical history according to records is arthritis, heart murmur, hyperlipidemia, hypothyroidism, and depression. She has also had galactorrhea, abdominal pain, vitamin B12 deficiency, confusion, depression, and hyperlipidemia. She has had her lumbar spine fused.  MEDICATIONS: Lorazepam, Crestor, Methocarbamol, Citalopram, Levo-Thyroid, Ex-Lax, Naprosyn and Pennsaid. No known drug allergies. PHYSICAL EXAMINATION: On examination, she is alert and oriented x four and answering all questions appropriately. Memory, language, attention span and fund of knowledge are normal. Speech is clear and fluent. Pupils are equal, round, and reactive to light. Full EOM's. Full visual fields. Symmetric facial sensation and movements. Hearing is intact to finger rub bilaterally. Uvula elevates in the midline. Shoulder shrug is normal. Tongue protrudes in the midline. She has 5/5 strength in the upper and lower extremities. She has a markedly antalgic gait.  She is very slow but steady. Reflexes are 2+ at the knees bilaterally, trace right ankle, 1+ left ankle. She has mild Hoffmann's signs bilaterally with no clonus. Proprioception is intact in the upper and lower extremities. Normal muscle tone, bulk and coordination.  IMAGING STUDIES: CT of the cervical spine is reviewed and shows significant spondylitic change with some foraminal narrowing. The right C1/2 facet is markedly arthritic, enlarged, and sclerotic, and edematous on MRI. There is a significant amount of spondylitic change in the entire cervical spine.  Assessment/plan I believe the facet arthropathy at C1/2 on the right is the reason for Mallory Burke severe neck pain. I do believe that a fusion of the C1/2 joint will  Offer the best chance of relief. The risks including bleeding, infection, fusion failure, hardware failure, need for reoperation, paralysis, weakness, bowel bladder dysfunction, stroke, coma, and death, along with other risks were discussed. I plan on C1 lateral mass screw placement, and C2 pedicle screw placement, and possible laminar arthrodesis. She and her daughter understand, and wish to proceed.

## 2011-11-13 NOTE — Anesthesia Postprocedure Evaluation (Signed)
  Anesthesia Post-op Note  Patient: Mallory Burke  Procedure(s) Performed: Procedure(s) (LRB): POSTERIOR CERVICAL FUSION/FORAMINOTOMY LEVEL 1 (N/A)  Patient Location: PACU  Anesthesia Type: General  Level of Consciousness: sedated  Airway and Oxygen Therapy: Patient Spontanous Breathing and Patient connected to nasal cannula oxygen  Post-op Pain: mild  Post-op Assessment: Post-op Vital signs reviewed, Patient's Cardiovascular Status Stable, Respiratory Function Stable, Patent Airway, No signs of Nausea or vomiting and Pain level controlled  Post-op Vital Signs: Reviewed and stable  Complications: No apparent anesthesia complications

## 2011-11-13 NOTE — Preoperative (Signed)
Beta Blockers   Reason not to administer Beta Blockers:Not Applicable 

## 2011-11-13 NOTE — Op Note (Signed)
11/13/2011  5:13 PM  PATIENT:  Mallory Burke  78 y.o. female with multiyear history of right sided neck pain. CT has shown a markedly degenerative joint on the right at C1/2. With its progression and her increasing pain not relieved with conservative treatment she has agreed to a posterior fusion of C1 and C2.  PRE-OPERATIVE DIAGNOSIS:  cervical spondylosis R C1/2 neck pain  POST-OPERATIVE DIAGNOSIS:  cervical spondylosis R C1/2 neck pain s neck pain  PROCEDURE:  Procedure(s): C1-2 posterior arthrodesis, structural allograft, Songar Cable Posterior instrumentation Lateral mass C1 screw, Left C2 pedicle screw  SURGEON:  Surgeon(s): Carmela Hurt, MD Cristi Loron, MD  ASSISTANTS:Jenkins  ANESTHESIA:   general  EBL:  Total I/O In: 3500 [I.V.:3000; IV Piggyback:500] Out: 500 [Blood:500]  BLOOD ADMINISTERED:none  CELL SAVER GIVEN:none  COUNT:per nursing  DRAINS: none   SPECIMEN:  No Specimen  DICTATION: Mallory Burke was brought to the operating room intubated and placed under a general anesthetic.She was positioned after I placed a 3 pin mayfield headholder after adequate anesthesia was obtained. She was placed prone onto the or table secured to the [facility] with her head in flexion. I shaved her head and assessed her position with fluoroscopy. Mallory Jillyn Hidden -brown was then prepped and draped in a sterile manner. I opened the skin with a 10 blade , and went further with monopolar cautery. I exposed the posterior arch of C1, the lamina of C2 and the entire facet of C2 bilaterally. I exposed the C2 nerve roots bilaterally and the lateral most aspect of C1. I removed the ligamentum flavum on the left between c2 and C1. I then palpated the medial aspect of the C2 pedicle on the left side. Using an awl I made an entry site, then used a power drill with a 2.53mm burr. I with frequent fluoroscopy advanced the drill through the pedicle. I used a blunt probe and did not appreciate a cutout.  I tapped the hole with a 3.5 mm tap. I then placed a screw 24mm screw into C2.  With Dr. Lovell Sheehan assistance we placed a C1 lateral mass smooth shank screw on the left side. I used a drill to create a pilot hole then the TPS to advance into the lateral mass. I tapped the hole with a 3.5 mm tap. We felt solid bone, then placed a 18mm screw into C1 on the left. I used Animator. I then with structural allograft and a songar cable completed a posterior arthrodesis using the dickmann sontag technique at C1 and C2. I tightened the cable to ~24lbs of pressure I then completed my construct by placing a rod to connect my screws. I used locking caps and secured them to the screws.  I irrigated the wound. I had good hemostasis. I closed the wound in a layered fashion using vicryls to close the deep cervical fascia, subcutaneous,and subcuticular layers. I used staples on the skin. I applied a sterile dressing. I removed the mayfield and she was rolled onto the stretcher.   PLAN OF CARE: Admit to inpatient   PATIENT DISPOSITION:  PACU - hemodynamically stable.   Delay start of Pharmacological VTE agent (>24hrs) due to surgical blood loss or risk of bleeding:  yes

## 2011-11-13 NOTE — Transfer of Care (Signed)
Immediate Anesthesia Transfer of Care Note  Patient: Mallory Burke  Procedure(s) Performed: Procedure(s) (LRB): POSTERIOR CERVICAL FUSION/FORAMINOTOMY LEVEL 1 (N/A)  Patient Location: PACU  Anesthesia Type: General  Level of Consciousness: awake and alert   Airway & Oxygen Therapy: Patient Spontanous Breathing  Post-op Assessment: Report given to PACU RN  Post vital signs: stable  Complications: No apparent anesthesia complications

## 2011-11-13 NOTE — Progress Notes (Signed)
BP 155/82  Pulse 97  Temp(Src) 98.2 F (36.8 C) (Oral)  Resp 19  Ht 5\' 5"  (1.651 m)  Wt 95.4 kg (210 lb 5.1 oz)  BMI 35.00 kg/m2  SpO2 96% Alert, confused, following commands. Oriented to place, situation, person, year. Moving all extremities.  Dressing stained, intact. Complaining she cannot stop shaking, and that her neck hurts.  She is not febrile. Is still receiving antibiotics.

## 2011-11-13 NOTE — Anesthesia Procedure Notes (Signed)
Procedure Name: Intubation Date/Time: 11/13/2011 12:16 PM Performed by: Glendora Score A Pre-anesthesia Checklist: Patient identified, Emergency Drugs available, Suction available and Patient being monitored Patient Re-evaluated:Patient Re-evaluated prior to inductionOxygen Delivery Method: Circle system utilized Preoxygenation: Pre-oxygenation with 100% oxygen Intubation Type: IV induction and Rapid sequence Tube size: 7.5 mm Number of attempts: 1 Airway Equipment and Method: Stylet and Video-laryngoscopy Placement Confirmation: ETT inserted through vocal cords under direct vision,  positive ETCO2 and breath sounds checked- equal and bilateral Secured at: 21 cm Tube secured with: Tape Dental Injury: Teeth and Oropharynx as per pre-operative assessment

## 2011-11-13 NOTE — OR Nursing (Signed)
After  positioning patient noticed bracelet to left arm not able to remove surgeon aware

## 2011-11-14 ENCOUNTER — Encounter (HOSPITAL_COMMUNITY): Payer: Self-pay | Admitting: *Deleted

## 2011-11-14 DIAGNOSIS — M47812 Spondylosis without myelopathy or radiculopathy, cervical region: Secondary | ICD-10-CM

## 2011-11-14 DIAGNOSIS — M5412 Radiculopathy, cervical region: Secondary | ICD-10-CM

## 2011-11-14 NOTE — Clinical Social Work Placement (Addendum)
    Clinical Social Work Department CLINICAL SOCIAL WORK PLACEMENT NOTE 11/14/2011  Patient:  Mallory Burke, Mallory Burke  Account Number:  [id number] Admit date:  11/13/2011  Clinical Social Worker:  Doree Albee  Date/time:  11/14/2011 04:00 PM  Clinical Social Work is seeking post-discharge placement for this patient at the following level of care:   SKILLED NURSING   (*CSW will update this form in Epic as items are completed)   11/14/2011  Patient/family provided with [facility] Department of Clinical Social Work's list of facilities offering this level of care within the geographic area requested by the patient (or if unable, by the patient's family).  11/14/2011  Patient/family informed of their freedom to choose among providers that offer the needed level of care, that participate in Medicare, Medicaid or managed care program needed by the patient, have an available bed and are willing to accept the patient.  11/14/2011  Patient/family informed of [facility]' ownership interest in [facility], as well as of the fact that they are under no obligation to receive care at this facility.  PASARR submitted to EDS on 11/14/2011 PASARR number received from EDS on 11/15/11  FL2 transmitted to all facilities in geographic area requested by pt/family on  11/14/2011 FL2 transmitted to all facilities within larger geographic area on   Patient informed that his/her managed care company has contracts with or will negotiate with  certain facilities, including the following:     Patient/family informed of bed offers received:  11/15/11 Patient chooses bed at [facility] Physician recommends and patient chooses bed at  SNF  Patient to be transferred to [facility] on 11/20/11 Patient to be transferred to facility by [facility]  The following physician request were entered in Epic:   Additional Comments:  Dede Query, MSW, Theresia Majors [phone]

## 2011-11-14 NOTE — Progress Notes (Signed)
UR COMPLETED  

## 2011-11-14 NOTE — Progress Notes (Signed)
Per Rehab MD consult, patient not appropriate for CIR, but feel she is at SNF level of care prior to returning to the ALF she resides in. Discussed with patient who is very sleepy. She states, her family wouldn't be able to help her much and she would need more time than CIR would allow for her to get back to [facility]. RNCM made aware. Thanks for consult. Toni Amend adm coordinator [phone].

## 2011-11-14 NOTE — Care Management Note (Signed)
    Page 1 of 1   11/21/2011     11:20:02 AM   CARE MANAGEMENT NOTE 11/21/2011  Patient:  Mallory Burke, Mallory Burke   Account Number:  [id number]  Date Initiated:  11/14/2011  Documentation initiated by:  Onnie Boer  Subjective/Objective Assessment:   PT WAS ADMITTED WITH NECK PAIN     Action/Plan:   PROGRESSION OF CARE AND DISCHARGE PLANNING   Anticipated DC Date:  11/17/2011   Anticipated DC Plan:  SKILLED NURSING FACILITY  In-house referral  Clinical Social Worker      DC Planning Services  CM consult      Choice offered to / List presented to:             Status of service:  Completed, signed off Medicare Important Message given?   (If response is "NO", the following Medicare IM given date fields will be blank) Date Medicare IM given:   Date Additional Medicare IM given:    Discharge Disposition:  SKILLED NURSING FACILITY  Per UR Regulation:  Reviewed for med. necessity/level of care/duration of stay  If discussed at Long Length of Stay Meetings, dates discussed:   11/20/2011    Comments:  11/20/11 Onnie Boer, RN, BSN 1700 PT WAS DC'D TO [facility]  11/19/11 Onnie Boer, RN, BSN 1537 PT IS SLOW TO PROGRESS.  PLAN IS FOR SNF WHEN MEDICALLY STABLE.  11/14/11 Onnie Boer, RN, BSN 1403 PT WAS ADMITTED WITH NECK PAIN AND HAS HAD A CERVICAL INFUSION.  PT WAS FROM [facility] AND WILL NEED SNF AT DC.  PT DOESNT QUALIFY FOR CIR.  WILL F/U.

## 2011-11-14 NOTE — Progress Notes (Signed)
Patient ID: Mallory Burke, female   DOB: [birth year], 78 y.o.   MRN: [id number] BP 133/77  Pulse 76  Temp(Src) 98.4 F (36.9 C) (Oral)  Resp 17  Ht 5\' 5"  (1.651 m)  Wt 95.4 kg (210 lb 5.1 oz)  BMI 35.00 kg/m2  SpO2 96% Alert and oriented x 4. Speech clear and fluent. Moving all extremities well, baseline for Right lower extremity. Has significant knee pain on the right. Dressing intact. Will order collar.  Pt/Ot

## 2011-11-14 NOTE — Clinical Social Work Psychosocial (Signed)
     Clinical Social Work Department BRIEF PSYCHOSOCIAL ASSESSMENT 11/14/2011  Patient:  Mallory Burke, Mallory Burke     Account Number:  [id number]     Admit date:  11/13/2011  Clinical Social Worker:  Doree Albee  Date/Time:  11/14/2011 03:15 PM  Referred by:  RN  Date Referred:  11/14/2011 Referred for  ALF Placement   Other Referral:   pt admitted from [facility]   Interview type:  Patient Other interview type:    PSYCHOSOCIAL DATA Living Status:  FACILITY Admitted from facility:  [facility] Level of care:  Assisted Living Primary support name:  Celso Amy Primary support relationship to patient:  FAMILY Degree of support available:   strong, patient grand daughter    CURRENT CONCERNS Current Concerns  Post-Acute Placement   Other Concerns:    SOCIAL WORK ASSESSMENT / PLAN CSW met with patient at bedside to discuss pt discharge plans. Pt shared that she is a resident at [facility] where she was previouslly getting assistance with getting dressed and bathing.    Pt stated that she is hoping to return to [facility] whenever medically stable. Pt and CSW discussed that the assisted living may or may not be able to meet her physical needs.  Pt and csw discussed the option of skilled nursing for short term rehab if needed and provided patient with skilled nursing facility list.   Assessment/plan status:  Psychosocial Support/Ongoing Assessment of Needs Other assessment/ plan:   and discharge planning   Information/referral to community resources:   skilled nursing facility list    PATIENTS/FAMILYS RESPONSE TO PLAN OF CARE: Pt is very appreciative of csw concern and support. pt is hoping to retrn to assisted living but is open to skilled nursing for rehab if needed.

## 2011-11-14 NOTE — Consult Note (Signed)
Physical Medicine and Rehabilitation Consult Reason for Consult: Cervical spondylosis/radiculopathy Referring Phsyician: Dr. Carmon Ginsberg Mallory Burke is an 78 y.o. female.   HPI: 78 year old right-handed female resident of [facility] facility admitted 11/13/2011 with neck pain and progressive headaches. She denied any upper extremity weakness. Initial cranial CT scan negative for acute changes. X-rays and imaging of the neck showed cervical spondylosis radiculopathy C1-2. Underwent cervical C1-2 posterior arthrodesis with posterior instrumentation lateral mass C1 screw, left C2 pedicle screw 11/13/2011 per Dr. Mikal Plane. Postoperative mild confusion and monitored. Physical and on inpatient therapy evaluations are pending. M.D. has requested physical medicine rehabilitation consult to consider inpatient rehabilitation services  Review of Systems  HENT: Positive for neck pain.   Musculoskeletal: Positive for back pain and joint pain.  Neurological: Positive for headaches.  Psychiatric/Behavioral: Positive for memory loss.  All other systems reviewed and are negative.   Past Medical History  Diagnosis Date  . Chicken pox   . Hyperlipidemia   . Depression   . Hypothyroid   . Bronchitis     hx of  . Anemia   . Blood transfusion     hx of  . Hepatitis     Hx of  . Urinary frequency   . Urinary tract infection     hx of  . Arthritis     "legs, neck"  . Ulcer, stomach peptic     hx of  . Heart murmur     patient's primary Dr. Cranston Neighbor   Past Surgical History  Procedure Date  . Back surgery     x3, fusion and rod  . Abdominal hysterectomy   . Knee arthroscopy 2007    right  . Abdominal surgery   . Tubal ligation   . Dilation and curettage of uterus   . Breast lumpectomy     right side   Family History  Problem Relation Age of Onset  . Arthritis    . Hyperlipidemia    . Hypertension     Social History:  reports that she has never smoked. She does not have any  smokeless tobacco history on file. She reports that she does not drink alcohol or use illicit drugs. Allergies: No Known Allergies Medications Prior to Admission  Medication Sig Dispense Refill  . albuterol (PROVENTIL HFA;VENTOLIN HFA) 108 (90 BASE) MCG/ACT inhaler Inhale 2 puffs into the lungs every 4 (four) hours as needed. For cough      . benzonatate (TESSALON) 200 MG capsule Take 200 mg by mouth every 8 (eight) hours as needed. For cough      . celecoxib (CELEBREX) 200 MG capsule Take 200 mg by mouth 2 (two) times daily.      . citalopram (CELEXA) 10 MG tablet Take 10 mg by mouth daily.        Marland Kitchen donepezil (ARICEPT) 10 MG tablet Take 10 mg by mouth daily.      . hydrochlorothiazide (,MICROZIDE/HYDRODIURIL,) 12.5 MG capsule Take 12.5 mg by mouth daily.       Marland Kitchen HYDROcodone-acetaminophen (NORCO) 10-325 MG per tablet Take 1 tablet by mouth every 6 (six) hours as needed. For pain      . hydrocortisone 2.5 % cream Apply topically 2 (two) times daily. For irritation      . levothyroxine (SYNTHROID, LEVOTHROID) 150 MCG tablet Take 150 mcg by mouth daily.      Marland Kitchen loperamide (IMODIUM) 2 MG capsule Take 2 mg by mouth every 6 (six) hours as needed. For diarrhea      .  LORazepam (ATIVAN) 0.5 MG tablet Take 0.5 mg by mouth 3 (three) times daily.      . methocarbamol (ROBAXIN) 750 MG tablet Take 750 mg by mouth at bedtime as needed.        . mometasone (NASONEX) 50 MCG/ACT nasal spray Place 2 sprays into the nose daily.      Marland Kitchen omeprazole (PRILOSEC) 20 MG capsule Take 20 mg by mouth daily.      . rosuvastatin (CRESTOR) 20 MG tablet Take 20 mg by mouth daily.      . traMADol (ULTRAM) 50 MG tablet Take 50 mg by mouth every 8 (eight) hours as needed. For pain        Home:    Functional History:   Functional Status:  Mobility:          ADL:    Cognition: Cognition Orientation Level: Oriented X4    Blood pressure 155/81, pulse 87, temperature 98.9 F (37.2 C), temperature source Oral, resp.  rate 18, height 5\' 5"  (1.651 m), weight 95.4 kg (210 lb 5.1 oz), SpO2 95.00%. Physical Exam  Nursing note and vitals reviewed. Constitutional: She appears lethargic.       In significant pain.  HENT:  Head: Normocephalic.  Cardiovascular: Regular rhythm.   Pulmonary/Chest: Breath sounds normal. She has no wheezes.  Abdominal: She exhibits no distension. There is no tenderness.  Musculoskeletal: She exhibits no edema.       Pain in both knees with flexion and extension  Neck and occiput extremely tender to touch and to minimal movement  Neurological: She appears lethargic.       Mildly lethargic but arousable. She mostly keeps her eyes closed but appropriate for place, name and age. She follows basic commands  Moves UE with 3-/5 strength and significant pain inhibition.   Stocking glove sensory loss in the feet.  Skin:        Surgical site clean and dry    No results found for this or any previous visit (from the past 24 hour(s)). Dg Cervical Spine 1 View  11/13/2011  *RADIOLOGY REPORT*  Clinical Data: Posterior cervical fusion  DG CERVICAL SPINE - 1 VIEW,DG C-ARM GT 120 MIN  Comparison: 11/12/2011  Findings: Two spot images from intraoperative C-arm fluoroscopy document screw placement from posterior approach overlying the C1 and C2 posterior elements.  IMPRESSION:  1.  Intraoperative localization.  Original Report Authenticated By: Osa Craver, M.D.   Ct Cervical Spine Wo Contrast  11/12/2011  *RADIOLOGY REPORT*  Clinical Data: Right-sided neck pain for 2 months.  Right skull pain.  Arthritis.  Multiple steroid injections.  CT CERVICAL SPINE WITHOUT CONTRAST  Technique:  Multidetector CT imaging of the cervical spine was performed. Multiplanar CT image reconstructions were also generated.  Comparison: 11/12/2011 MRI.  Findings: The patient is tilted in the scanner.  Mild straightening of the normal cervical lordosis.  Severe atlantodental degenerative disease is present.   Degenerative cystic erosion is present in the right anterior dens, best seen on sagittal images (image 18 series 401). Cranial settling is again noted, with superior position of the bands relative to the skull base.  Atlanto-occipital junction appears normal.  There are severe destructive changes of the right C1-C2 articulation, with loss of the cortical margins.  There is circumferential calcification around the right C1-C2 articulation, which may represent calcification of the joint capsule.  This type of erosion is uncommonly seen in ordinary degenerative change and chronic septic arthritis is a consideration, particularly in  the setting of injections.  Crystal arthropathy or rheumatoid arthritis could also produce destructive changes. Mild to moderate central stenosis is present associated with leftward subluxation of C2 relative to the atlas.  C2-C3:  Left foraminal encroachment associated with uncovertebral spurring and facet arthrosis.  No bony central stenosis.  Posterior osseous ridging.  C3-C4:  Degenerated disc with shallow disc osteophyte complex. Posterior osseous ridging.  Bilateral foraminal stenosis is present associated with uncovertebral spurring and facet arthrosis.  Mild central stenosis associated with disc osteophyte complex and osseous ridging.  C4-C5: Left greater than right multifactorial foraminal stenosis associated with uncovertebral spurring and facet arthrosis. Posterior osseous ridging.  Mild to moderate central stenosis is multifactorial.  C5-C6:  Bilateral foraminal stenosis secondary uncovertebral spurring and facet arthrosis.  Moderate central stenosis.  Burke- based disc osteophyte complex and posterior osseous ridging.  C6-C7:  Bilateral foraminal stenosis associated with uncovertebral spurring and facet arthrosis.  Mild to moderate bony central stenosis associated with disc osteophyte complex.  Posterior osseous ridging is less pronounced.  C7-T1:  Severe disc degeneration.   Burke-based disc osteophyte complex and vacuum disc.  Mild to moderate bony central stenosis. Bilateral foraminal stenosis associated with uncovertebral spurring and facet arthrosis.  T1-T2 and T2-T3 show right facet arthrosis without significant bony stenosis.  IMPRESSION: 1.  Severe destructive changes of the right C1-C2 articulation greater than usually seen with degenerative arthritis.  Findings can be associated with chronic septic arthritis, particularly in the setting of prior instrumentation.  Post-gadolinium MRI of the cervical spine should be considered to assess for enhancement and adjacent fluid collections at C1-C2.  Specific attention should be directed to this region on the order as full inclusion of this area is not typically performed.  Crystal arthropathy and  rheumatoid arthritis are also considerations for the destructive change. 2.  Cranial settling. 3.  Multilevel foraminal and mild to moderate central stenosis associated with posterior osseous ridging, disc osteophyte complex, uncovertebral spurring and facet arthrosis.  Case reviewed with Dr. Benard Rink.  These results will be called to the ordering clinician or representative by the Radiologist Assistant, and communication documented in the PACS Dashboard.  Original Report Authenticated By: Andreas Newport, M.D.   Mr Cervical Spine Wo Contrast  11/12/2011  *RADIOLOGY REPORT*  Clinical Data: Neck pain for 4-5 months.  Right arm pain.  No known injury.  No history of cancer.  MRI CERVICAL SPINE WITHOUT CONTRAST  Technique:  Multiplanar and multiecho pulse sequences of the cervical spine, to include the craniocervical junction and cervicothoracic junction, were obtained according to standard protocol without intravenous contrast.  Comparison: 02/12/2011 cervical spine CT. 05/16/2010 of brain MR.  Findings: Prominent degenerative changes C1-2 articulation.  This is most notable involving the right lateral mass.  Cranial settling with superior  displacement of the dens which is partially eroded. This contributes to the spinal stenosis and crowding of the cervical medullary junction which does not appear to be significantly compressed or displaced.  Intracranial atrophy and small vessel disease type changes noted.  Visualized paravertebral structures unremarkable.  The vertebral arteries are patent with right vertebral artery congenitally small.  No focal cervical cord signal abnormality.  Congenitally narrowed cervical spinal canal with superimposed degenerative changes including:  C2-3:  Burke-based disc osteophyte complex slightly greater to the left.  Mild facet joint degenerative changes.  Uncinate bony overgrowth.  Mild bilateral foraminal narrowing greater on the left.  Mild spinal stenosis greater on the left.  C3-4:  Burke-based disc osteophyte slightly greater  to the left. Spinal stenosis greater left.  Facet joint degenerative changes. Left uncinate bony overgrowth.  Mild to moderate left foraminal narrowing.  C4-5:  Burke-based disc osteophyte.  Spinal stenosis with mild cord flattening.  Mild facet joint degenerative changes.  Uncinate bony overgrowth.  Moderate bilateral foraminal narrowing greater on the right.  C5-6:  Burke-based disc osteophyte complex.  Superimposed left paracentral caudally extending disc protrusion.  Spinal stenosis and minimal cord contact.  Uncinate bony overgrowth.  Moderate bilateral foraminal narrowing.  C6-7:  Burke-based disc osteophyte complex.  Spinal stenosis with minimal cord contact.  Uncinate bony overgrowth.  Moderate bilateral foraminal narrowing greater on the right.  C7-T1:  Burke-based disc osteophyte complex greater to the right. Focal component origin of the right neural foramen.  Uncinate bony overgrowth.  Facet joint degenerative changes.  Moderate to marked right-sided and moderate left-sided foraminal narrowing.  T1-2:  Facet joint degenerative changes.  Bulge/Burke-based protrusion with slight  cephalad extension.  Spinal stenosis and mild to moderate bilateral foraminal narrowing.  Axial images not obtained.  T2-3:  Facet joint degenerative changes.  Mild bulge.  IMPRESSION: Baseline narrowed spinal canal with superimposed degenerative changes throughout the cervical spine contributing to various degrees of spinal stenosis and foraminal narrowing as detailed above. Spinal stenosis most notable C4-5 level.  Prominent degenerative changes C1-2 articulation.  This is most notable involving the right lateral mass.  Cranial settling with superior displacement of the dens which is partially eroded.  This contributes to the spinal stenosis and crowding of the cervical medullary junction which does not appear to be significantly compressed or displaced.  Original Report Authenticated By: Fuller Canada, M.D.   Dg C-arm Gt 120 Min  11/13/2011  *RADIOLOGY REPORT*  Clinical Data: Posterior cervical fusion  DG CERVICAL SPINE - 1 VIEW,DG C-ARM GT 120 MIN  Comparison: 11/12/2011  Findings: Two spot images from intraoperative C-arm fluoroscopy document screw placement from posterior approach overlying the C1 and C2 posterior elements.  IMPRESSION:  1.  Intraoperative localization.  Original Report Authenticated By: Osa Craver, M.D.    Assessment/Plan: Diagnosis: C1-C2 decompression/fusion 1. Does the need for close, 24 hr/day medical supervision in concert with the patient's rehab needs make it unreasonable for this patient to be served in a less intensive setting? No 2. Co-Morbidities requiring supervision/potential complications: see above 3. Due to bowel management, safety and disease management, does the patient require 24 hr/day rehab nursing? No 4. Does the patient require coordinated care of a physician, rehab nurse, PT, OT to address physical and functional deficits in the context of the above medical diagnosis(es)? No Addressing deficits in the following areas: balance, endurance,  locomotion, strength, bowel/bladder control, bathing, dressing, grooming and cognition 5. Can the patient actively participate in an intensive therapy program of at least 3 hrs of therapy per day at least 5 days per week? No 6. The potential for patient to make measurable gains while on inpatient rehab is fair 7. Anticipated functional outcomes upon discharge from inpatient rehab are n/a. 8. Estimated rehab length of stay to reach the above functional goals is: n.a 9. Does the patient have adequate social supports to accommodate these discharge functional goals? No 10. Anticipated D/C setting: Home 11. Anticipated post D/C treatments: HH therapy 12. Overall Rehab/Functional Prognosis: fair  RECOMMENDATIONS: This patient's condition is appropriate for continued rehabilitative care in the following setting: SNF Patient has agreed to participate in recommended program. Potentially Note that insurance prior authorization may be required  for reimbursement for recommended care.  Comment: Pt was needing assistance with basic tasks at the ALF prior to this surgery.   Mallory Broad, MD 11/14/2011

## 2011-11-14 NOTE — Evaluation (Signed)
Occupational Therapy Evaluation Patient Details Name: Mallory Burke MRN: [id number] DOB: Jul 16, 1932 Today's Date: 11/14/2011 Time: 4098-1191 OT Time Calculation (min): 31 min  OT Assessment / Plan / Recommendation Clinical Impression  78 yo female s/p c1-2 fusion that is experiencing severe pain limiting mobility. OT to follow acutely. Recommend SNF for d/c planning    OT Assessment  Patient needs continued OT Services    Follow Up Recommendations  Skilled nursing facility    Barriers to Discharge      Equipment Recommendations  Defer to next venue    Recommendations for Other Services    Frequency  Min 2X/week    Precautions / Restrictions Precautions Precautions: Fall;Cervical   Pertinent Vitals/Pain Pain at peri area due to frequent voiding bladder    ADL  Eating/Feeding: Performed;Minimal assistance Where Assessed - Eating/Feeding: Edge of bed Grooming: Performed;Wash/dry face;Minimal assistance Where Assessed - Grooming: Supported sitting Lower Body Bathing: Performed;Moderate assistance Where Assessed - Lower Body Bathing: Supine, head of bed up Upper Body Dressing: Performed;Set up Where Assessed - Upper Body Dressing: Supine, head of bed up Ambulation Related to ADLs: pt unable to complete this during session. Requesting return to supine due to pain ADL Comments: Pt supine on arrival.Pt reports pain as a 5/10. Pt slow to process. Pt declined 3N1 transfer or the need to void. Pt supine and states "i am peeing". Linus Orn and patient made aware that bladder program of voiding every hour would benefit the patient. pt states discomfort at peri area due to frequent voiding bladder. Pt provided clean bedding. Pt slow progression and requires encouragement to participate     OT Diagnosis: Acute pain  OT Problem List: Decreased strength;Decreased knowledge of use of DME or AE;Decreased knowledge of precautions;Impaired UE functional use;Pain;Impaired balance (sitting and/or  standing);Decreased activity tolerance OT Treatment Interventions: Self-care/ADL training;DME and/or AE instruction;Therapeutic activities;Patient/family education;Balance training   OT Goals Acute Rehab OT Goals OT Goal Formulation: With patient Time For Goal Achievement: 11/28/11 Potential to Achieve Goals: Fair ADL Goals Pt Will Perform Grooming: with set-up;Sitting, chair;Supported ADL Goal: Grooming - Progress: Goal set today Pt Will Transfer to Toilet: with 2+ total assist;3-in-1;Stand pivot transfer (Pt 50%) ADL Goal: Statistician - Progress: Goal set today Pt Will Perform Toileting - Clothing Manipulation: with 2+ total assist;Sitting on 3-in-1 or toilet (pt 50%) ADL Goal: Toileting - Clothing Manipulation - Progress: Goal set today Pt Will Perform Toileting - Hygiene: Sitting on 3-in-1 or toilet;with max assist ADL Goal: Toileting - Hygiene - Progress: Goal set today  Visit Information  Last OT Received On: 11/14/11 Assistance Needed: +2 PT/OT Co-Evaluation/Treatment: Yes    Subjective Data  Subjective: "I like having a man yall are good but he is great" Patient Stated Goal: to return to [facility]   Prior Functioning  Home Living Lives With: Other (Comment) ([facility]) Available Help at Discharge: Available 24 hours/day Type of Home: Assisted living Home Access: Elevator Home Layout: One level Bathroom Shower/Tub: Walk-in Contractor: Handicapped height Bathroom Accessibility: Yes How Accessible: Accessible via walker Home Adaptive Equipment: Walker - rolling;Shower chair with back;Hand-held shower hose;Grab bars around toilet;Grab bars in shower Prior Function Level of Independence: Independent with assistive device(s);Needs assistance Needs Assistance: Dressing;Bathing Dressing: Minimal Able to Take Stairs?: No Driving: No Vocation: Retired Musician: No difficulties Dominant Hand: Right    Cognition   Overall Cognitive Status: Appears within functional limits for tasks assessed/performed Arousal/Alertness: Awake/alert Orientation Level: Disoriented to;Place;Time Behavior During Session: [facility] for  tasks performed    Extremity/Trunk Assessment Right Upper Extremity Assessment RUE ROM/Strength/Tone: Deficits RUE ROM/Strength/Tone Deficits: AROM ~45 degrees Left Upper Extremity Assessment LUE ROM/Strength/Tone: Deficits LUE ROM/Strength/Tone Deficits: ~AROM 45 degrees Right Lower Extremity Assessment RLE ROM/Strength/Tone: WFL for tasks assessed Left Lower Extremity Assessment LLE ROM/Strength/Tone: WFL for tasks assessed   Mobility Bed Mobility Bed Mobility: Sit to Supine;Supine to Sit;Sitting - Scoot to [place] of Bed Rolling Right: 3: Mod assist Rolling Left: 3: Mod assist Supine to Sit: 1: +2 Total assist;With rails;HOB elevated Supine to Sit: Patient Percentage: 10% Sitting - Scoot to Edge of Bed: 2: Max assist Sit to Supine: 1: +2 Total assist;HOB elevated;With rail Sit to Supine: Patient Percentage: 30% Details for Bed Mobility Assistance: VC for sequencing. Drawsheet used to assist pt from supine to sit as pt with trunk extension  and decreased trunk control during transfer. Pt able to assist more with sit to supine although still required +2 and drawsheet assist   Exercise    Balance Balance Balance Assessed: Yes Static Sitting Balance Static Sitting - Balance Support: Bilateral upper extremity supported;Feet supported Static Sitting - Level of Assistance: 5: Stand by assistance;2: Max assist Static Sitting - Comment/# of Minutes: Pt able to sit at EOB for 10 minutes, requiring max assist at first, progressing to SBA. Cues throughout for upright posture  End of Session OT - End of Session Activity Tolerance: Patient limited by pain Patient left: in bed;with call bell/phone within reach Nurse Communication: Mobility status   Lucile Shutters 11/14/2011, 12:22 PM Pager:  [phone]

## 2011-11-14 NOTE — Evaluation (Signed)
Physical Therapy Evaluation Patient Details Name: Mallory Burke MRN: [id number] DOB: [birth year] Today's Date: 11/14/2011 Time: 4098-1191 PT Time Calculation (min): 32 min  PT Assessment / Plan / Recommendation Clinical Impression  Pt presents with a medical diagnosis of PCF C1-2 with limited mobility secondary to pain. Pt will benefit from skilled PT in the acute care setting in order to maximize functional mobility and safety prior to d/c    PT Assessment  Patient needs continued PT services    Follow Up Recommendations  Skilled nursing facility;Supervision/Assistance - 24 hour       lEquipment Recommendations  Defer to next venue       Frequency Min 4X/week    Precautions / Restrictions Precautions Precautions: Fall;Cervical      Mobility  Bed Mobility Bed Mobility: Sit to Supine;Supine to Sit;Sitting - Scoot to [place] of Bed Rolling Right: 3: Mod assist Rolling Left: 3: Mod assist Supine to Sit: 1: +2 Total assist;With rails;HOB elevated Supine to Sit: Patient Percentage: 10% Sitting - Scoot to Edge of Bed: 2: Max assist Sit to Supine: 1: +2 Total assist;HOB elevated;With rail Sit to Supine: Patient Percentage: 30% Details for Bed Mobility Assistance: VC for sequencing. Drawsheet used to assist pt from supine to sit as pt with trunk extension  and decreased trunk control during transfer. Pt able to assist more with sit to supine although still required +2 and drawsheet assist Transfers Transfers: Not assessed (pt requested to lay back down, too much pain)    Exercises     PT Diagnosis: Acute pain  PT Problem List: Decreased strength;Decreased activity tolerance;Decreased balance;Decreased mobility;Decreased knowledge of use of DME;Decreased safety awareness;Decreased knowledge of precautions;Pain PT Treatment Interventions: DME instruction;Gait training;Functional mobility training;Therapeutic activities;Therapeutic exercise;Balance training;Neuromuscular  re-education;Patient/family education   PT Goals Acute Rehab PT Goals PT Goal Formulation: With patient Time For Goal Achievement: 11/21/11 Potential to Achieve Goals: Fair Pt will Roll Supine to Right Side: with modified independence PT Goal: Rolling Supine to Right Side - Progress: Goal set today Pt will Roll Supine to Left Side: with modified independence PT Goal: Rolling Supine to Left Side - Progress: Goal set today Pt will go Supine/Side to Sit: with min assist PT Goal: Supine/Side to Sit - Progress: Goal set today Pt will go Sit to Supine/Side: with min assist PT Goal: Sit to Supine/Side - Progress: Goal set today Pt will go Sit to Stand: with min assist PT Goal: Sit to Stand - Progress: Goal set today Pt will go Stand to Sit: with min assist PT Goal: Stand to Sit - Progress: Goal set today Pt will Transfer Bed to Chair/Chair to Bed: with min assist PT Transfer Goal: Bed to Chair/Chair to Bed - Progress: Goal set today Pt will Ambulate: 51 - 150 feet;with min assist;with least restrictive assistive device PT Goal: Ambulate - Progress: Goal set today  Visit Information  Last PT Received On: 11/14/11 Assistance Needed: +2 PT/OT Co-Evaluation/Treatment: Yes       Prior Functioning  Home Living Lives With: Other (Comment) ([facility]) Available Help at Discharge: Available 24 hours/day Type of Home: Assisted living Home Access: Elevator Home Layout: One level Bathroom Shower/Tub: Walk-in Contractor: Handicapped height Bathroom Accessibility: Yes How Accessible: Accessible via walker Home Adaptive Equipment: Walker - rolling;Shower chair with back;Hand-held shower hose;Grab bars around toilet;Grab bars in shower Prior Function Level of Independence: Independent with assistive device(s);Needs assistance Needs Assistance: Dressing;Bathing Dressing: Minimal Able to Take Stairs?: No Driving: No Vocation: Retired Musician:  No  difficulties Dominant Hand: Right    Cognition  Overall Cognitive Status: Appears within functional limits for tasks assessed/performed Arousal/Alertness: Awake/alert Orientation Level: Disoriented to;Place;Time Behavior During Session: [facility] for tasks performed    Extremity/Trunk Assessment Right Upper Extremity Assessment RUE ROM/Strength/Tone: Deficits Left Upper Extremity Assessment LUE ROM/Strength/Tone: Deficits Right Lower Extremity Assessment RLE ROM/Strength/Tone: WFL for tasks assessed Left Lower Extremity Assessment LLE ROM/Strength/Tone: WFL for tasks assessed   Balance Balance Balance Assessed: Yes Static Sitting Balance Static Sitting - Balance Support: Bilateral upper extremity supported;Feet supported Static Sitting - Level of Assistance: 5: Stand by assistance;2: Max assist Static Sitting - Comment/# of Minutes: Pt able to sit at EOB for 10 minutes, requiring max assist at first, progressing to SBA. Cues throughout for upright posture  End of Session PT - End of Session Activity Tolerance: Patient limited by pain Patient left: in bed;with call bell/phone within reach;with nursing in room Nurse Communication: Mobility status   Milana Kidney 11/14/2011, 12:07 PM  11/14/2011 Milana Kidney DPT PAGER: [phone] OFFICE: [phone]

## 2011-11-15 ENCOUNTER — Encounter (HOSPITAL_COMMUNITY): Payer: Self-pay | Admitting: Neurosurgery

## 2011-11-15 NOTE — Clinical Social Work Note (Signed)
CSW met with pt and family to provide bed offers for SNF placement. Pt shared that she would like to look over the list of offers and follow up on a choice. CSW will follow up with pt and family to determine bed choice. CSW will continue to follow to facilitate discharge to SNF.   Dede Query, MSW, Theresia Majors [phone]

## 2011-11-15 NOTE — Progress Notes (Signed)
Physical Therapy Treatment Patient Details Name: Tinya Cadogan MRN: [id number] DOB: Feb 02, 1933 Today's Date: 11/15/2011 Time: 0865-7846 PT Time Calculation (min): 30 min  PT Assessment / Plan / Recommendation Comments on Treatment Session  Patient progressing well. Able to ambulate today.     Follow Up Recommendations  Skilled nursing facility;Supervision/Assistance - 24 hour    Barriers to Discharge        Equipment Recommendations  Defer to next venue    Recommendations for Other Services    Frequency Min 4X/week   Plan Discharge plan remains appropriate    Precautions / Restrictions Precautions Precautions: Fall;Cervical   Pertinent Vitals/Pain     Mobility  Bed Mobility Supine to Sit: 1: +2 Total assist Supine to Sit: Patient Percentage: 60% Sitting - Scoot to Edge of Bed: 2: Max assist Details for Bed Mobility Assistance: Patient required A to lift shoulders/back up to sitting. CUes for positioning and technique. Patient utilized hand to pull up into sitting. A with chuck pad for scooting EOB Transfers Transfers: Sit to Stand;Stand to Sit Sit to Stand: 1: +2 Total assist;From bed;From chair/3-in-1 Sit to Stand: Patient Percentage: 60% Stand to Sit: 1: +2 Total assist;To chair/3-in-1 Stand to Sit: Patient Percentage: 60% Details for Transfer Assistance: A to initiate stand and to shift weight anteriorly over BOS. A to sit slowly. Praticed x3 Ambulation/Gait Ambulation/Gait Assistance: 1: +2 Total assist Ambulation/Gait: Patient Percentage: 50% Ambulation Distance (Feet): 20 Feet Assistive device: 2 person hand held assist Ambulation/Gait Assistance Details: A for balance. Cues for LE placement. Gait Pattern: Step-to pattern;Shuffle Gait velocity: decreased    Exercises     PT Diagnosis:    PT Problem List:   PT Treatment Interventions:     PT Goals Acute Rehab PT Goals PT Goal: Supine/Side to Sit - Progress: Progressing toward goal PT Goal: Sit to  Stand - Progress: Progressing toward goal PT Goal: Stand to Sit - Progress: Progressing toward goal PT Transfer Goal: Bed to Chair/Chair to Bed - Progress: Progressing toward goal PT Goal: Ambulate - Progress: Progressing toward goal  Visit Information  Last PT Received On: 11/15/11 Assistance Needed: +2    Subjective Data  Subjective: Let me do it myself. Referring to bed mobility   Cognition  Overall Cognitive Status: Appears within functional limits for tasks assessed/performed Arousal/Alertness: Awake/alert Orientation Level: Appears intact for tasks assessed Behavior During Session: [facility] for tasks performed    Balance     End of Session PT - End of Session Equipment Utilized During Treatment: Gait belt Activity Tolerance: Patient tolerated treatment well Patient left: in chair;with call bell/phone within reach Nurse Communication: Mobility status    Fredrich Birks 11/15/2011, 12:16 PM 11/15/2011 Fredrich Birks PTA [phone] pager [phone] office

## 2011-11-15 NOTE — Progress Notes (Signed)
Patient ID: Mallory Burke, female   DOB: 28-Oct-1932, 78 y.o.   MRN: [id number] BP 149/78  Pulse 65  Temp(Src) 98.8 F (37.1 C) (Oral)  Resp 18  Ht 5\' 5"  (1.651 m)  Wt 95.4 kg (210 lb 5.1 oz)  BMI 35.00 kg/m2  SpO2 93% Alert and oriented x 4. Moving all extremities Wound clean and dry, no signs of infection. Cont pt and Ot

## 2011-11-16 NOTE — Progress Notes (Signed)
Doing well. C/o appropriate incisional soreness. Refusing to get oob today  Temp:  [98 F (36.7 C)-99.9 F (37.7 C)] 98.4 F (36.9 C) (05/11 0600) Pulse Rate:  [60-72] 60  (05/11 0600) Resp:  [18] 18  (05/11 0600) BP: (136-173)/(62-85) 173/85 mmHg (05/11 0600) SpO2:  [93 %-100 %] 96 % (05/11 0600) Moves all 4 well Incision   - c/d/i Plan: Increase activity - await placement

## 2011-11-17 ENCOUNTER — Inpatient Hospital Stay (HOSPITAL_COMMUNITY): Payer: Medicare Other

## 2011-11-17 LAB — DIFFERENTIAL
Basophils Absolute: 0 10*3/uL (ref 0.0–0.1)
Basophils Relative: 0 % (ref 0–1)
Eosinophils Absolute: 0 10*3/uL (ref 0.0–0.7)
Eosinophils Relative: 0 % (ref 0–5)
Lymphocytes Relative: 13 % (ref 12–46)
Lymphs Abs: 1.2 10*3/uL (ref 0.7–4.0)
Monocytes Absolute: 0.9 10*3/uL (ref 0.1–1.0)
Monocytes Relative: 10 % (ref 3–12)
Neutro Abs: 6.9 10*3/uL (ref 1.7–7.7)
Neutrophils Relative %: 77 % (ref 43–77)

## 2011-11-17 LAB — CBC
HCT: 36.8 % (ref 36.0–46.0)
Hemoglobin: 12.5 g/dL (ref 12.0–15.0)
MCH: 31.3 pg (ref 26.0–34.0)
MCHC: 34 g/dL (ref 30.0–36.0)
MCV: 92.2 fL (ref 78.0–100.0)
Platelets: 206 10*3/uL (ref 150–400)
RBC: 3.99 MIL/uL (ref 3.87–5.11)
RDW: 13 % (ref 11.5–15.5)
WBC: 9 10*3/uL (ref 4.0–10.5)

## 2011-11-17 NOTE — Progress Notes (Signed)
PATIENT HAS BEEN FEBRILE THIS TOUR, WAS FIRST MEDICATED WITH 2 PERCOCET/TYLENOL BRINGING TEMPERATURE TO 98, BUT TEMPERATURE SUBSEQUENTLY INCREASED TO 100.7 @ 0700.  MD MADE AWARE AND ORDERS NOTED.

## 2011-11-17 NOTE — Progress Notes (Signed)
Filed Vitals:   11/16/11 2027 11/16/11 2100 11/17/11 0202 11/17/11 0609  BP: 174/74  150/80 157/79  Pulse: 96  71 71  Temp: 102 F (38.9 C) 99.5 F (37.5 C) 98.4 F (36.9 C) 100.7 F (38.2 C)  TempSrc: Oral Oral Oral Oral  Resp: 19  19 19   Height:      Weight:      SpO2: 90%  96% 93%    CBC  Basename 11/17/11 0946  WBC 9.0  HGB 12.5  HCT 36.8  PLT 206    Patient resting in bed, continues to complain of discomfort. Mobility has been extremely limited. She has not been out of bed. She did have a fever this morning to 102. I ordered urinalysis and urine culture, which has not yet been sent and I ordered a AP portable chest x-ray, which the nurse put in as a abdominal x-ray. The supervisor has been informed and is going to correct the orders. She does have incentive spirometry, and claims that she is using it, and I again emphasized the continued use to her. Continuing PT and OT.  Plan: Continued to encourage patient to progress and mobilized. UA and urine culture and chest x-ray to be done, continue PT and OT.  Hewitt Shorts, MD 11/17/2011, 10:38 AM

## 2011-11-17 NOTE — Progress Notes (Signed)
Patient received awake, alert to self, but confused to place; capable of verbalizing needs; maximum assist  With 2 assistants during transfer from lying to sitting and standing @ bedside; patient moved only 2 steps and started to throw her weight on assistants- very unsteady and unable to lift right leg-put back to bed after several attempts at trying to get her to ambulate.

## 2011-11-17 NOTE — Progress Notes (Signed)
Patient't more alert- knows that she is @ [facility] with 0400 assessment.

## 2011-11-18 LAB — URINALYSIS, ROUTINE W REFLEX MICROSCOPIC
Glucose, UA: NEGATIVE mg/dL
Hgb urine dipstick: NEGATIVE
Ketones, ur: 15 mg/dL — AB
Leukocytes, UA: NEGATIVE
Nitrite: NEGATIVE
Protein, ur: NEGATIVE mg/dL
Specific Gravity, Urine: 1.023 (ref 1.005–1.030)
Urobilinogen, UA: 1 mg/dL (ref 0.0–1.0)
pH: 6 (ref 5.0–8.0)

## 2011-11-18 NOTE — Progress Notes (Signed)
Physical Therapy Treatment Patient Details Name: Mallory Burke MRN: [id number] DOB: Nov 09, 1932 Today's Date: 11/18/2011 Time: 1191-4782 PT Time Calculation (min): 24 min  PT Assessment / Plan / Recommendation Comments on Treatment Session  Patient progressing well. Able to ambulate today.  She needs to be encouranged to do for herself, she wants to be assisted with eating etc.    Follow Up Recommendations  Skilled nursing facility;Supervision/Assistance - 24 hour    Barriers to Discharge        Equipment Recommendations  Defer to next venue    Recommendations for Other Services    Frequency Min 4X/week   Plan Discharge plan remains appropriate    Precautions / Restrictions Precautions Precautions: Fall;Cervical Required Braces or Orthoses: Cervical Brace   Pertinent Vitals/Pain     Mobility  Bed Mobility Bed Mobility: Rolling Right;Right Sidelying to Sit;Sitting - Scoot to [place] of Bed Rolling Right: 3: Mod assist Right Sidelying to Sit: 3: Mod assist Sitting - Scoot to Edge of Bed: 3: Mod assist Details for Bed Mobility Assistance: vc's to get pt to initiate, log roll technique, hand placement; truncal assist/ w/shift and scooting assist Transfers Transfers: Sit to Stand;Stand to Sit Sit to Stand: 1: +2 Total assist;From bed;From chair/3-in-1 Sit to Stand: Patient Percentage: 60% Stand to Sit: 1: +2 Total assist;To chair/3-in-1 Stand to Sit: Patient Percentage: 60% Details for Transfer Assistance: A to initiate stand and to shift weight anteriorly over BOS. VC's for hand placement/technique.  A to control sit. Ambulation/Gait Ambulation/Gait Assistance: 1: +2 Total assist Ambulation/Gait: Patient Percentage: 50% Ambulation Distance (Feet): 5 Feet Assistive device: Rolling walker Ambulation/Gait Assistance Details: step to gait with unequal step length.  Assist for w/shift and vc's for safe technique/ Gait Pattern: Step-to pattern;Shuffle;Decreased stance time -  left;Decreased step length - right;Decreased stride length Stairs: No    Exercises     PT Diagnosis:    PT Problem List:   PT Treatment Interventions:     PT Goals Acute Rehab PT Goals Time For Goal Achievement: 11/21/11 Potential to Achieve Goals: Fair PT Goal: Rolling Supine to Right Side - Progress: Progressing toward goal PT Goal: Rolling Supine to Left Side - Progress: Progressing toward goal PT Goal: Supine/Side to Sit - Progress: Progressing toward goal PT Goal: Sit to Stand - Progress: Progressing toward goal PT Goal: Stand to Sit - Progress: Progressing toward goal PT Transfer Goal: Bed to Chair/Chair to Bed - Progress: Met PT Goal: Ambulate - Progress: Progressing toward goal  Visit Information  Last PT Received On: 11/18/11 Assistance Needed: +2    Subjective Data  Subjective: You're hurtin me.Marland KitchenMarland KitchenI can't do it.Marland KitchenMarland KitchenThey fed me the other day.   Cognition  Overall Cognitive Status: Appears within functional limits for tasks assessed/performed Arousal/Alertness: Awake/alert Orientation Level: Appears intact for tasks assessed Behavior During Session: [facility] for tasks performed    Balance  Balance Balance Assessed: Yes Static Sitting Balance Static Sitting - Balance Support: Bilateral upper extremity supported;Feet supported Static Sitting - Level of Assistance: 3: Mod assist Static Sitting - Comment/# of Minutes: 4  End of Session PT - End of Session Activity Tolerance: Patient tolerated treatment well Patient left: in chair;with call bell/phone within reach Nurse Communication: Mobility status    Berenis Corter, Eliseo Gum 11/18/2011, 1:00 PM  11/18/2011  Kingman Bing, PT [phone] (pager)

## 2011-11-18 NOTE — Progress Notes (Signed)
Patient ID: Mallory Burke, female   DOB: [birth year], 78 y.o.   MRN: [id number] BP 140/70  Pulse 60  Temp(Src) 98 F (36.7 C) (Oral)  Resp 18  Ht 5\' 5"  (1.651 m)  Wt 95.4 kg (210 lb 5.1 oz)  BMI 35.00 kg/m2  SpO2 99% Alert and oriented x 4 Moving all extremities well Complains of pain throughout her body, feels cold, feels like she does not have good circulation. Has been afebrile x24 hrs at least. WBC is not elevated, O2 sats good. Continue with Pt,Ot. She is not the most energetic, will continue to encourage activity.

## 2011-11-19 LAB — URINE CULTURE
Colony Count: NO GROWTH
Culture  Setup Time: 201305131131
Culture: NO GROWTH

## 2011-11-19 NOTE — Progress Notes (Signed)
BP 140/67  Pulse 50  Temp(Src) 98.2 F (36.8 C) (Oral)  Resp 18  Ht 5\' 5"  (1.651 m)  Wt 95.4 kg (210 lb 5.1 oz)  BMI 35.00 kg/m2  SpO2 100% Alert and oriented x 4.  Wound is clean, dry and without signs of infection. Moving all extremities. Pt/OT remain vital.

## 2011-11-19 NOTE — Clinical Social Work Note (Signed)
CSW spoke with pt regarding discharge plan. Pt was agreeable to for this CSW to speak to her daughter regarding SNF choice. Pt's daughter chose [facility] in [place]. CSW confirmed with facility that bed is available tomorrow, 11/20/11. CSW will continue to follow to facilitate discharge to [facility] 11/20/11.   Dede Query, MSW, Theresia Majors [phone]

## 2011-11-19 NOTE — Progress Notes (Signed)
Physical Therapy Treatment Patient Details Name: Mallory Burke MRN: [id number] DOB: 1932-10-24 Today's Date: 11/19/2011 Time: 8295-6213 PT Time Calculation (min): 23 min  PT Assessment / Plan / Recommendation Comments on Treatment Session  Patient requires max encouragement to participate with therapy. Patient stating that she hadn't gotten pain meds and when spoke to RN patient had recieved all the meds that she could at that time. Patient progressing slowly with ambulation    Follow Up Recommendations  Skilled nursing facility    Barriers to Discharge        Equipment Recommendations  Defer to next venue    Recommendations for Other Services    Frequency Min 4X/week   Plan Discharge plan remains appropriate;Frequency remains appropriate    Precautions / Restrictions Precautions Precautions: Fall;Cervical Required Braces or Orthoses: Cervical Brace Cervical Brace: Hard collar   Pertinent Vitals/Pain 7/10 neck pain. Patient premedicated and RN made aware    Mobility  Bed Mobility Rolling Right: 4: Min assist;With rail Right Sidelying to Sit: 3: Mod assist;With rails Details for Bed Mobility Assistance: Cues to initiate rolling, tactile cues for hand placement and A for trunk positioning into sitting as patient tries to pause midway vs. following through movement Transfers Sit to Stand: 1: +2 Total assist;With upper extremity assist;From bed Sit to Stand: Patient Percentage: 60% Stand to Sit: 1: +2 Total assist;With upper extremity assist;With armrests;To chair/3-in-1 Stand to Sit: Patient Percentage: 60% Details for Transfer Assistance: Patient stood x2 with A to initiate anterior weight shift and to position LEs prior to stand and to block from shifting anteriorly. Patient with heavy posterior lean on first stand. Patient given visual cues for safe positioning to prevent posterior lean Ambulation/Gait Ambulation/Gait Assistance: 1: +2 Total assist Ambulation/Gait:  Patient Percentage: 60% Ambulation Distance (Feet): 15 Feet Assistive device: Rolling walker Ambulation/Gait Assistance Details: A with RW. Patient switching from step to to step through gait pattern. Patient inconsistent with step length. Cues and A with RW management Gait Pattern: Step-to pattern;Step-through pattern;Decreased stride length;Trunk flexed    Exercises     PT Diagnosis:    PT Problem List:   PT Treatment Interventions:     PT Goals Acute Rehab PT Goals PT Goal: Rolling Supine to Left Side - Progress: Progressing toward goal PT Goal: Supine/Side to Sit - Progress: Progressing toward goal PT Goal: Sit to Stand - Progress: Progressing toward goal PT Goal: Stand to Sit - Progress: Progressing toward goal PT Transfer Goal: Bed to Chair/Chair to Bed - Progress: Progressing toward goal PT Goal: Ambulate - Progress: Progressing toward goal  Visit Information  Last PT Received On: 11/19/11 Assistance Needed: +2    Subjective Data  Subjective: I hurt so bad   Cognition  Overall Cognitive Status: Appears within functional limits for tasks assessed/performed Arousal/Alertness: Awake/alert Orientation Level: Appears intact for tasks assessed Behavior During Session: [facility] for tasks performed    Balance     End of Session PT - End of Session Equipment Utilized During Treatment: Cervical collar;Gait belt Activity Tolerance: Patient tolerated treatment well;Patient limited by fatigue Patient left: in chair;with call bell/phone within reach Nurse Communication: Mobility status    Fredrich Birks 11/19/2011, 12:06 PM 11/19/2011 Fredrich Birks PTA [phone] pager [phone] office

## 2011-11-20 NOTE — Plan of Care (Signed)
Problem: Phase II Progression Outcomes Goal: Progress activity as tolerated unless otherwise ordered Outcome: Not Met (add Reason) Pt is still refusing to do things on her own,even insists on being fed.

## 2011-11-20 NOTE — Discharge Summary (Signed)
Physician Discharge Summary  Patient ID: Mallory Burke MRN: [id number] DOB/AGE: 1932-11-27 78 y.o.  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 I placed instrumentation C2 pedicle, and C1 lateral mass screws on the left side. I then wired in sublaminar fashion an allograft between c1 and c2. Post op she has had no neurologic deficits. Wound is clean and dry. She is at her baseline neurologically able to move all extremities . She is very weak in her lower extremities at baseline. She still has a foley, has tolerated a regular diet.  Consults: None  Significant Diagnostic Studies: ua which was negative  Treatments: surgery: C1-2 posterior arthrodesis, structural allograft, [employer]  Posterior instrumentation Lateral mass C1 screw, Left C2 pedicle screw   Discharge Exam: Blood pressure 125/77, pulse 58, temperature 98.1 F (36.7 C), temperature source Oral, resp. rate 18, height 5\' 5"  (1.651 m), weight 95.4 kg (210 lb 5.1 oz), SpO2 95.00%. General appearance: alert, cooperative and appears stated age Peerl, full eom, symmetric facies. Moves all extremities ~5--4+/5 Wound clean and dry  Disposition: 03-Skilled Nursing Facility   Medication List  As of 11/20/2011  1:39 PM   STOP taking these medications         celecoxib 200 MG capsule      traMADol 50 MG tablet         TAKE these medications         albuterol 108 (90 BASE) MCG/ACT inhaler   Commonly known as: PROVENTIL HFA;VENTOLIN HFA   Inhale 2 puffs into the lungs every 4 (four) hours as needed. For cough      benzonatate 200 MG capsule   Commonly  known as: TESSALON   Take 200 mg by mouth every 8 (eight) hours as needed. For cough      citalopram 10 MG tablet   Commonly known as: CELEXA   Take 10 mg by mouth daily.      donepezil 10 MG tablet   Commonly known as: ARICEPT   Take 10 mg by mouth daily.      hydrochlorothiazide 12.5 MG capsule   Commonly known as: MICROZIDE   Take 12.5 mg by mouth daily.      HYDROcodone-acetaminophen 10-325 MG per tablet   Commonly known as: NORCO   Take 1 tablet by mouth every 6 (six) hours as needed. For pain      hydrocortisone 2.5 % cream   Apply topically 2 (two) times daily. For irritation      levothyroxine 150 MCG tablet   Commonly known as: SYNTHROID, LEVOTHROID   Take 150 mcg by mouth daily.      loperamide 2 MG capsule   Commonly known as: IMODIUM   Take 2 mg by mouth every 6 (six) hours as needed. For diarrhea      LORazepam 0.5 MG tablet   Commonly known as: ATIVAN   Take 0.5 mg by mouth 3 (three) times daily.      methocarbamol 750 MG tablet   Commonly known as: ROBAXIN   Take 750 mg by mouth at bedtime as needed.      mometasone 50 MCG/ACT nasal spray   Commonly known as: NASONEX   Place 2 sprays into the nose  daily.      omeprazole 20 MG capsule   Commonly known as: PRILOSEC   Take 20 mg by mouth daily.      rosuvastatin 20 MG tablet   Commonly known as: CRESTOR   Take 20 mg by mouth daily.           Follow-up Information    Follow up with Chikita Dogan L, MD. (call to make appt, will need ap and lateral cervical spine films for visit.)    Contact information:   [address], [address] [place] [address] [phone]          Signed: Carmela Hurt 11/20/2011, 1:39 PM

## 2011-11-20 NOTE — Clinical Social Work Note (Signed)
Pt was discussed in Long Length of Stay Meeting.   Pt is agreeable to discharge today. CSW informed pt' daughter and provide number to facility to complete admission paperwork. CSW confirmed with [facility] that bed is available today. CSW notified MD of discharge. CSW will continue to follow to facilitate discharge to [facility] today, 11/20/2011.   Dede Query, MSW, [facility]ors [phone]

## 2011-11-20 NOTE — Clinical Social Work Note (Signed)
Pt is ready for discharge to [facility]. Facility has received discharge summary and is ready to accept pt. Pt and family are agreeable to discharge plan. [facility] will provide transportation to facility. CSW is signing off as no further needs identified.   Dede Query, MSW, [facility] [phone]

## 2011-11-20 NOTE — Progress Notes (Signed)
Physical Therapy Treatment Patient Details Name: Mallory Burke MRN: [id number] DOB: 1933-04-16 Today's Date: 11/20/2011 Time: 1000-1025 PT Time Calculation (min): 25 min  PT Assessment / Plan / Recommendation Comments on Treatment Session  Patient still requiring max encouragement initially to engage in therapy however once up patient more willing to ambulate and progressed well with ambulation today.     Follow Up Recommendations  Skilled nursing facility    Barriers to Discharge        Equipment Recommendations  Defer to next venue    Recommendations for Other Services    Frequency Min 4X/week   Plan Discharge plan remains appropriate    Precautions / Restrictions Precautions Precautions: Fall;Cervical Precaution Comments: Patient requesting to take collar off. Patient educated on need for brace and on cervical precautions Cervical Brace: Hard collar   Pertinent Vitals/Pain 7/10 neck/head pain. Patient premedicated     Mobility  Bed Mobility Supine to Sit: 3: Mod assist;With rails Sitting - Scoot to Edge of Bed: 3: Mod assist;With rail Details for Bed Mobility Assistance: A required to follow through with supine to sit and to elevate trunk and shift hips to upright sitting. Cues for efficient scooting technique Transfers Sit to Stand: 1: +2 Total assist;From bed;With upper extremity assist;With armrests;From chair/3-in-1 Sit to Stand: Patient Percentage: 70% Stand to Sit: To chair/3-in-1;To bed;With armrests;With upper extremity assist Stand to Sit: Patient Percentage: 70% Details for Transfer Assistance: A to initiate stand and ensure safe techniqie. Patient initially with posterior lean on first stand but able to correct with last 2 stands. Cues for controlled descent to sitting.  Ambulation/Gait Ambulation/Gait Assistance: 3: Mod assist Ambulation Distance (Feet): 80 Feet Assistive device: Rolling walker Ambulation/Gait Assistance Details: Patient able to ambulate  19ft then 82ft with A for management of RW and balance. +1 for chair. Cues for positioning within RW and for upright posture.  Gait Pattern: Decreased stride length;Step-through pattern;Wide base of support;Trunk flexed Gait velocity: decreased    Exercises     PT Diagnosis:    PT Problem List:   PT Treatment Interventions:     PT Goals Acute Rehab PT Goals PT Goal: Supine/Side to Sit - Progress: Progressing toward goal PT Goal: Sit to Stand - Progress: Progressing toward goal PT Goal: Stand to Sit - Progress: Progressing toward goal PT Transfer Goal: Bed to Chair/Chair to Bed - Progress: Progressing toward goal PT Goal: Ambulate - Progress: Progressing toward goal  Visit Information  Last PT Received On: 11/20/11 Assistance Needed: +2    Subjective Data  Subjective: I walked that far   Cognition  Overall Cognitive Status: Appears within functional limits for tasks assessed/performed Arousal/Alertness: Awake/alert Orientation Level: Appears intact for tasks assessed Behavior During Session: [facility] for tasks performed    Balance     End of Session PT - End of Session Equipment Utilized During Treatment: Gait belt Activity Tolerance: Patient tolerated treatment well Patient left: in chair;with call bell/phone within reach Nurse Communication: Mobility status    Fredrich Birks 11/20/2011, 11:52 AM 11/20/2011 Fredrich Birks PTA [phone] pager [phone] office

## 2011-11-20 NOTE — Discharge Summary (Signed)
SL removed, no bleeding noted, catheter tip intact.  EMS given d/c instructions along with prescription for percocet.  Pt transferred from bed to stretcher x 3 assists.  Did not tolerate well.  Pt progressing very slowly; refusing to feed herself and pulls and holds onto staff with transfers.  Report called to [facility] per Rema Fendt, RN.

## 2011-11-20 NOTE — Discharge Instructions (Signed)
Wear collar round the clock for the next month.  You may shower, be a passenger in a car, and walk as much as you would like.  You should not scrub the incision, but it may get wet. Simply pat it dry. I will see you in approximately one month.

## 2011-11-30 ENCOUNTER — Emergency Department: Payer: Self-pay | Admitting: Emergency Medicine

## 2011-12-12 ENCOUNTER — Emergency Department: Payer: Self-pay | Admitting: *Deleted

## 2011-12-20 ENCOUNTER — Telehealth: Payer: Self-pay | Admitting: *Deleted

## 2011-12-20 NOTE — Telephone Encounter (Signed)
Noted. Thanks.

## 2011-12-20 NOTE — Telephone Encounter (Signed)
Pt was readmitted to [facility] facility 2 days ago. Pt had cervical fusion of C1 & C2. Needs order for hospital bed. Regular bed is not working for pt. Needs rx called to [facility] at [phone]   Fax [phone] for Norco 10-325mg  1 every 4 hours as needed for pain. Archie Patten reports that pt c/o 1 tablet not covering her pain.   Fax orders to the home at [phone].  Please advise.

## 2011-12-20 NOTE — Telephone Encounter (Signed)
Ok to call in verbal order for hospital bed.  If pt is having difficulty controlling her surgical pain, they need to contact her surgeon.  No script at this time.

## 2011-12-20 NOTE — Telephone Encounter (Signed)
Notified Archie Patten, she states the pt's family has decided to try a lower mattress before getting a hospital bed. She will contact us in the future if the lower mattress does not work out. Archie Patten will contact surgeon for management of pain medications.

## 2011-12-23 ENCOUNTER — Other Ambulatory Visit: Payer: Self-pay

## 2011-12-23 NOTE — Telephone Encounter (Signed)
Please check when last script was sent and how many.  i'm having a hard time figuring this out when looking at the chart

## 2011-12-23 NOTE — Telephone Encounter (Signed)
Call form Tonya RN at [facility] and the patient is requesting a refill on Ativan. She would like an Rx faxed to [facility] at [phone]. [facility]'s fax # is [phone].     KP

## 2011-12-24 NOTE — Telephone Encounter (Signed)
Noted last refills were discontinued by [facility] Tech on 09-09-11, contacted [facility] and was advised that this pt per with [facility] facility needs to have Rx's sent to [facility] also noted as [facility]  [phone] however they are not listed in our system, looked through all states and placed address in chart to locate with no resolve was advised by rep May that they are located at [facility] [place], no [place] pharmacy noted in system for [facility] rep advised that this a a Secretary/administrator for [place], asked about other pharmacies listed in chart from the past,per noted [facility] in [place] and [facility], was noted all will go secondary to the correct facility and may not reach the correct pharmacy  she did advise the last refill was noted on 10-07-11 for #45 with no refills per pt taking TID , noted a pending refill on 12-17-11 for pt hard script, can note fax number given by facility per Archie Patten RN, please advise

## 2011-12-26 DIAGNOSIS — Z0279 Encounter for issue of other medical certificate: Secondary | ICD-10-CM

## 2011-12-26 NOTE — Telephone Encounter (Signed)
Ok for #90, 3 refills 

## 2011-12-27 MED ORDER — LORAZEPAM 0.5 MG PO TABS: 0.5000 mg | ORAL_TABLET | Freq: Three times a day (TID) | ORAL | Status: DC

## 2011-12-27 NOTE — Telephone Encounter (Signed)
Faxed to number provided

## 2011-12-27 NOTE — Addendum Note (Signed)
Addended by: Derry Lory A on: 12/27/2011 12:02 PM   Modules accepted: Orders

## 2012-01-06 ENCOUNTER — Emergency Department (HOSPITAL_COMMUNITY): Payer: Medicare Other

## 2012-01-06 ENCOUNTER — Emergency Department (HOSPITAL_COMMUNITY)
Admission: EM | Admit: 2012-01-06 | Discharge: 2012-01-07 | Disposition: A | Discharge status: Home / Self Care | Payer: Medicare Other | Attending: Emergency Medicine | Admitting: Emergency Medicine

## 2012-01-06 ENCOUNTER — Encounter (HOSPITAL_COMMUNITY): Payer: Self-pay | Admitting: *Deleted

## 2012-01-06 DIAGNOSIS — Z79899 Other long term (current) drug therapy: Secondary | ICD-10-CM | POA: Insufficient documentation

## 2012-01-06 DIAGNOSIS — M25519 Pain in unspecified shoulder: Secondary | ICD-10-CM | POA: Insufficient documentation

## 2012-01-06 DIAGNOSIS — W050XXA Fall from non-moving wheelchair, initial encounter: Secondary | ICD-10-CM | POA: Insufficient documentation

## 2012-01-06 DIAGNOSIS — M545 Low back pain, unspecified: Secondary | ICD-10-CM | POA: Insufficient documentation

## 2012-01-06 DIAGNOSIS — Y999 Unspecified external cause status: Secondary | ICD-10-CM | POA: Insufficient documentation

## 2012-01-06 DIAGNOSIS — M25559 Pain in unspecified hip: Secondary | ICD-10-CM | POA: Insufficient documentation

## 2012-01-06 DIAGNOSIS — S0990XA Unspecified injury of head, initial encounter: Secondary | ICD-10-CM | POA: Insufficient documentation

## 2012-01-06 DIAGNOSIS — E039 Hypothyroidism, unspecified: Secondary | ICD-10-CM | POA: Insufficient documentation

## 2012-01-06 DIAGNOSIS — Z981 Arthrodesis status: Secondary | ICD-10-CM | POA: Insufficient documentation

## 2012-01-06 DIAGNOSIS — Y9210 Unspecified residential institution as the place of occurrence of the external cause: Secondary | ICD-10-CM | POA: Insufficient documentation

## 2012-01-06 DIAGNOSIS — R51 Headache: Secondary | ICD-10-CM | POA: Insufficient documentation

## 2012-01-06 DIAGNOSIS — W19XXXA Unspecified fall, initial encounter: Secondary | ICD-10-CM

## 2012-01-06 MED ORDER — HYDROMORPHONE HCL PF 1 MG/ML IJ SOLN
1.0000 mg | Freq: Once | INTRAMUSCULAR | Status: AC
  Administered 2012-01-06: 1 mg via INTRAMUSCULAR
  Filled 2012-01-06: qty 1

## 2012-01-06 NOTE — ED Provider Notes (Signed)
History     CSN: [id number]  Arrival date & time 01/06/12  3086   First MD Initiated Contact with Patient 01/06/12 2007      Chief Complaint  Patient presents with  . Fall    (Consider location/radiation/quality/duration/timing/severity/associated sxs/prior treatment) HPI Comments: Patient is elderly female from [facility] who was trying move from her wheelchair to use her walker to go to the bathroom, she she slipped from the wheelchair and fell with her head under the wheelchair - she just had cervical fusion 1 month ago and wears a philly collar.  She denies LOC but reports headache, lower back, left hip pain, right shoulder pain - she is able to move all extremities and denies numbness, tingling, bladder or bowel incontinence - denies neck pain at this time.  Patient is a 78 y.o. female presenting with fall. The history is provided by the patient. No language interpreter was used.  Fall The accident occurred 1 to 2 hours ago. Incident: transfering from wheelchair. She fell from a height of 1 to 2 ft. She landed on a hard floor. There was no blood loss. The point of impact was the head, left hip and right shoulder. The pain is present in the head, left hip and right shoulder. The pain is at a severity of 10/10. The pain is severe. She was not ambulatory at the scene. There was no entrapment after the fall. There was no drug use involved in the accident. There was no alcohol use involved in the accident. Associated symptoms include headaches. Pertinent negatives include no visual change, no fever, no numbness, no abdominal pain, no bowel incontinence, no nausea, no vomiting, no hematuria, no hearing loss, no loss of consciousness and no tingling. The symptoms are aggravated by activity. Treatment on scene includes a c-collar. She has tried nothing for the symptoms. The treatment provided no relief.    Past Medical History  Diagnosis Date  . Chicken pox   . Hyperlipidemia   .  Depression   . Hypothyroid   . Bronchitis     hx of  . Anemia   . Blood transfusion     hx of  . Hepatitis     Hx of  . Urinary frequency   . Urinary tract infection     hx of  . Arthritis     "legs, neck"  . Ulcer, stomach peptic     hx of  . Heart murmur     patient's primary Dr. Cranston Neighbor    Past Surgical History  Procedure Date  . Back surgery     x3, fusion and rod  . Abdominal hysterectomy   . Knee arthroscopy 2007    right  . Abdominal surgery   . Tubal ligation   . Dilation and curettage of uterus   . Breast lumpectomy     right side  . Posterior cervical fusion/foraminotomy 11/13/2011    Procedure: POSTERIOR CERVICAL FUSION/FORAMINOTOMY LEVEL 1;  Surgeon: Carmela Hurt, MD;  Location: [facility];  Service: Neurosurgery;  Laterality: N/A;  Cervical one Cervical two posterior transpedicular fusion with lateral mass screws    Family History  Problem Relation Age of Onset  . Arthritis    . Hyperlipidemia    . Hypertension      History  Substance Use Topics  . Smoking status: Never Smoker   . Smokeless tobacco: Not on file  . Alcohol Use: No    OB History    Grav  Para Term Preterm Abortions TAB SAB Ect Mult Living                  Review of Systems  Constitutional: Negative for fever and chills.  HENT: Negative for neck pain.   Eyes: Negative for pain.  Respiratory: Negative for chest tightness and shortness of breath.   Cardiovascular: Negative for chest pain.  Gastrointestinal: Negative for nausea, vomiting, abdominal pain and bowel incontinence.  Genitourinary: Negative for hematuria.  Musculoskeletal: Positive for back pain, arthralgias and gait problem.  Neurological: Positive for headaches. Negative for tingling, loss of consciousness and numbness.  All other systems reviewed and are negative.    Allergies  Review of patient's allergies indicates no known allergies.  Home Medications   Current Outpatient Rx  Name Route Sig Dispense  Refill  . ALBUTEROL SULFATE HFA 108 (90 BASE) MCG/ACT IN AERS Inhalation Inhale 2 puffs into the lungs every 4 (four) hours as needed. For cough    . BENZONATATE 200 MG PO CAPS Oral Take 200 mg by mouth every 8 (eight) hours as needed. For cough    . CITALOPRAM HYDROBROMIDE 10 MG PO TABS Oral Take 10 mg by mouth daily.      . DONEPEZIL HCL 10 MG PO TABS Oral Take 10 mg by mouth daily.    Marland Kitchen GABAPENTIN 300 MG PO CAPS Oral Take 300 mg by mouth at bedtime.    Marland Kitchen HYDROCHLOROTHIAZIDE 12.5 MG PO CAPS Oral Take 12.5 mg by mouth daily.     Marland Kitchen HYDROCODONE-ACETAMINOPHEN 10-325 MG PO TABS Oral Take 1 tablet by mouth every 6 (six) hours as needed. For pain    . HYDROCORTISONE 2.5 % EX CREA Topical Apply topically 2 (two) times daily. For irritation    . LEVOTHYROXINE SODIUM 150 MCG PO TABS Oral Take 150 mcg by mouth daily.    Marland Kitchen LOPERAMIDE HCL 2 MG PO CAPS Oral Take 2 mg by mouth every 6 (six) hours as needed. For diarrhea    . LORAZEPAM 0.5 MG PO TABS Oral Take 1 tablet (0.5 mg total) by mouth 3 (three) times daily. 90 tablet 3  . METHOCARBAMOL 750 MG PO TABS Oral Take 750 mg by mouth at bedtime as needed.      . MOMETASONE FUROATE 50 MCG/ACT NA SUSP Nasal Place 2 sprays into the nose daily.    Marland Kitchen OMEPRAZOLE 20 MG PO CPDR Oral Take 20 mg by mouth daily.    Marland Kitchen POTASSIUM CHLORIDE CRYS ER 20 MEQ PO TBCR Oral Take 20 mEq by mouth daily.    Marland Kitchen ROSUVASTATIN CALCIUM 20 MG PO TABS Oral Take 20 mg by mouth daily.      Pulse 72  Temp 98.5 F (36.9 C) (Oral)  Resp 16  SpO2 94%  Physical Exam  Nursing note and vitals reviewed. Constitutional: She is oriented to person, place, and time. She appears well-developed and well-nourished. No distress.  HENT:  Head: Normocephalic and atraumatic.  Right Ear: External ear normal.  Left Ear: External ear normal.  Nose: Nose normal.  Mouth/Throat: Oropharynx is clear and moist. No oropharyngeal exudate.       Mild right parietal scalp ttp  Eyes: Conjunctivae are normal.  Pupils are equal, round, and reactive to light. No scleral icterus.  Neck: Neck supple.       c- spine immobilizer in philly collar  Cardiovascular: Normal rate, regular rhythm and normal heart sounds.  Exam reveals no gallop and no friction rub.   No murmur  heard. Pulmonary/Chest: Effort normal and breath sounds normal. No respiratory distress. She has no wheezes. She has no rales. She exhibits no tenderness.  Abdominal: Soft. Bowel sounds are normal. She exhibits no distension. There is no tenderness.  Musculoskeletal: Normal range of motion.       Right shoulder: She exhibits tenderness and bony tenderness. She exhibits normal range of motion, no deformity, normal pulse and normal strength.       Left hip: She exhibits tenderness and bony tenderness. She exhibits normal range of motion, normal strength and no deformity.       Lumbar back: She exhibits tenderness and bony tenderness. She exhibits normal range of motion.  Neurological: She is alert and oriented to person, place, and time. No cranial nerve deficit. She exhibits normal muscle tone. Coordination normal.  Skin: Skin is warm and dry. No rash noted. No erythema. No pallor.  Psychiatric: She has a normal mood and affect. Her behavior is normal. Judgment and thought content normal.    ED Course  Procedures (including critical care time)  Labs Reviewed - No data to display No results found.  Results for orders placed during the hospital encounter of 11/13/11  CBC      Component Value Range   WBC 9.0  4.0 - 10.5 K/uL   RBC 3.99  3.87 - 5.11 MIL/uL   Hemoglobin 12.5  12.0 - 15.0 g/dL   HCT 16.1  09.6 - 04.5 %   MCV 92.2  78.0 - 100.0 fL   MCH 31.3  26.0 - 34.0 pg   MCHC 34.0  30.0 - 36.0 g/dL   RDW 40.9  81.1 - 91.4 %   Platelets 206  150 - 400 K/uL  DIFFERENTIAL      Component Value Range   Neutrophils Relative 77  43 - 77 %   Neutro Abs 6.9  1.7 - 7.7 K/uL   Lymphocytes Relative 13  12 - 46 %   Lymphs Abs 1.2  0.7 -  4.0 K/uL   Monocytes Relative 10  3 - 12 %   Monocytes Absolute 0.9  0.1 - 1.0 K/uL   Eosinophils Relative 0  0 - 5 %   Eosinophils Absolute 0.0  0.0 - 0.7 K/uL   Basophils Relative 0  0 - 1 %   Basophils Absolute 0.0  0.0 - 0.1 K/uL  URINE CULTURE      Component Value Range   Specimen Description URINE, CATHETERIZED     Special Requests NONE     Culture  Setup Time [id number]     Colony Count NO GROWTH     Culture NO GROWTH     Report Status 11/19/2011 FINAL    URINALYSIS, ROUTINE W REFLEX MICROSCOPIC      Component Value Range   Color, Urine AMBER (*) YELLOW   APPearance CLEAR  CLEAR   Specific Gravity, Urine 1.023  1.005 - 1.030   pH 6.0  5.0 - 8.0   Glucose, UA NEGATIVE  NEGATIVE mg/dL   Hgb urine dipstick NEGATIVE  NEGATIVE   Bilirubin Urine SMALL (*) NEGATIVE   Ketones, ur 15 (*) NEGATIVE mg/dL   Protein, ur NEGATIVE  NEGATIVE mg/dL   Urobilinogen, UA 1.0  0.0 - 1.0 mg/dL   Nitrite NEGATIVE  NEGATIVE   Leukocytes, UA NEGATIVE  NEGATIVE   Dg Lumbar Spine Complete  01/06/2012  *RADIOLOGY REPORT*  Clinical Data: Larey Seat.  Back pain.  LUMBAR SPINE - COMPLETE 4+ VIEW  Comparison: None  Findings: Lumbar fusion hardware noted with pedicle screws and posterior rods extending from L3 S1.  No complicating features are demonstrated.  No definite acute fracture.  IMPRESSION:  1.  Lumbar fusion hardware without complicating features. 2.  No definite acute fractures.  Original Report Authenticated By: P. Loralie Champagne, M.D.   Dg Shoulder Right  01/06/2012  *RADIOLOGY REPORT*  Clinical Data: Larey Seat.  Right shoulder pain.  RIGHT SHOULDER - 2+ VIEW  Comparison: 09/10/2008.  Findings: There are stable degenerative changes involving the [facility] joint and glenohumeral joint.  No acute fracture.  The right lung apex is clear.  IMPRESSION: No acute bony findings.  Original Report Authenticated By: P. Loralie Champagne, M.D.   Dg Hip Complete Left  01/06/2012  *RADIOLOGY REPORT*  Clinical Data: Larey Seat.  Left  hip pain.  LEFT HIP - COMPLETE 2+ VIEW  Comparison: 09/10/2008.  Findings: Stable lumbar fusion hardware.  The hips are normally located.  No acute fracture.  The pubic symphysis and SI joints are intact.  No definite pelvic fractures.  IMPRESSION: No acute bony findings.  Original Report Authenticated By: P. Loralie Champagne, M.D.   Ct Head Wo Contrast  01/06/2012  *RADIOLOGY REPORT*  Clinical Data:  Fall.  Prior history of C1-2 fracture.  CT HEAD WITHOUT CONTRAST CT CERVICAL SPINE WITHOUT CONTRAST  Technique:  Multidetector CT imaging of the head and cervical spine was performed following the standard protocol without intravenous contrast.  Multiplanar CT image reconstructions of the cervical spine were also generated.  Comparison:  11/12/2011 CT head.  02/12/2011 cervical spine CT.  CT HEAD  Findings: There is atrophy and chronic small vessel disease changes. No acute intracranial abnormality.  Specifically, no hemorrhage, hydrocephalus, mass lesion, acute infarction, or significant intracranial injury.  No acute calvarial abnormality. Visualized paranasal sinuses and mastoids clear.  Orbital soft tissues unremarkable.  IMPRESSION: No acute intracranial abnormality.  Atrophy, chronic microvascular disease.  CT CERVICAL SPINE  Findings: There are postoperative changes from posterior fusion at C1-2.  Cerclage wires noted posteriorly around the posterior rings of C1.  Deformity at the C1-2 articulation, likely related to old injury and severe arthritis.  Diffuse degenerative disc disease throughout the cervical spine.   No acute fracture.  Prevertebral soft tissues are normal.  No epidural or paraspinal hematoma.  IMPRESSION: Postoperative changes at C1-2 from prior trauma.  Destructive changes also noted at this level which are likely related to severe arthritis and possibly prior trauma.  Diffuse degenerative disc disease.  No acute findings.  Original Report Authenticated By: Cyndie Chime, M.D.   Ct Cervical  Spine Wo Contrast  01/06/2012  *RADIOLOGY REPORT*  Clinical Data:  Fall.  Prior history of C1-2 fracture.  CT HEAD WITHOUT CONTRAST CT CERVICAL SPINE WITHOUT CONTRAST  Technique:  Multidetector CT imaging of the head and cervical spine was performed following the standard protocol without intravenous contrast.  Multiplanar CT image reconstructions of the cervical spine were also generated.  Comparison:  11/12/2011 CT head.  02/12/2011 cervical spine CT.  CT HEAD  Findings: There is atrophy and chronic small vessel disease changes. No acute intracranial abnormality.  Specifically, no hemorrhage, hydrocephalus, mass lesion, acute infarction, or significant intracranial injury.  No acute calvarial abnormality. Visualized paranasal sinuses and mastoids clear.  Orbital soft tissues unremarkable.  IMPRESSION: No acute intracranial abnormality.  Atrophy, chronic microvascular disease.  CT CERVICAL SPINE  Findings: There are postoperative changes from posterior fusion at C1-2.  Cerclage wires noted posteriorly around the posterior rings  of C1.  Deformity at the C1-2 articulation, likely related to old injury and severe arthritis.  Diffuse degenerative disc disease throughout the cervical spine.   No acute fracture.  Prevertebral soft tissues are normal.  No epidural or paraspinal hematoma.  IMPRESSION: Postoperative changes at C1-2 from prior trauma.  Destructive changes also noted at this level which are likely related to severe arthritis and possibly prior trauma.  Diffuse degenerative disc disease.  No acute findings.  Original Report Authenticated By: Cyndie Chime, M.D.     Fall   MDM  Patient here with fall from wheelchair without any significant noted injury.  She will be discharged home with her granddaughter who is in the room.        Izola Price [place], [place] 01/06/12 [phone]

## 2012-01-06 NOTE — ED Notes (Signed)
Pt states she was transferring back in her wheelchair when she slipped and fell, complaining of back/R shoulder/legs/and headache. Pt has C-collar on, which was on when she fell d/t recent surgery. Pt a/o x 4, in no distress.

## 2012-01-06 NOTE — ED Notes (Signed)
Per EMS pt was going from her wheelchair to walker to go to the bathroom, slipped from wheelchair and fell, head was under wheelchair, pt from [facility], pt complaining of back pain, recently had a cervical infusion x 1 month ago. Pt was already wearing a soft collar, moves all extremities. NO LOC. 110/88, 80, 20

## 2012-01-07 NOTE — ED Provider Notes (Signed)
Medical screening examination/treatment/procedure(s) were performed by non-physician practitioner and as supervising physician I was immediately available for consultation/collaboration.   Devarius Nelles R Nelvin Tomb, MD 01/07/12 0016 

## 2012-01-10 ENCOUNTER — Telehealth: Payer: Self-pay | Admitting: Family Medicine

## 2012-01-10 NOTE — Telephone Encounter (Signed)
Covering for her PCP. I don't know if a x-ray is needed or what type of x-ray is needed, were is she hurting?. Recommend urgent care or ER

## 2012-01-10 NOTE — Telephone Encounter (Signed)
Dr.Hopper please advise 

## 2012-01-10 NOTE — Telephone Encounter (Signed)
Left detailed msg on pt's vmail.  

## 2012-01-10 NOTE — Telephone Encounter (Signed)
Needs verbal order for x-ray patient fell, mobil unit there now Call tonya monroe back at [phone]

## 2012-01-15 ENCOUNTER — Telehealth: Payer: Self-pay | Admitting: *Deleted

## 2012-01-15 NOTE — Telephone Encounter (Signed)
Called [facility] to advise recent results for radiology report, MD Beverely Low advised that the pt DOES NOT have a fracture or acute injury on x-ray, pt does have severe osteopenia/osteoporosis and should start taking Fosamax 75mg  weekly if not already taking, Mallory Burke advised that she is not currently taking fosamax and that they will add this to her medication list, she also noted she did receive the copy of the x-ray results that this nurse faxed and will implement new medications/dx

## 2012-01-17 ENCOUNTER — Other Ambulatory Visit: Payer: Self-pay | Admitting: *Deleted

## 2012-01-17 NOTE — Telephone Encounter (Signed)
Called facility and advised Angelica that this medication was prescribed by the Neurosurgeon and they need to contact them to receive another refill, angelica voiced understanding and will call neuro

## 2012-01-17 NOTE — Telephone Encounter (Signed)
Call from [facility] requesting a hard copy for the hydrocodone. .Please advise

## 2012-01-17 NOTE — Telephone Encounter (Signed)
Neurosurg was prescribing this for pt after neck surgery

## 2012-01-23 ENCOUNTER — Other Ambulatory Visit: Payer: Self-pay | Admitting: Neurosurgery

## 2012-01-23 DIAGNOSIS — M542 Cervicalgia: Secondary | ICD-10-CM

## 2012-01-28 ENCOUNTER — Ambulatory Visit
Admission: RE | Admit: 2012-01-28 | Discharge: 2012-01-28 | Disposition: A | Discharge status: Home / Self Care | Payer: Medicare Other | Source: Ambulatory Visit | Attending: Neurosurgery | Admitting: Neurosurgery

## 2012-01-28 DIAGNOSIS — M542 Cervicalgia: Secondary | ICD-10-CM

## 2012-01-29 ENCOUNTER — Encounter (HOSPITAL_COMMUNITY): Payer: Self-pay | Admitting: *Deleted

## 2012-01-29 ENCOUNTER — Emergency Department (HOSPITAL_COMMUNITY): Payer: Medicare Other

## 2012-01-29 ENCOUNTER — Emergency Department (HOSPITAL_COMMUNITY)
Admission: EM | Admit: 2012-01-29 | Discharge: 2012-01-29 | Disposition: A | Discharge status: Home / Self Care | Payer: Medicare Other | Attending: Emergency Medicine | Admitting: Emergency Medicine

## 2012-01-29 DIAGNOSIS — F329 Major depressive disorder, single episode, unspecified: Secondary | ICD-10-CM | POA: Insufficient documentation

## 2012-01-29 DIAGNOSIS — M549 Dorsalgia, unspecified: Secondary | ICD-10-CM | POA: Insufficient documentation

## 2012-01-29 DIAGNOSIS — E039 Hypothyroidism, unspecified: Secondary | ICD-10-CM | POA: Insufficient documentation

## 2012-01-29 DIAGNOSIS — Z8739 Personal history of other diseases of the musculoskeletal system and connective tissue: Secondary | ICD-10-CM | POA: Insufficient documentation

## 2012-01-29 DIAGNOSIS — W07XXXA Fall from chair, initial encounter: Secondary | ICD-10-CM | POA: Insufficient documentation

## 2012-01-29 DIAGNOSIS — F3289 Other specified depressive episodes: Secondary | ICD-10-CM | POA: Insufficient documentation

## 2012-01-29 DIAGNOSIS — E785 Hyperlipidemia, unspecified: Secondary | ICD-10-CM | POA: Insufficient documentation

## 2012-01-29 DIAGNOSIS — M542 Cervicalgia: Secondary | ICD-10-CM | POA: Insufficient documentation

## 2012-01-29 DIAGNOSIS — R51 Headache: Secondary | ICD-10-CM | POA: Insufficient documentation

## 2012-01-29 DIAGNOSIS — G319 Degenerative disease of nervous system, unspecified: Secondary | ICD-10-CM | POA: Insufficient documentation

## 2012-01-29 DIAGNOSIS — W19XXXA Unspecified fall, initial encounter: Secondary | ICD-10-CM

## 2012-01-29 DIAGNOSIS — Z79899 Other long term (current) drug therapy: Secondary | ICD-10-CM | POA: Insufficient documentation

## 2012-01-29 MED ORDER — HYDROCODONE-ACETAMINOPHEN 5-325 MG PO TABS
1.0000 | ORAL_TABLET | Freq: Once | ORAL | Status: AC
  Administered 2012-01-29: 1 via ORAL
  Filled 2012-01-29: qty 1

## 2012-01-29 MED ORDER — ACETAMINOPHEN 325 MG PO TABS
650.0000 mg | ORAL_TABLET | Freq: Once | ORAL | Status: AC
  Administered 2012-01-29: 650 mg via ORAL
  Filled 2012-01-29: qty 2

## 2012-01-29 NOTE — ED Provider Notes (Signed)
History     CSN: [id number]  Arrival date & time 01/29/12  1639   First MD Initiated Contact with Patient 01/29/12 1721      Chief Complaint  Patient presents with  . Fall    fell out of [facility] at [facility]   . Back Pain  . Neck Pain    (Consider location/radiation/quality/duration/timing/severity/associated sxs/prior treatment) HPI The patient presents from her nursing facility after a fall.  She notes that she slipped off of her chair, striking her head and neck.  She denies loss of consciousness, subsequent nausea, vomiting, visual changes, cognition changes.  Chest since that time she said pain persistently in her head and neck.  She has chronic neck pain, but notes that her pain is acutely worse, worse with any motion.  No attempts at relief with medication thus far.  Her headache is diffuse, sore. The patient states that she had been in her usual health, prior to the fall.  This includes being evaluated for osteoporosis with radiographic studies that were done one week ago.  Notably, the patient has a history of frequent falls. Past Medical History  Diagnosis Date  . Chicken pox   . Hyperlipidemia   . Depression   . Hypothyroid   . Bronchitis     hx of  . Anemia   . Blood transfusion     hx of  . Hepatitis     Hx of  . Urinary frequency   . Urinary tract infection     hx of  . Arthritis     "legs, neck"  . Ulcer, stomach peptic     hx of  . Heart murmur     patient's primary Dr. Cranston Neighbor    Past Surgical History  Procedure Date  . Back surgery     x3, fusion and rod  . Abdominal hysterectomy   . Knee arthroscopy 2007    right  . Abdominal surgery   . Tubal ligation   . Dilation and curettage of uterus   . Breast lumpectomy     right side  . Posterior cervical fusion/foraminotomy 11/13/2011    Procedure: POSTERIOR CERVICAL FUSION/FORAMINOTOMY LEVEL 1;  Surgeon: Carmela Hurt, MD;  Location: [facility];  Service: Neurosurgery;  Laterality: N/A;   Cervical one Cervical two posterior transpedicular fusion with lateral mass screws    Family History  Problem Relation Age of Onset  . Arthritis    . Hyperlipidemia    . Hypertension      History  Substance Use Topics  . Smoking status: Never Smoker   . Smokeless tobacco: Not on file  . Alcohol Use: No    OB History    Grav Para Term Preterm Abortions TAB SAB Ect Mult Living                  Review of Systems  Constitutional:       HPI  HENT:       HPI otherwise negative  Eyes: Negative.   Respiratory:       HPI, otherwise negative  Cardiovascular:       HPI, otherwise nmegative  Gastrointestinal: Negative for vomiting.  Genitourinary:       HPI, otherwise negative  Musculoskeletal:       HPI, otherwise negative  Skin: Negative.   Neurological: Negative for syncope.    Allergies  Review of patient's allergies indicates no known allergies.  Home Medications   Current Outpatient Rx  Name Route  Sig Dispense Refill  . ALBUTEROL SULFATE HFA 108 (90 BASE) MCG/ACT IN AERS Inhalation Inhale 2 puffs into the lungs every 4 (four) hours as needed. For cough    . BENZONATATE 200 MG PO CAPS Oral Take 200 mg by mouth every 6 (six) hours as needed. For cough    . CITALOPRAM HYDROBROMIDE 10 MG PO TABS Oral Take 10 mg by mouth daily.      . DONEPEZIL HCL 10 MG PO TABS Oral Take 10 mg by mouth daily.    Marland Kitchen GABAPENTIN 300 MG PO CAPS Oral Take 300 mg by mouth at bedtime.    Marland Kitchen HYDROCHLOROTHIAZIDE 12.5 MG PO CAPS Oral Take 12.5 mg by mouth daily.     Marland Kitchen HYDROCODONE-ACETAMINOPHEN 10-325 MG PO TABS Oral Take 1 tablet by mouth every 4 (four) hours as needed. For pain    . LEVOTHYROXINE SODIUM 150 MCG PO TABS Oral Take 150 mcg by mouth daily.    Marland Kitchen LORAZEPAM 0.5 MG PO TABS Oral Take 1 tablet (0.5 mg total) by mouth 3 (three) times daily. 90 tablet 3  . MOMETASONE FUROATE 50 MCG/ACT NA SUSP Nasal Place 2 sprays into the nose daily.    Marland Kitchen OMEPRAZOLE 20 MG PO CPDR Oral Take 20 mg by mouth  daily.    Marland Kitchen POTASSIUM CHLORIDE CRYS ER 20 MEQ PO TBCR Oral Take 20 mEq by mouth daily.    Marland Kitchen ROSUVASTATIN CALCIUM 20 MG PO TABS Oral Take 20 mg by mouth daily.    Marland Kitchen HYDROCORTISONE 2.5 % EX CREA Topical Apply topically 2 (two) times daily. For irritation    . LOPERAMIDE HCL 2 MG PO CAPS Oral Take 2 mg by mouth every 6 (six) hours as needed. For diarrhea    . METHOCARBAMOL 750 MG PO TABS Oral Take 750 mg by mouth at bedtime as needed. Muscle spasms.      BP 160/98  Pulse 76  Resp 18  SpO2 98%  Physical Exam  Nursing note and vitals reviewed. Constitutional: She is oriented to person, place, and time. She appears well-developed and well-nourished. No distress. Cervical collar in place.  HENT:  Head: Normocephalic and atraumatic. Head is without raccoon's eyes, without Battle's sign, without abrasion, without laceration, without right periorbital erythema and without left periorbital erythema. Hair is normal.    Eyes: Conjunctivae and EOM are normal. Pupils are equal, round, and reactive to light.  Neck: Spinous process tenderness and muscular tenderness present. No rigidity. Decreased range of motion present. No edema and no erythema present.  Cardiovascular: Normal rate and regular rhythm.   Pulmonary/Chest: Effort normal and breath sounds normal. No stridor. No respiratory distress.  Abdominal: She exhibits no distension.  Musculoskeletal: She exhibits no edema.  Neurological: She is alert and oriented to person, place, and time. No cranial nerve deficit.       The patient has appropriate strength in all 4 extremities, no facial asymmetry, speech deficiency.  Skin: Skin is warm and dry.  Psychiatric: She has a normal mood and affect.    ED Course  Procedures (including critical care time)  Labs Reviewed - No data to display Ct Head Wo Contrast  01/29/2012  *RADIOLOGY REPORT*  Clinical Data: Back pain and neck pain after a fall.  CT HEAD WITHOUT CONTRAST  Technique:  Contiguous  axial images were obtained from the base of the skull through the vertex without contrast.  Comparison: 01/06/2012  Findings: There is no acute intracranial hemorrhage, infarction, or mass lesion.  There  is diffuse cerebral cortical atrophy with secondary ventricular dilatation is well as chronic periventricular white matter lucency consistent with small vessel ischemic disease. Old tiny infarct in the left basal ganglia, unchanged.  No acute osseous abnormality.  Previous surgery at the skull base and C1.  IMPRESSION: No acute intracranial abnormality.  Original Report Authenticated By: Gwynn Burly, M.D.   Ct Cervical Spine Wo Contrast  01/29/2012  *RADIOLOGY REPORT*  Clinical Data: Neck pain secondary to a fall.  CT CERVICAL SPINE WITHOUT CONTRAST  Technique:  Multidetector CT imaging of the cervical spine was performed. Multiplanar CT image reconstructions were also generated.  Comparison: CT scan dated 01/28/2012  Findings: There is no acute fracture or subluxation.  The patient has old fractures of C1 and C2 with prior internal fixation.  The appearance at C1-2 is unchanged since the prior study. There is degenerative or post-traumatic arthritis at the articulation of the right lateral mass of C1 with C2.  There is diffuse degenerative disc disease throughout the cervical spine with anterior posterior osteophytes.  There is multilevel facet arthritis on the left particularly at  C3-4, C4-5 and C5-6.  IMPRESSION: No acute abnormality.  Old fractures at C1-2, unchanged.  Original Report Authenticated By: Gwynn Burly, M.D.   Ct Cervical Spine Wo Contrast  01/28/2012  *RADIOLOGY REPORT*  Clinical Data: Arthritis.  Right-sided head pain.  Neck pain. Inability to walk.  C1-C2 spinal fixation.  CT CERVICAL SPINE WITHOUT CONTRAST  Technique:  Multidetector CT imaging of the cervical spine was performed. Multiplanar CT image reconstructions were also generated.  Comparison: 11/12/2011.  01/06/2012.   Findings: Straightening of the normal cervical lordosis.  Unchanged cerclage wires around the posterior elements of C1 and C2.  Rod and screw fixation of the lateral masses of C1-C2 appear unchanged compared to prior.  There is no arthrodesis across C1-C2 at this time.  Stable asymmetry of the atlantodental space.  Severe degenerative changes of the right C1-C2 articulation are again noted, without interval change from recent prior 01/17/2012.  Multilevel degenerative changes at other levels appears similar. There is no cervical spine fracture or dislocation.  [place]- occipital alignment appears within normal limits. On the sagittal images, a bone graft is present posterior to the arches of C1 and C2, retained by cerclage wires.  IMPRESSION: Postoperative changes of C1-C2 fixation with posterior rod and screw fixation on the left and posterior element cerclage wires. There is no fusion at this time. Multilevel degenerative changes at other levels appears similar.  No change in spinal alignment.  Original Report Authenticated By: Andreas Newport, M.D.     No diagnosis found.  Cardiac 75 sinus rhythm normal Pulse ox 98% room air normal   MDM  This elderly female with frequent falls, seemingly chronic instability, now presents following a mechanical fall.  The patient's capacity of between the turbinate is somewhat reassuring, but her section of new, severely worsening in her head and neck is concerning.  The patient's evaluation here was reassuring, with no demonstration of acute fractures, though she has notable degenerative changes and prior surgical intervention evidence.  The patient was discharged in stable condition with instructions to maximize fall precautions     Gerhard Munch, MD 01/29/12 1818

## 2012-01-29 NOTE — ED Notes (Signed)
[facility] notified.  ?

## 2012-01-29 NOTE — ED Notes (Signed)
unwitnessed fall at facility, [facility]; stated patient had slid under bed; pt complaining of neck and back pain; recent neck surgery with cervical collar in place; pt reported to have some confusion, which is normal for patient

## 2012-01-31 ENCOUNTER — Encounter: Payer: Self-pay | Admitting: Family Medicine

## 2012-02-05 ENCOUNTER — Telehealth: Payer: Self-pay | Admitting: *Deleted

## 2012-02-05 ENCOUNTER — Ambulatory Visit: Payer: Medicare Other | Admitting: Family Medicine

## 2012-02-05 NOTE — Telephone Encounter (Signed)
Received call from [facility] from [facility] whom advised pt transportation did not show up to take her to the OV scheduled for today at 2:15pm, noted time of call as 2:35pm, advised this apt was for ED follow up per recent fall due to neck pain, advised that she has an upcoming follow up apt scheduled for 02-13-12, also notes pt returned to facility same day of ED visit and currently is feeling fine, advised per MD Beverely Low verbal that pt can follow up with her surgeon on 02-13-12 and does not need to re-schedule with MD Tabori.

## 2012-02-07 ENCOUNTER — Telehealth: Payer: Self-pay | Admitting: *Deleted

## 2012-02-07 NOTE — Telephone Encounter (Signed)
[id number] vm from [facility] with [facility] to advise pt is regressing with her PT, notes not able to transfer any longer with gait training, notes now is a two person assist, pt also refusing to use her walker to walk, also noting pt appetite has decreased, Mallory Burke stated she is reporting this to advise they will try to give pt Physical Therapy again next week and update with MD Beverely Low as needed, also noted incoming fax from pt facility [facility] requesting an order for [facility]ift due to pt inability to stand, also to request for an order for a U/A due to pt frequently asking to urinate, MD Lowne signed fax to ok order for U/A and hoyer lift, faxed to facility.

## 2012-02-10 NOTE — Telephone Encounter (Signed)
Noted.  If pt is requiring this level of care may need to move from assisted living facility to full nursing services

## 2012-02-11 ENCOUNTER — Encounter: Payer: Self-pay | Admitting: Family Medicine

## 2012-02-13 NOTE — Telephone Encounter (Signed)
Will await update from [place] nurse if further assistance is needed.

## 2012-02-17 ENCOUNTER — Telehealth: Payer: Self-pay | Admitting: *Deleted

## 2012-02-17 NOTE — Telephone Encounter (Signed)
Received incoming call from Hilda Lias with [facility] [phone] to request an extension of PT for 2 times a week for 3 more weeks, verbal order given per MD Beverely Low to extend, Hilda Lias advised that she has noticed pt is progressing but on a lower level as expected, advised marie per MD Beverely Low notation that if the pt continues to regress she may need to be relocated to a skilled nursing facility, Hilda Lias agreed and advised that she will inform the nursing staff of MD Beverely Low suggestion, MD Beverely Low made aware verbally

## 2012-03-06 ENCOUNTER — Telehealth: Payer: Self-pay | Admitting: *Deleted

## 2012-03-06 NOTE — Telephone Encounter (Signed)
Received call to request pt extension for PT, notes progress, also to request a RX for a hospital bed per pt unable to transfer in current bed per notes too soft, please advise,  fax to [phone]

## 2012-03-06 NOTE — Telephone Encounter (Signed)
Called [name] to advise that MD Franky Macho stated he did not want to continue PT per originally ordered by MD Beverely Low,  MD Beverely Low started the PT for the pt as a courtesy per the pt current facility at the time [facility] called office upset per the pt was not set up for Physical Therapy and needed this, advised [name] that MD Beverely Low has not seen pt since November and per the pt need for continued care based on the surgery that MD Cabbell performed he needs to be the one to continue PT and set up for the hospital bed. [name] noted she will contact MD Cabbell and advise, MD Tabori aware verbally

## 2012-03-06 NOTE — Telephone Encounter (Signed)
I have not been ordering pt's home health b/c she has not been seen at this office since November.  Neurosurg has been ordering her PT and will likely need to order the hospital bed.  (i'm unable to fill out a face to face encounter)

## 2012-03-14 ENCOUNTER — Emergency Department (HOSPITAL_COMMUNITY): Payer: Medicare Other

## 2012-03-14 ENCOUNTER — Emergency Department (HOSPITAL_COMMUNITY)
Admission: EM | Admit: 2012-03-14 | Discharge: 2012-03-14 | Disposition: A | Discharge status: Skilled Nursing Facility | Payer: Medicare Other | Attending: Emergency Medicine | Admitting: Emergency Medicine

## 2012-03-14 DIAGNOSIS — S139XXA Sprain of joints and ligaments of unspecified parts of neck, initial encounter: Secondary | ICD-10-CM | POA: Insufficient documentation

## 2012-03-14 DIAGNOSIS — M25569 Pain in unspecified knee: Secondary | ICD-10-CM | POA: Insufficient documentation

## 2012-03-14 DIAGNOSIS — W06XXXA Fall from bed, initial encounter: Secondary | ICD-10-CM | POA: Insufficient documentation

## 2012-03-14 DIAGNOSIS — M1990 Unspecified osteoarthritis, unspecified site: Secondary | ICD-10-CM

## 2012-03-14 DIAGNOSIS — S01309A Unspecified open wound of unspecified ear, initial encounter: Secondary | ICD-10-CM | POA: Insufficient documentation

## 2012-03-14 DIAGNOSIS — S01319A Laceration without foreign body of unspecified ear, initial encounter: Secondary | ICD-10-CM

## 2012-03-14 DIAGNOSIS — W19XXXA Unspecified fall, initial encounter: Secondary | ICD-10-CM

## 2012-03-14 DIAGNOSIS — E039 Hypothyroidism, unspecified: Secondary | ICD-10-CM | POA: Insufficient documentation

## 2012-03-14 DIAGNOSIS — M545 Low back pain, unspecified: Secondary | ICD-10-CM | POA: Insufficient documentation

## 2012-03-14 DIAGNOSIS — G319 Degenerative disease of nervous system, unspecified: Secondary | ICD-10-CM | POA: Insufficient documentation

## 2012-03-14 DIAGNOSIS — M5030 Other cervical disc degeneration, unspecified cervical region: Secondary | ICD-10-CM | POA: Insufficient documentation

## 2012-03-14 DIAGNOSIS — M25559 Pain in unspecified hip: Secondary | ICD-10-CM | POA: Insufficient documentation

## 2012-03-14 DIAGNOSIS — S161XXA Strain of muscle, fascia and tendon at neck level, initial encounter: Secondary | ICD-10-CM

## 2012-03-14 MED ORDER — HYDROCODONE-ACETAMINOPHEN 10-325 MG PO TABS: 1.0000 | ORAL_TABLET | ORAL | Status: DC | PRN

## 2012-03-14 MED ORDER — SODIUM CHLORIDE 0.9 % IV SOLN
INTRAVENOUS | Status: DC
  Administered 2012-03-14: 17:00:00 via INTRAVENOUS

## 2012-03-14 MED ORDER — MORPHINE SULFATE 2 MG/ML IJ SOLN
2.0000 mg | INTRAMUSCULAR | Status: DC | PRN
  Administered 2012-03-14: 2 mg via INTRAVENOUS
  Filled 2012-03-14: qty 1

## 2012-03-14 NOTE — ED Notes (Signed)
[facility] called.  Report called to facility.

## 2012-03-14 NOTE — ED Provider Notes (Addendum)
History     CSN: [id number]  Arrival date & time 03/14/12  1448   First MD Initiated Contact with Patient 03/14/12 1507      Chief Complaint  Patient presents with  . Fall    HPI The patient presents to the emergency room with complaints of neck pain at her falling out of her bed. Patient was trying to reach for the phone when she lost her balance and fell. Patient also struck her head and cut her ear on the right side of the bed table. Patient states she's also having pain in her hips and her back. She also has pain in her neck but this is somewhat chronic and she has had neck pain since surgery 3 months ago. Patient denies any chest pain or shortness of breath. She denies any abdominal pain. The pain is moderate to severe and increases with movement. Past Medical History  Diagnosis Date  . Chicken pox   . Hyperlipidemia   . Depression   . Hypothyroid   . Bronchitis     hx of  . Anemia   . Blood transfusion     hx of  . Hepatitis     Hx of  . Urinary frequency   . Urinary tract infection     hx of  . Arthritis     "legs, neck"  . Ulcer, stomach peptic     hx of  . Heart murmur     patient's primary Dr. Cranston Neighbor    Past Surgical History  Procedure Date  . Back surgery     x3, fusion and rod  . Abdominal hysterectomy   . Knee arthroscopy 2007    right  . Abdominal surgery   . Tubal ligation   . Dilation and curettage of uterus   . Breast lumpectomy     right side  . Posterior cervical fusion/foraminotomy 11/13/2011    Procedure: POSTERIOR CERVICAL FUSION/FORAMINOTOMY LEVEL 1;  Surgeon: Carmela Hurt, MD;  Location: [facility];  Service: Neurosurgery;  Laterality: N/A;  Cervical one Cervical two posterior transpedicular fusion with lateral mass screws    Family History  Problem Relation Age of Onset  . Arthritis    . Hyperlipidemia    . Hypertension      History  Substance Use Topics  . Smoking status: Never Smoker   . Smokeless tobacco: Not on file  .  Alcohol Use: No    OB History    Grav Para Term Preterm Abortions TAB SAB Ect Mult Living                  Review of Systems  All other systems reviewed and are negative.    Allergies  Review of patient's allergies indicates no known allergies.  Home Medications   Current Outpatient Rx  Name Route Sig Dispense Refill  . ALBUTEROL SULFATE HFA 108 (90 BASE) MCG/ACT IN AERS Inhalation Inhale 2 puffs into the lungs every 4 (four) hours as needed. For cough    . BENZONATATE 200 MG PO CAPS Oral Take 200 mg by mouth every 6 (six) hours as needed. For cough    . CITALOPRAM HYDROBROMIDE 10 MG PO TABS Oral Take 10 mg by mouth every morning.     . DONEPEZIL HCL 10 MG PO TABS Oral Take 10 mg by mouth at bedtime.     Marland Kitchen GABAPENTIN 300 MG PO CAPS Oral Take 300 mg by mouth at bedtime.    Marland Kitchen HYDROCHLOROTHIAZIDE 12.5  MG PO CAPS Oral Take 12.5 mg by mouth every morning.     Marland Kitchen HYDROCODONE-ACETAMINOPHEN 10-325 MG PO TABS Oral Take 1 tablet by mouth every 4 (four) hours as needed. For pain    . HYDROCORTISONE 2.5 % EX CREA Topical Apply topically 2 (two) times daily. For irritation    . LEVOTHYROXINE SODIUM 150 MCG PO TABS Oral Take 150 mcg by mouth daily before breakfast.     . LOPERAMIDE HCL 2 MG PO CAPS Oral Take 2 mg by mouth every 6 (six) hours as needed. For diarrhea    . LORAZEPAM 0.5 MG PO TABS Oral Take 1 tablet (0.5 mg total) by mouth 3 (three) times daily. 90 tablet 3  . METHOCARBAMOL 750 MG PO TABS Oral Take 750 mg by mouth at bedtime as needed. Muscle spasms.    . MOMETASONE FUROATE 50 MCG/ACT NA SUSP Nasal Place 2 sprays into the nose daily.    Marland Kitchen OMEPRAZOLE 20 MG PO CPDR Oral Take 20 mg by mouth daily.    Marland Kitchen POTASSIUM CHLORIDE CRYS ER 20 MEQ PO TBCR Oral Take 20 mEq by mouth daily with breakfast. Do not  Crush. Take with food.    Marland Kitchen ROSUVASTATIN CALCIUM 20 MG PO TABS Oral Take 20 mg by mouth daily.      BP 123/79  Pulse 58  Temp 98.8 F (37.1 C) (Oral)  Resp 18  SpO2 96%  Physical  Exam  Nursing note and vitals reviewed. Constitutional: No distress.  HENT:  Head: Normocephalic and atraumatic.  Left Ear: External ear normal.       Small superficial lacerations right external ear  Eyes: Conjunctivae are normal. Right eye exhibits no discharge. Left eye exhibits no discharge. No scleral icterus.  Neck: Neck supple. No tracheal deviation present.  Cardiovascular: Normal rate, regular rhythm and intact distal pulses.   Pulmonary/Chest: Effort normal and breath sounds normal. No stridor. No respiratory distress. She has no wheezes. She has no rales.  Abdominal: Soft. Bowel sounds are normal. She exhibits no distension. There is no tenderness. There is no rebound and no guarding.  Musculoskeletal: She exhibits no edema and no tenderness.       Right shoulder: Normal.       Left shoulder: Normal.       Right hip: She exhibits tenderness. Decreased range of motion: pain with range of motion.       Left hip: Normal.       Right knee: She exhibits no effusion. tenderness found.       Cervical back: She exhibits tenderness.       Lumbar back: She exhibits tenderness.  Neurological: She is alert. She has normal strength. No sensory deficit. Cranial nerve deficit:  no gross defecits noted. She exhibits normal muscle tone. She displays no seizure activity. Coordination normal.  Skin: Skin is warm and dry. No rash noted. She is not diaphoretic.  Psychiatric: She has a normal mood and affect.    ED Course  Procedures (including critical care time) LACERATION REPAIR Performed by: WUJWJ,XBJ R Consent: Verbal consent obtained. Risks and benefits: risks, benefits and alternatives were discussed Patient identity confirmed: provided demographic data Time out performed prior to procedure Prepped and Draped in normal sterile fashion Wound explored Laceration Location: right ear Laceration Length:0.5cm No Foreign Bodies seen or palpated Anesthesia: local infiltration Local  anesthetic: None  Anesthetic total: na Irrigation method:  Amount of cleaning: standard Skin closure: Dermabond  Patient tolerance: Patient tolerated the procedure well  with no immediate complications.  Labs Reviewed - No data to display Dg Lumbar Spine Complete  03/14/2012  *RADIOLOGY REPORT*  Clinical Data: Low back pain.  Previous back surgery.  LUMBAR SPINE - COMPLETE 4+ VIEW  Comparison: Lumbar spine radiographs 01/06/2012  Findings: Posterior lumbar fusion hardware with pedicle screws and posterior rod spanning L3-S1 appears stable.  The bones are markedly osteopenic, and at the level of the fusion are difficult to visualize in the lateral projection.  IMPRESSION:  Marked osteopenia.  No acute bony abnormality visualized.  Stable posterior lumbar fusion hardware.   Original Report Authenticated By: Britta Mccreedy, M.D.    Dg Hip Complete Right  03/14/2012  *RADIOLOGY REPORT*  Clinical Data: Larey Seat.  Right hip pain.  RIGHT HIP - COMPLETE 2+ VIEW  Comparison: 01/17/2012.  Findings: The hips are normally located.  No definite hip fracture and no plain film evidence of avascular necrosis.  The pubic symphysis and SI joints are intact.  No definite acute pelvic fracture.  Lumbar fusion hardware is noted.  IMPRESSION: No acute bony findings.   Original Report Authenticated By: P. Loralie Champagne, M.D.    Ct Head Wo Contrast  03/14/2012  *RADIOLOGY REPORT*  Clinical Data:  Patient fell.  Cut year on side of table.  CT HEAD WITHOUT CONTRAST CT CERVICAL SPINE WITHOUT CONTRAST  Technique:  Multidetector CT imaging of the head and cervical spine was performed following the standard protocol without intravenous contrast.  Multiplanar CT image reconstructions of the cervical spine were also generated.  Comparison:  CT head and CT cervical spine 01/29/2012  CT HEAD  Findings: Cerebral atrophy and chronic microvascular ischemic changes are stable.  Ventricular size is stable appears commensurate with the degree of  atrophy.  Negative for hemorrhage, mass effect, or evidence of acute cortically based infarction.  Stable postsurgical scarring in the scalp overlying the posterior occiput. The visualized paranasal sinuses, mastoid air cells, and middle ears are clear.  The skull is intact.  IMPRESSION: No acute intracranial abnormality.  Chronic microvascular ischemic changes and cerebral atrophy.  CT CERVICAL SPINE  Findings: The cervical spine vertebral bodies are normally aligned from the skull base through the superior endplate of T3.  Disc height narrowing and osteophyte formation is seen at all levels of the cervical spine, and is stable.  There are stable postoperative changes of the C1-C2 articulation, with left sided posterior element screws and cerclage wires seen in the posterior midline at C1-C2.  These postsurgical changes appear stable compared to the cervical spine CT of 01/29/2012.  The prevertebral soft tissue contour is within normal limits.  No evidence of acute cervical spine fracture.  No evidence of epidural hematoma.  Lung apices are clear.  IMPRESSION:  1.  No evidence of acute bony injury to the cervical spine. 2.  Stable postsurgical changes at the C1-C2 articulation. 3.  Multilevel degenerative changes of the cervical spine.   Original Report Authenticated By: Britta Mccreedy, M.D.    Ct Cervical Spine Wo Contrast  03/14/2012  *RADIOLOGY REPORT*  Clinical Data:  Patient fell.  Cut year on side of table.  CT HEAD WITHOUT CONTRAST CT CERVICAL SPINE WITHOUT CONTRAST  Technique:  Multidetector CT imaging of the head and cervical spine was performed following the standard protocol without intravenous contrast.  Multiplanar CT image reconstructions of the cervical spine were also generated.  Comparison:  CT head and CT cervical spine 01/29/2012  CT HEAD  Findings: Cerebral atrophy and chronic microvascular ischemic changes are  stable.  Ventricular size is stable appears commensurate with the degree of atrophy.   Negative for hemorrhage, mass effect, or evidence of acute cortically based infarction.  Stable postsurgical scarring in the scalp overlying the posterior occiput. The visualized paranasal sinuses, mastoid air cells, and middle ears are clear.  The skull is intact.  IMPRESSION: No acute intracranial abnormality.  Chronic microvascular ischemic changes and cerebral atrophy.  CT CERVICAL SPINE  Findings: The cervical spine vertebral bodies are normally aligned from the skull base through the superior endplate of T3.  Disc height narrowing and osteophyte formation is seen at all levels of the cervical spine, and is stable.  There are stable postoperative changes of the C1-C2 articulation, with left sided posterior element screws and cerclage wires seen in the posterior midline at C1-C2.  These postsurgical changes appear stable compared to the cervical spine CT of 01/29/2012.  The prevertebral soft tissue contour is within normal limits.  No evidence of acute cervical spine fracture.  No evidence of epidural hematoma.  Lung apices are clear.  IMPRESSION:  1.  No evidence of acute bony injury to the cervical spine. 2.  Stable postsurgical changes at the C1-C2 articulation. 3.  Multilevel degenerative changes of the cervical spine.   Original Report Authenticated By: Britta Mccreedy, M.D.    Dg Knee Complete 4 Views Right  03/14/2012  *RADIOLOGY REPORT*  Clinical Data: Larey Seat.  Right knee pain.  RIGHT KNEE - COMPLETE 4+ VIEW  Comparison: 02/24/2011.  Findings: Severe tricompartmental degenerative changes are progressive since prior study.  No definite acute fracture.  No definite joint effusion.  IMPRESSION:  1.  Severe tricompartmental degenerative changes, progressive since 2012. 2.  No acute fracture or joint effusion   Original Report Authenticated By: P. Loralie Champagne, M.D.      MDM  Patient fell off her bed his morning which she has done in the past. She has no evidence of any serious injury. She does have  chronic arthritis problems and has pain associated with that. Patient's minor ear lacerations were repaired. She'll be discharged home. I'll have her continue her hydrocodone pain medication        Celene Kras, MD 03/14/12 1651  Celene Kras, MD 03/14/12 [phone]
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
## 2012-03-26 ENCOUNTER — Ambulatory Visit: Payer: Medicare Other | Admitting: Family Medicine

## 2012-03-26 DIAGNOSIS — Z0289 Encounter for other administrative examinations: Secondary | ICD-10-CM

## 2012-04-13 ENCOUNTER — Other Ambulatory Visit: Payer: Self-pay | Admitting: Family Medicine

## 2012-04-13 MED ORDER — HYDROCODONE-ACETAMINOPHEN 10-325 MG PO TABS: 1.0000 | ORAL_TABLET | ORAL | Status: DC | PRN

## 2012-04-13 NOTE — Telephone Encounter (Signed)
per fax--Resident needs hardscript for HydroCodo Contact name Alethea ph# [phone], fax [phone]

## 2012-04-13 NOTE — Telephone Encounter (Signed)
Ok for #30, 1 refill 

## 2012-04-13 NOTE — Telephone Encounter (Signed)
Last OV 05-27-11 last refill 03-14-12 #30 no refills

## 2012-04-13 NOTE — Telephone Encounter (Signed)
.  rx faxed to pharmacy, manually.  

## 2012-04-13 NOTE — Telephone Encounter (Signed)
Please check and see if neurosurg is also giving her pain meds.  i don't want to be duplicating her tx

## 2012-04-13 NOTE — Telephone Encounter (Signed)
No evidence of another MD in system giving pt medication

## 2012-04-22 ENCOUNTER — Telehealth: Payer: Self-pay | Admitting: *Deleted

## 2012-04-22 NOTE — Telephone Encounter (Signed)
Received incoming call from [facility] with [facility] to request MD Tabori's signature on the plan of care that MD Tabori scheduled for the pt, advised that MD Tabori had declined to sign this before per the home care was supposed to be signed for by the pt current specialist MD Cabbell per he has treated the pt and administered treatment that the pt needed to have PT for, however MD Cabbell had refused several times to set up PT for the pt per was not set up for the pt at the discharge from hospital thus MD Tabori placed a courtesy order for PT services to assist the pt and has since this time scheduled pt to be seen 03-26-12 to have a follow up apt per pt has not been seen by MD Tabori in over a year on 05-27-11, however pt NO SHOWED this apt and thus MD Beverely Low can not sign the home care order for this pt however Becky advised that MD Beverely Low was the original ordering physician and would need to sign the plan of care per intiated , advised that she could fax the paperwork for MD Tabori to look over and that this nurse could advise MD Tabori about the incoming paperwork and if able to sign we will fax back, becky understood, best contact [phone]

## 2012-04-23 ENCOUNTER — Other Ambulatory Visit: Payer: Self-pay | Admitting: Family Medicine

## 2012-04-23 MED ORDER — LORAZEPAM 0.5 MG PO TABS: 0.5000 mg | ORAL_TABLET | Freq: Three times a day (TID) | ORAL | Status: DC

## 2012-04-23 NOTE — Telephone Encounter (Signed)
REFILL Lorazepam 0.5mg  tablet -- Take 1 tab by mouth three times daily --last wrt 6.21.13 #90 wt/3-refills---last ov 11.19.12---No Future appts scheduled

## 2012-04-23 NOTE — Telephone Encounter (Signed)
.  rx faxed to pharmacy, manually for 90 no refills Called pt grandaughter tina to advise pt is overdue for a follow up apt per had apt set up on 03-26-12 and pt was a no show, pt grandaughter advised she will call pt facility and work with their transportation and apt dept to set up apt for pt asap.

## 2012-04-23 NOTE — Telephone Encounter (Signed)
Ok for #90, needs appt

## 2012-04-28 ENCOUNTER — Other Ambulatory Visit: Payer: Self-pay

## 2012-04-28 NOTE — Telephone Encounter (Signed)
I called the retirement home back to advise them whoever called the refill request in for pt we won't approve it because Rx was recently filled to early to fill again. Staff member that answered the phone stated understanding.        MW

## 2012-04-28 NOTE — Telephone Encounter (Signed)
These meds were just sent.  Please call and clarify as this seems far too early to refill

## 2012-04-28 NOTE — Telephone Encounter (Signed)
OV 05/27/11. Last filled 04/23/12 Ativan #90 x 0 , 04/13/12 Hydrocodone #30 x1

## 2012-04-29 ENCOUNTER — Other Ambulatory Visit: Payer: Self-pay

## 2012-04-29 ENCOUNTER — Telehealth: Payer: Self-pay

## 2012-04-29 NOTE — Telephone Encounter (Signed)
Megan called back from [facility] I gave her a verbal script for pt since she said they never received one. Rx done. I advised her we sent one 04/23/12.  MW

## 2012-04-29 NOTE — Telephone Encounter (Signed)
Megan from [facility] pharmacy called concerning pt about Ativan stating Ronaldo Miyamoto from retirement home told her they couldn't accept script because a new one was sent (meaning double meds) Aundra Millet stated she only received 1Rx request from [place] and wanted [place] to call. I called them Back but staff stated Aundra Millet was busy and would call me back.       MW

## 2012-04-29 NOTE — Telephone Encounter (Signed)
Ronaldo Miyamoto called back to get information from [place] concerning pt Rx . Ronaldo Miyamoto states trying to track down pt meds not at facility don't know if facility is involved or pharmacy and where it was faxed. Answered all questions. Ronaldo Miyamoto stated understanding.     MW

## 2012-04-29 NOTE — Telephone Encounter (Signed)
needs hard copy of ativan script faxed to [phone]

## 2012-04-29 NOTE — Telephone Encounter (Signed)
Nance Pear called in from [facility] concerning pt left message on triage line stating he wanted to know who request RXs for pt because he may have to do an investigation to see what is going on. Called Kyle back on hold for a long time because staff said Ronaldo Miyamoto was on phone.      MW

## 2012-05-05 ENCOUNTER — Encounter: Payer: Self-pay | Admitting: Family Medicine

## 2012-05-05 NOTE — Telephone Encounter (Signed)
Noted manager Janna Arch has been notified and is in process of managing concerns

## 2012-05-06 NOTE — Telephone Encounter (Signed)
Letter created and saved advising [facility] site manager paperwork should be completed by surgeon.  Signed and mailed today.

## 2012-05-12 ENCOUNTER — Other Ambulatory Visit: Payer: Self-pay

## 2012-05-12 NOTE — Telephone Encounter (Signed)
c/o Bilateral knee pain and swelling, was doing PT/OT for the knee pain  Ut she no longer qualifies because of her insurance. She was seeing Dr.Cabbell at [facility] who told the patient he would send her to Ortho but she has not had an apt yet. Tonya the RN would like to know if we could add Lidoderm patches for the patient's discomfort she is currently taking Hydrocodone and Robaxin prn.  Archie Patten also waned to know the status on Ortho referral since Dr.Cabbell's office has not returned their call. Please advise     KP

## 2012-05-12 NOTE — Telephone Encounter (Signed)
If Dr Franky Macho made the referral, she will need to check the status w/ him Lidoderm patches are typically not covered by Medicare b/c their only indication is for post-herpetic neuralgia but we can send a script, #30

## 2012-05-13 MED ORDER — LIDOCAINE 5 % EX PTCH: 1.0000 | MEDICATED_PATCH | CUTANEOUS | Status: DC

## 2012-05-13 NOTE — Telephone Encounter (Signed)
Rx sent 

## 2012-05-21 ENCOUNTER — Telehealth: Payer: Self-pay

## 2012-05-21 ENCOUNTER — Ambulatory Visit (INDEPENDENT_AMBULATORY_CARE_PROVIDER_SITE_OTHER): Payer: Medicare Other | Admitting: Family Medicine

## 2012-05-21 ENCOUNTER — Telehealth: Payer: Self-pay | Admitting: *Deleted

## 2012-05-21 VITALS — BP 110/58 | HR 68 | Temp 98.1°F

## 2012-05-21 DIAGNOSIS — N63 Unspecified lump in unspecified breast: Secondary | ICD-10-CM | POA: Insufficient documentation

## 2012-05-21 DIAGNOSIS — E538 Deficiency of other specified B group vitamins: Secondary | ICD-10-CM

## 2012-05-21 DIAGNOSIS — M25561 Pain in right knee: Secondary | ICD-10-CM

## 2012-05-21 DIAGNOSIS — E785 Hyperlipidemia, unspecified: Secondary | ICD-10-CM

## 2012-05-21 DIAGNOSIS — M25569 Pain in unspecified knee: Secondary | ICD-10-CM

## 2012-05-21 DIAGNOSIS — M47812 Spondylosis without myelopathy or radiculopathy, cervical region: Secondary | ICD-10-CM

## 2012-05-21 DIAGNOSIS — E039 Hypothyroidism, unspecified: Secondary | ICD-10-CM

## 2012-05-21 LAB — LIPID PANEL
Cholesterol: 120 mg/dL (ref 0–200)
HDL: 48.5 mg/dL (ref >39.00)
LDL Cholesterol: 47 mg/dL (ref 0–99)
Total CHOL/HDL Ratio: 2
Triglycerides: 122 mg/dL (ref 0.0–149.0)
VLDL: 24.4 mg/dL (ref 0.0–40.0)

## 2012-05-21 LAB — BASIC METABOLIC PANEL
Calcium: 9.3 mg/dL (ref 8.4–10.5)
Creatinine, Ser: 0.7 mg/dL (ref 0.4–1.2)
GFR: 109.65 mL/min (ref >60.00)
Sodium: 140 mEq/L (ref 135–145)

## 2012-05-21 LAB — HEPATIC FUNCTION PANEL
Alkaline Phosphatase: 67 U/L (ref 39–117)
Bilirubin, Direct: 0.2 mg/dL (ref 0.0–0.3)
Total Bilirubin: 0.9 mg/dL (ref 0.3–1.2)
Total Protein: 7.3 g/dL (ref 6.0–8.3)

## 2012-05-21 NOTE — Patient Instructions (Addendum)
Follow up in 3 months to see how things are going We'll notify you of your lab results and make any changes if needed Someone will call you with your orthopedic appt for the knee, the pain appt for the neck, and the mammogram appt for the breast pain Call with any questions or concerns Hang in there!

## 2012-05-21 NOTE — Telephone Encounter (Signed)
Pt daughter states that she would like for dr Beverely Low to given her a call to discuss last OV, and Pt behavior. Pt granddaughter indicated that she needs a letter.

## 2012-05-21 NOTE — Telephone Encounter (Signed)
Pt grand daughter called [facility] stating she is in a meeting will be running late for [place] mother appt at 1pm today. Should be here by 1:30pm at the latest. Pt will be here on time with someone from facility but grand daughter wants to sit in on appt to help assist and answer questions. Pt grand daughter wanted to make you aware.     MW

## 2012-05-21 NOTE — Telephone Encounter (Signed)
Not able to wait 30 min for appt

## 2012-05-21 NOTE — Progress Notes (Signed)
  Subjective:    Patient ID: Mallory Burke, female    DOB: [birth year], 78 y.o.   MRN: [id number]  HPI R sided neck/head pain- pt had spinal surgery w/ Dr Mikal Plane and reports pain never improved.  Is now wheel chair bound.  Not able to use walker.  Very difficult to get around.  Pt reports Dr Mikal Plane is treating pain by 'feeding me pills', hydrocodone and robaxin.  'why am i in so much pain'.  Pain is worst at surgical site.  Doesn't feel PT was helping.  Has quit PT but asked them to return b/c she would like to start walking again.  L leg pain- pt reports knee pain for 'months'.  Has not seen ortho for this.  Has very limited mobility.  Pt reports knee will swell.  Unable to bear weight w/out feeling as if it's 'going out'.  R breast pain- x6 months, some drainage from breast when she wakes.  Drainage is dark brown.  Unable to see breast.  + lump.  Pt has hx of similar according to previous visits but pt doesn't recall this.   Review of Systems For ROS see HPI     Objective:   Physical Exam  Vitals reviewed. Constitutional: She appears well-developed and well-nourished. No distress.  HENT:  Head: Normocephalic and atraumatic.  Neck: Neck supple.  Cardiovascular: Normal rate, regular rhythm and normal heart sounds.   Pulmonary/Chest: Effort normal and breath sounds normal. No respiratory distress. She has no wheezes. She has no rales.       Pt refused breast exam  Neurological: She is alert.       Not oriented to time- aware of person and place 'doctor's office'  Skin: Skin is warm and dry.  Psychiatric:       Agitated Scattered thought process Difficulty answering questions          Assessment & Plan:

## 2012-05-22 ENCOUNTER — Other Ambulatory Visit: Payer: Self-pay | Admitting: *Deleted

## 2012-05-22 ENCOUNTER — Encounter: Payer: Self-pay | Admitting: *Deleted

## 2012-05-22 ENCOUNTER — Telehealth: Payer: Self-pay | Admitting: *Deleted

## 2012-05-22 MED ORDER — LEVOTHYROXINE SODIUM 137 MCG PO CAPS: 1.0000 | ORAL_CAPSULE | Freq: Every day | ORAL | Status: DC

## 2012-05-22 NOTE — Telephone Encounter (Signed)
Baldwin Jamaica, RN 05/22/2012 9:30 AM Signed  Called granddaughter, [name] to inquire of type of letter needed from Dr. Beverely Low. She states that she is in the process of filing for guardianship for her grandmother and needed documentation of behaviors patient has displayed during OV with Dr. Beverely Low. There is clear documentation of labile behavior from the 05/27/2011 OV per MD notes. Granddaughter stated that her hearing is scheduled for 11/27 and would like written documentation from MD to support her case. Vidal Schwalbe, RN

## 2012-05-22 NOTE — Telephone Encounter (Signed)
Called granddaughter, [name] to inquire of type of letter needed from Dr. Beverely Low. She states that she is in the process of filing for guardianship for her grandmother and needed documentation of behaviors patient has displayed during OV with Dr. Beverely Low. There is clear documentation of labile behavior from the 05/27/2011 OV per MD notes. Granddaughter stated that her hearing is scheduled for 11/27 and would like written documentation from MD to support her case. Vidal Schwalbe, RN

## 2012-05-22 NOTE — Telephone Encounter (Signed)
See other encounter.

## 2012-05-22 NOTE — Telephone Encounter (Signed)
Please call family member and see what type of letter they need.  Thanks!

## 2012-05-24 ENCOUNTER — Encounter (HOSPITAL_COMMUNITY): Payer: Self-pay | Admitting: Emergency Medicine

## 2012-05-24 ENCOUNTER — Emergency Department (HOSPITAL_COMMUNITY)
Admission: EM | Admit: 2012-05-24 | Discharge: 2012-05-24 | Disposition: A | Discharge status: Home / Self Care | Payer: Medicare Other | Attending: Emergency Medicine | Admitting: Emergency Medicine

## 2012-05-24 DIAGNOSIS — E079 Disorder of thyroid, unspecified: Secondary | ICD-10-CM | POA: Insufficient documentation

## 2012-05-24 DIAGNOSIS — Z87440 Personal history of urinary (tract) infections: Secondary | ICD-10-CM | POA: Insufficient documentation

## 2012-05-24 DIAGNOSIS — F3289 Other specified depressive episodes: Secondary | ICD-10-CM | POA: Insufficient documentation

## 2012-05-24 DIAGNOSIS — Z79899 Other long term (current) drug therapy: Secondary | ICD-10-CM | POA: Insufficient documentation

## 2012-05-24 DIAGNOSIS — Z872 Personal history of diseases of the skin and subcutaneous tissue: Secondary | ICD-10-CM | POA: Insufficient documentation

## 2012-05-24 DIAGNOSIS — M549 Dorsalgia, unspecified: Secondary | ICD-10-CM | POA: Insufficient documentation

## 2012-05-24 DIAGNOSIS — Z862 Personal history of diseases of the blood and blood-forming organs and certain disorders involving the immune mechanism: Secondary | ICD-10-CM | POA: Insufficient documentation

## 2012-05-24 DIAGNOSIS — E785 Hyperlipidemia, unspecified: Secondary | ICD-10-CM | POA: Insufficient documentation

## 2012-05-24 DIAGNOSIS — G8929 Other chronic pain: Secondary | ICD-10-CM | POA: Insufficient documentation

## 2012-05-24 DIAGNOSIS — F329 Major depressive disorder, single episode, unspecified: Secondary | ICD-10-CM | POA: Insufficient documentation

## 2012-05-24 DIAGNOSIS — Z8679 Personal history of other diseases of the circulatory system: Secondary | ICD-10-CM | POA: Insufficient documentation

## 2012-05-24 DIAGNOSIS — Z9889 Other specified postprocedural states: Secondary | ICD-10-CM | POA: Insufficient documentation

## 2012-05-24 MED ORDER — KETOROLAC TROMETHAMINE 60 MG/2ML IM SOLN
60.0000 mg | Freq: Once | INTRAMUSCULAR | Status: AC
  Administered 2012-05-24: 60 mg via INTRAMUSCULAR
  Filled 2012-05-24: qty 2

## 2012-05-24 NOTE — ED Provider Notes (Signed)
History     CSN: [id number]  Arrival date & time 05/24/12  1013   First MD Initiated Contact with Patient 05/24/12 1100      Chief Complaint  Patient presents with  . Back Pain    (Consider location/radiation/quality/duration/timing/severity/associated sxs/prior treatment) HPI Comments: Mallory Burke is a 78 y.o. Female who resides in an assisted living facility. Today she was getting assistance to move about in her bed. When she developed severe low back pain. The pain radiates to her right knee. She has had this previously. She has chronic pain. She takes Norco. She saw her PCP 05/21/12. At that time her condition was stable. No changes were made to the treatment plan. The patient is unable to walk and is transferred in a wheelchair, chronically. She is a poor historian.    Level V Caveat- confusion  Patient is a 78 y.o. female presenting with back pain. The history is provided by the patient.  Back Pain     Past Medical History  Diagnosis Date  . Chicken pox   . Hyperlipidemia   . Depression   . Hypothyroid   . Bronchitis     hx of  . Anemia   . Blood transfusion     hx of  . Hepatitis     Hx of  . Urinary frequency   . Urinary tract infection     hx of  . Arthritis     "legs, neck"  . Ulcer, stomach peptic     hx of  . Heart murmur     patient's primary Dr. Cranston Neighbor    Past Surgical History  Procedure Date  . Back surgery     x3, fusion and rod  . Abdominal hysterectomy   . Knee arthroscopy 2007    right  . Abdominal surgery   . Tubal ligation   . Dilation and curettage of uterus   . Breast lumpectomy     right side  . Posterior cervical fusion/foraminotomy 11/13/2011    Procedure: POSTERIOR CERVICAL FUSION/FORAMINOTOMY LEVEL 1;  Surgeon: Carmela Hurt, MD;  Location: [facility];  Service: Neurosurgery;  Laterality: N/A;  Cervical one Cervical two posterior transpedicular fusion with lateral mass screws    Family History  Problem Relation Age  of Onset  . Arthritis    . Hyperlipidemia    . Hypertension      History  Substance Use Topics  . Smoking status: Never Smoker   . Smokeless tobacco: Not on file  . Alcohol Use: No    OB History    Grav Para Term Preterm Abortions TAB SAB Ect Mult Living                  Review of Systems  Unable to perform ROS Musculoskeletal: Positive for back pain.    Allergies  Review of patient's allergies indicates no known allergies.  Home Medications   Current Outpatient Rx  Name  Route  Sig  Dispense  Refill  . ALBUTEROL SULFATE HFA 108 (90 BASE) MCG/ACT IN AERS   Inhalation   Inhale 2 puffs into the lungs every 4 (four) hours as needed. For cough         . BENZONATATE 200 MG PO CAPS   Oral   Take 200 mg by mouth every 6 (six) hours as needed. For cough         . CITALOPRAM HYDROBROMIDE 10 MG PO TABS   Oral   Take 10 mg by mouth  every morning.          . DONEPEZIL HCL 10 MG PO TABS   Oral   Take 10 mg by mouth at bedtime.          Marland Kitchen GABAPENTIN 300 MG PO CAPS   Oral   Take 300 mg by mouth at bedtime.         Marland Kitchen HYDROCHLOROTHIAZIDE 12.5 MG PO CAPS   Oral   Take 12.5 mg by mouth every morning.          Marland Kitchen HYDROCODONE-ACETAMINOPHEN 10-325 MG PO TABS   Oral   Take 1 tablet by mouth every 4 (four) hours as needed. For pain   30 tablet   1   . HYDROCORTISONE 2.5 % EX CREA   Topical   Apply topically 2 (two) times daily. For irritation         . LEVOTHYROXINE SODIUM 150 MCG PO TABS   Oral   Take 150 mcg by mouth daily before breakfast.          . LORAZEPAM 0.5 MG PO TABS   Oral   Take 1 tablet (0.5 mg total) by mouth 3 (three) times daily.   90 tablet   0   . METHOCARBAMOL 750 MG PO TABS   Oral   Take 750 mg by mouth at bedtime as needed. Muscle spasms.         Marland Kitchen MICONAZOLE NITRATE 2 % EX CREA   Topical   Apply topically 2 (two) times daily. Apply topically to bottom twice a day         . MOMETASONE FUROATE 50 MCG/ACT NA SUSP    Nasal   Place 2 sprays into the nose daily.         Marland Kitchen OMEPRAZOLE 20 MG PO CPDR   Oral   Take 20 mg by mouth daily.         Marland Kitchen POTASSIUM CHLORIDE CRYS ER 20 MEQ PO TBCR   Oral   Take 20 mEq by mouth daily with breakfast. Do not  Crush. Take with food.         Marland Kitchen ROSUVASTATIN CALCIUM 20 MG PO TABS   Oral   Take 20 mg by mouth daily.         . B-12 1000 MCG SL SUBL   Sublingual   Place 1 tablet under the tongue daily.   30 each   2   . LOPERAMIDE HCL 2 MG PO CAPS   Oral   Take 2 mg by mouth every 6 (six) hours as needed. For diarrhea           BP 142/72  Pulse 47  Temp 98.3 F (36.8 C) (Oral)  Resp 16  SpO2 95%  Physical Exam  Nursing note and vitals reviewed. Constitutional: She appears well-developed and well-nourished.  HENT:  Head: Normocephalic and atraumatic.  Eyes: Conjunctivae normal and EOM are normal. Pupils are equal, round, and reactive to light.  Neck: Normal range of motion and phonation normal. Neck supple.  Cardiovascular: Normal rate, regular rhythm and intact distal pulses.   Pulmonary/Chest: Effort normal and breath sounds normal. She exhibits no tenderness.  Abdominal: Soft. She exhibits no distension. There is no tenderness. There is no guarding.  Musculoskeletal:       Mild right lumbar tenderness to palpation. She moves slowly secondary to low back pain.  Neurological: She is alert. She has normal strength. She exhibits normal muscle tone.  Date not assessed, due to chronic gait disability  Skin: Skin is warm and dry.  Psychiatric: She has a normal mood and affect. Her behavior is normal.    ED Course  Procedures (including critical care time)   Emergency department treatment: IM Toradol.   Nursing notes, applicable records and vitals reviewed.  Radiologic Images/Reports reviewed.    1. Back pain, chronic       MDM  Exacerbation of chronic pain.  No findings for cauda equina syndrome, infectious process or  trauma to the back.     Plan: Home Medications- usual; Home Treatments- rest, heat; Recommended follow up- PCP prn       Flint Melter, MD 05/27/12 1743
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
## 2012-05-24 NOTE — ED Notes (Signed)
XBM:WU13<KG> Expected date:05/24/12<BR> Expected time:10:04 AM<BR> Means of arrival:Ambulance<BR> Comments:<BR> EMS

## 2012-05-26 ENCOUNTER — Other Ambulatory Visit: Payer: Self-pay | Admitting: *Deleted

## 2012-05-26 MED ORDER — B-12 1000 MCG SL SUBL: 1.0000 | SUBLINGUAL_TABLET | Freq: Every day | SUBLINGUAL | Status: DC

## 2012-05-29 ENCOUNTER — Encounter: Payer: Self-pay | Admitting: Physical Medicine & Rehabilitation

## 2012-05-29 ENCOUNTER — Telehealth: Payer: Self-pay

## 2012-05-29 NOTE — Telephone Encounter (Signed)
Mallory Burke from [facility] LMOV[facility] advising service started today for pt and nursing will continue to help with medications and pain management for pt. She advises PT and OT have been ordered for pt. Pt vital signs are stable. If you have any questions  Okey Regal CB# [phone] MW

## 2012-05-29 NOTE — Telephone Encounter (Signed)
Noted  

## 2012-06-01 ENCOUNTER — Other Ambulatory Visit: Payer: Self-pay | Admitting: Family Medicine

## 2012-06-01 NOTE — Telephone Encounter (Signed)
refill LORazepam (Tab) 0.5 MG Take 1 tablet (0.5 mg total) by mouth 3 (three) times daily.--last wrt 10.17.13 #90--last ov 11.14.13

## 2012-06-01 NOTE — Telephone Encounter (Signed)
Ok to refill 

## 2012-06-02 MED ORDER — LORAZEPAM 0.5 MG PO TABS: 0.5000 mg | ORAL_TABLET | Freq: Three times a day (TID) | ORAL | Status: DC

## 2012-06-02 NOTE — Telephone Encounter (Signed)
Ok for #90, 1 refill 

## 2012-06-02 NOTE — Telephone Encounter (Signed)
Rx sent 

## 2012-06-14 NOTE — Assessment & Plan Note (Signed)
Chronic problem, due for labs.  Adjust meds prn. 

## 2012-06-14 NOTE — Assessment & Plan Note (Signed)
Chronic problem, due for labs.  Replete prn.

## 2012-06-14 NOTE — Assessment & Plan Note (Signed)
New- per pt report.  Refused breast exam.  Overdue for mammo.

## 2012-06-14 NOTE — Assessment & Plan Note (Signed)
Chronic problem.  Pt continues to have pain despite having surgery.  At this time, will refer to pain clinic for ongoing management.  Pt expressed understanding and is in agreement w/ plan.

## 2012-06-14 NOTE — Assessment & Plan Note (Signed)
Chronic problem, overdue on labs.  Check labs.  Adjust meds prn

## 2012-06-14 NOTE — Assessment & Plan Note (Signed)
New to provider.  Pt reports sensation of leg giving way w/ weight bearing.  Has not seen ortho for this b/c for the past year has been in tx for neck pain and s/p surgery.  Will refer as pt is only minimally ambulatory and any assistance would improve her quality of life.

## 2012-06-15 ENCOUNTER — Telehealth: Payer: Self-pay | Admitting: *Deleted

## 2012-06-15 NOTE — Telephone Encounter (Signed)
Order requested for UA due to frequent urination but only able to go a little at times. Pt also c/o pain in knee since injection and right under arm pain. Orders request to increase Pt pain med due to increase in pain. Marland KitchenPlease advise

## 2012-06-15 NOTE — Telephone Encounter (Signed)
Discuss with Eunice Blase, order faxed for UA.

## 2012-06-15 NOTE — Telephone Encounter (Signed)
Order already given for UA and culture If pt is having increased knee pain she needs to discuss this w/ ortho as they are the ones who did the injection.  They will need to help manage her pain.

## 2012-06-15 NOTE — Telephone Encounter (Signed)
Pt has also been referred to pain management

## 2012-06-16 ENCOUNTER — Other Ambulatory Visit: Payer: Self-pay | Admitting: Family Medicine

## 2012-06-16 ENCOUNTER — Encounter: Payer: Self-pay | Admitting: Physical Medicine & Rehabilitation

## 2012-06-16 ENCOUNTER — Ambulatory Visit: Payer: Medicare Other | Admitting: Physical Medicine & Rehabilitation

## 2012-06-16 ENCOUNTER — Ambulatory Visit
Admission: RE | Admit: 2012-06-16 | Discharge: 2012-06-16 | Disposition: A | Discharge status: Home / Self Care | Payer: Medicare Other | Source: Ambulatory Visit | Attending: Family Medicine | Admitting: Family Medicine

## 2012-06-16 ENCOUNTER — Ambulatory Visit (HOSPITAL_BASED_OUTPATIENT_CLINIC_OR_DEPARTMENT_OTHER): Payer: Medicare Other | Admitting: Physical Medicine & Rehabilitation

## 2012-06-16 ENCOUNTER — Encounter: Payer: Medicare Other | Attending: Physical Medicine & Rehabilitation

## 2012-06-16 VITALS — BP 115/38 | HR 70 | Resp 14 | Ht 65.0 in | Wt 161.0 lb

## 2012-06-16 DIAGNOSIS — E039 Hypothyroidism, unspecified: Secondary | ICD-10-CM | POA: Insufficient documentation

## 2012-06-16 DIAGNOSIS — N63 Unspecified lump in unspecified breast: Secondary | ICD-10-CM

## 2012-06-16 DIAGNOSIS — M1710 Unilateral primary osteoarthritis, unspecified knee: Secondary | ICD-10-CM

## 2012-06-16 DIAGNOSIS — IMO0002 Reserved for concepts with insufficient information to code with codable children: Secondary | ICD-10-CM

## 2012-06-16 DIAGNOSIS — M24569 Contracture, unspecified knee: Secondary | ICD-10-CM | POA: Insufficient documentation

## 2012-06-16 DIAGNOSIS — R52 Pain, unspecified: Secondary | ICD-10-CM

## 2012-06-16 DIAGNOSIS — M542 Cervicalgia: Secondary | ICD-10-CM | POA: Insufficient documentation

## 2012-06-16 DIAGNOSIS — M25569 Pain in unspecified knee: Secondary | ICD-10-CM | POA: Insufficient documentation

## 2012-06-16 DIAGNOSIS — M961 Postlaminectomy syndrome, not elsewhere classified: Secondary | ICD-10-CM

## 2012-06-16 DIAGNOSIS — E785 Hyperlipidemia, unspecified: Secondary | ICD-10-CM | POA: Insufficient documentation

## 2012-06-16 NOTE — Patient Instructions (Signed)
Instruction listed on nursing home form which has been scan

## 2012-06-16 NOTE — Progress Notes (Signed)
Subjective:    Patient ID: Mallory Burke, female    DOB: March 17, 1933, 78 y.o.   MRN: [id number] Chief complaint bilateral knee pain HPI 78 year old female who resides in a skilled nursing facility as she complains of bilateral knee pain. This has been going on for years. She has had knee injections reported about 2 weeks ago, I do not have these records Knee x-rays done in September 2013 shows severe tricompartmental degenerative changes She also has a history of several falls resulting in emergency department visits. During one fall she disrupted C1-C2 and required posterior fusion with cerclage wires  She also has some chronic neck pain that she complains of which responded partially to heating pad Lumbar spine x-rays reviewed she has L3-S1 posterior spinal fusion and instrumentation Pain Inventory Average Pain 8 Pain Right Now 8 My pain is constant and aching  In the last 24 hours, has pain interfered with the following? General activity 10 Relation with others 7 Enjoyment of life 3 What TIME of day is your pain at its worst? morning Sleep (in general) Poor  Pain is worse with: bending and sitting Pain improves with: rest and heat/ice Relief from Meds: 2  Mobility use a walker ability to climb steps?  no do you drive?  no use a wheelchair  Function retired I need assistance with the following:  dressing, bathing, meal prep, household duties and shopping  Neuro/Psych tremor trouble walking anxiety  Prior Studies Any changes since last visit?  no  Physicians involved in your care Any changes since last visit?  no   Family History  Problem Relation Age of Onset  . Arthritis    . Hyperlipidemia    . Hypertension     History   Social History  . Marital Status: Divorced    Spouse Name: N/A    Number of Children: N/A  . Years of Education: N/A   Social History Main Topics  . Smoking status: Never Smoker   . Smokeless tobacco: None  . Alcohol Use: No  .  Drug Use: No  . Sexually Active:    Other Topics Concern  . None   Social History Narrative  . None   Past Surgical History  Procedure Date  . Back surgery     x3, fusion and rod  . Abdominal hysterectomy   . Knee arthroscopy 2007    right  . Abdominal surgery   . Tubal ligation   . Dilation and curettage of uterus   . Breast lumpectomy     right side  . Posterior cervical fusion/foraminotomy 11/13/2011    Procedure: POSTERIOR CERVICAL FUSION/FORAMINOTOMY LEVEL 1;  Surgeon: Carmela Hurt, MD;  Location: [facility];  Service: Neurosurgery;  Laterality: N/A;  Cervical one Cervical two posterior transpedicular fusion with lateral mass screws   Past Medical History  Diagnosis Date  . Chicken pox   . Hyperlipidemia   . Depression   . Hypothyroid   . Bronchitis     hx of  . Anemia   . Blood transfusion     hx of  . Hepatitis     Hx of  . Urinary frequency   . Urinary tract infection     hx of  . Arthritis     "legs, neck"  . Ulcer, stomach peptic     hx of  . Heart murmur     patient's primary Dr. Cranston Neighbor   BP 115/38  Pulse 70  Resp 14  Ht 5\' 5"  (  1.651 m)  Wt 161 lb (73.029 kg)  BMI 26.79 kg/m2  SpO2 96%    Review of Systems  Musculoskeletal: Positive for back pain and gait problem.  Neurological: Positive for tremors and weakness.  Psychiatric/Behavioral: The patient is nervous/anxious.   All other systems reviewed and are negative.       Objective:   Physical Exam  Constitutional: She appears well-developed and well-nourished. She appears listless.  HENT:  Head: Normocephalic and atraumatic.  Eyes: Conjunctivae normal and EOM are normal. Pupils are equal, round, and reactive to light.  Musculoskeletal:       Right knee: She exhibits decreased range of motion and deformity. She exhibits no effusion. no tenderness found.       Left knee: She exhibits decreased range of motion and deformity. She exhibits no effusion. no tenderness found.   Neurological: She appears listless. Gait abnormal.       Motor strength is 4/5 in bilateral deltoid, biceps, triceps, grip, hip flexor, knee extensors, ankle dorsi flexion plantar flexor Non-ambulatory Knee Flexion contracture to 30 bilaterally  Psychiatric: She is slowed and withdrawn. Cognition and memory are impaired.          Assessment & Plan:  1. Bilateral knee pain due to osteoarthritis and chronic knee contracture. She  had knee injections per her report which were not very helpful. Will trial  Diclofenac gel. She is  on narcotic analgesics and has a history of falls. She also appears to be mildly sedated. I certainly would not recommend increasing narcotic analgesics or changing to long-acting agents. 2. Neck pain, recommend heat 30 minutes several times per day range of motion. I do not think any formal physical therapy would be very helpful, Based on her on her cognition RTC when necessary

## 2012-06-18 ENCOUNTER — Telehealth: Payer: Self-pay | Admitting: *Deleted

## 2012-06-18 MED ORDER — CEPHALEXIN 500 MG PO CAPS: 500.0000 mg | ORAL_CAPSULE | Freq: Two times a day (BID) | ORAL | Status: DC

## 2012-06-19 ENCOUNTER — Telehealth: Payer: Self-pay | Admitting: *Deleted

## 2012-06-19 NOTE — Telephone Encounter (Signed)
Called and LM for Koleen Nimrod with [facility] @ [phone]) informing her that it was ok to extend the orders for the pt.  Asked Koleen Nimrod to  Call and let me know she got the message.  Also spoke with the pt's caregiver (Armellle Obas), and she verified that the pt is only using [place] as an [facility] agency.//AB/CMA

## 2012-06-19 NOTE — Telephone Encounter (Signed)
Koleen Nimrod with [facility] regarding the pt requesting approving to extend her home PT to 2x/week for 2 weeks,1x/week for 3 weeks, starting 06-21-12.  Will be continuing to work toward [employer]. Strength and activity tolerance pain management, and indivdual of transfers, appropiate use of her wheelchair and if tolerated some gaits training.  Wants a call back and let her know if orders have been approved.//AB/CMA

## 2012-06-19 NOTE — Telephone Encounter (Signed)
Ok to extend orders but we discovered yesterday that pt is getting both [place] and another [facility] agency.  We need to make sure she is only getting 1 agency or neither will be covered.

## 2012-06-22 DIAGNOSIS — M1710 Unilateral primary osteoarthritis, unspecified knee: Secondary | ICD-10-CM

## 2012-06-22 DIAGNOSIS — Z9181 History of falling: Secondary | ICD-10-CM

## 2012-06-22 DIAGNOSIS — M542 Cervicalgia: Secondary | ICD-10-CM

## 2012-06-22 DIAGNOSIS — M47812 Spondylosis without myelopathy or radiculopathy, cervical region: Secondary | ICD-10-CM

## 2012-06-22 DIAGNOSIS — R262 Difficulty in walking, not elsewhere classified: Secondary | ICD-10-CM

## 2012-06-22 DIAGNOSIS — F329 Major depressive disorder, single episode, unspecified: Secondary | ICD-10-CM

## 2012-06-22 NOTE — Telephone Encounter (Signed)
Mallory Burke return call stating that she received verbal to continue with care.

## 2012-06-23 ENCOUNTER — Telehealth: Payer: Self-pay | Admitting: *Deleted

## 2012-06-23 DIAGNOSIS — M542 Cervicalgia: Secondary | ICD-10-CM

## 2012-06-23 DIAGNOSIS — M25569 Pain in unspecified knee: Secondary | ICD-10-CM

## 2012-06-23 DIAGNOSIS — Z0279 Encounter for issue of other medical certificate: Secondary | ICD-10-CM

## 2012-06-23 NOTE — Telephone Encounter (Signed)
Mallory Burke, PT with [facility], needs new order for wheelchair. The one patient is using is a Development worker, community.

## 2012-06-24 NOTE — Telephone Encounter (Signed)
May order new WC

## 2012-06-24 NOTE — Telephone Encounter (Signed)
Left message approving wheel chair order. Requested  Adrianne (PT) notify us what they need from [place] besides verbal ok.

## 2012-06-24 NOTE — Telephone Encounter (Addendum)
On 06-18-12 we received results of UA and UC which was faxed from [facility] lab, the urine was collected  at [facility].   Spoke with the pt's caregiver (Alethea) and informed her that we received the results, and that Dr. Beverely Low wants to put the pt on an ATB.  Alethea asked that the  Rx be faxed to [facility] to her attention.   New Rx for ATB was ordered and faxed to [facility]-Att: Alethea.//AB/CMA

## 2012-06-29 ENCOUNTER — Telehealth: Payer: Self-pay | Admitting: *Deleted

## 2012-06-29 DIAGNOSIS — M25569 Pain in unspecified knee: Secondary | ICD-10-CM

## 2012-06-29 NOTE — Telephone Encounter (Signed)
Order for wheelchair was faxed to [place].

## 2012-06-29 NOTE — Telephone Encounter (Signed)
Patient needs prescription for wheelchair faxed to [facility]. Also needs order for JVAK. Can fax to [phone].

## 2012-07-06 ENCOUNTER — Telehealth: Payer: Self-pay | Admitting: *Deleted

## 2012-07-06 DIAGNOSIS — M170 Bilateral primary osteoarthritis of knee: Secondary | ICD-10-CM

## 2012-07-31 ENCOUNTER — Telehealth: Payer: Self-pay | Admitting: Family Medicine

## 2012-07-31 MED ORDER — LORAZEPAM 0.5 MG PO TABS: 0.5000 mg | ORAL_TABLET | Freq: Three times a day (TID) | ORAL | Status: DC

## 2012-07-31 NOTE — Telephone Encounter (Signed)
Last OV 05-21-12, last filled 06-02-45

## 2012-07-31 NOTE — Telephone Encounter (Signed)
Refill: Lorazepam 0.5mg  tablet. Take 1 tab by mouth three times daily.

## 2012-07-31 NOTE — Telephone Encounter (Signed)
Ok for #90, 3 refills 

## 2012-07-31 NOTE — Telephone Encounter (Signed)
Rx sent 

## 2012-08-05 DIAGNOSIS — IMO0001 Reserved for inherently not codable concepts without codable children: Secondary | ICD-10-CM

## 2012-08-05 DIAGNOSIS — F329 Major depressive disorder, single episode, unspecified: Secondary | ICD-10-CM

## 2012-08-05 DIAGNOSIS — Z9181 History of falling: Secondary | ICD-10-CM

## 2012-08-05 DIAGNOSIS — M542 Cervicalgia: Secondary | ICD-10-CM

## 2012-08-05 DIAGNOSIS — M47812 Spondylosis without myelopathy or radiculopathy, cervical region: Secondary | ICD-10-CM

## 2012-08-05 DIAGNOSIS — M1710 Unilateral primary osteoarthritis, unspecified knee: Secondary | ICD-10-CM

## 2012-08-17 DIAGNOSIS — Z0279 Encounter for issue of other medical certificate: Secondary | ICD-10-CM

## 2012-08-20 ENCOUNTER — Emergency Department (HOSPITAL_COMMUNITY)
Admission: EM | Admit: 2012-08-20 | Discharge: 2012-08-20 | Disposition: A | Discharge status: Skilled Nursing Facility | Payer: Medicare Other | Attending: Emergency Medicine | Admitting: Emergency Medicine

## 2012-08-20 ENCOUNTER — Encounter (HOSPITAL_COMMUNITY): Payer: Self-pay | Admitting: Emergency Medicine

## 2012-08-20 ENCOUNTER — Ambulatory Visit: Payer: Medicare Other | Admitting: Family Medicine

## 2012-08-20 ENCOUNTER — Emergency Department (HOSPITAL_COMMUNITY): Payer: Medicare Other

## 2012-08-20 DIAGNOSIS — Y9389 Activity, other specified: Secondary | ICD-10-CM | POA: Insufficient documentation

## 2012-08-20 DIAGNOSIS — Y9210 Unspecified residential institution as the place of occurrence of the external cause: Secondary | ICD-10-CM | POA: Insufficient documentation

## 2012-08-20 DIAGNOSIS — Z8711 Personal history of peptic ulcer disease: Secondary | ICD-10-CM | POA: Insufficient documentation

## 2012-08-20 DIAGNOSIS — Z87440 Personal history of urinary (tract) infections: Secondary | ICD-10-CM | POA: Insufficient documentation

## 2012-08-20 DIAGNOSIS — E039 Hypothyroidism, unspecified: Secondary | ICD-10-CM | POA: Insufficient documentation

## 2012-08-20 DIAGNOSIS — N390 Urinary tract infection, site not specified: Secondary | ICD-10-CM | POA: Insufficient documentation

## 2012-08-20 DIAGNOSIS — S0990XA Unspecified injury of head, initial encounter: Secondary | ICD-10-CM | POA: Insufficient documentation

## 2012-08-20 DIAGNOSIS — Z8619 Personal history of other infectious and parasitic diseases: Secondary | ICD-10-CM | POA: Insufficient documentation

## 2012-08-20 DIAGNOSIS — Z862 Personal history of diseases of the blood and blood-forming organs and certain disorders involving the immune mechanism: Secondary | ICD-10-CM | POA: Insufficient documentation

## 2012-08-20 DIAGNOSIS — F329 Major depressive disorder, single episode, unspecified: Secondary | ICD-10-CM | POA: Insufficient documentation

## 2012-08-20 DIAGNOSIS — R011 Cardiac murmur, unspecified: Secondary | ICD-10-CM | POA: Insufficient documentation

## 2012-08-20 DIAGNOSIS — W19XXXA Unspecified fall, initial encounter: Secondary | ICD-10-CM

## 2012-08-20 DIAGNOSIS — Z8709 Personal history of other diseases of the respiratory system: Secondary | ICD-10-CM | POA: Insufficient documentation

## 2012-08-20 DIAGNOSIS — W1789XA Other fall from one level to another, initial encounter: Secondary | ICD-10-CM | POA: Insufficient documentation

## 2012-08-20 DIAGNOSIS — F068 Other specified mental disorders due to known physiological condition: Secondary | ICD-10-CM | POA: Insufficient documentation

## 2012-08-20 DIAGNOSIS — E785 Hyperlipidemia, unspecified: Secondary | ICD-10-CM | POA: Insufficient documentation

## 2012-08-20 DIAGNOSIS — F3289 Other specified depressive episodes: Secondary | ICD-10-CM | POA: Insufficient documentation

## 2012-08-20 DIAGNOSIS — Z8739 Personal history of other diseases of the musculoskeletal system and connective tissue: Secondary | ICD-10-CM | POA: Insufficient documentation

## 2012-08-20 DIAGNOSIS — I498 Other specified cardiac arrhythmias: Secondary | ICD-10-CM | POA: Insufficient documentation

## 2012-08-20 DIAGNOSIS — Z79899 Other long term (current) drug therapy: Secondary | ICD-10-CM | POA: Insufficient documentation

## 2012-08-20 LAB — URINALYSIS, ROUTINE W REFLEX MICROSCOPIC
Bilirubin Urine: NEGATIVE
Glucose, UA: NEGATIVE mg/dL
Nitrite: POSITIVE — AB
Specific Gravity, Urine: 1.011 (ref 1.005–1.030)
pH: 7 (ref 5.0–8.0)

## 2012-08-20 LAB — CBC WITH DIFFERENTIAL/PLATELET
Basophils Relative: 0 % (ref 0–1)
Eosinophils Relative: 3 % (ref 0–5)
HCT: 41.7 % (ref 36.0–46.0)
Hemoglobin: 13.7 g/dL (ref 12.0–15.0)
MCHC: 32.9 g/dL (ref 30.0–36.0)
MCV: 92.7 fL (ref 78.0–100.0)
Monocytes Absolute: 0.2 10*3/uL (ref 0.1–1.0)
Monocytes Relative: 7 % (ref 3–12)
Neutro Abs: 1.3 10*3/uL — ABNORMAL LOW (ref 1.7–7.7)

## 2012-08-20 LAB — POCT I-STAT, CHEM 8
BUN: 7 mg/dL (ref 6–23)
Calcium, Ion: 1.22 mmol/L (ref 1.13–1.30)
Chloride: 105 mEq/L (ref 96–112)
Glucose, Bld: 81 mg/dL (ref 70–99)
HCT: 45 % (ref 36.0–46.0)
TCO2: 26 mmol/L (ref 0–100)

## 2012-08-20 LAB — URINE MICROSCOPIC-ADD ON

## 2012-08-20 MED ORDER — OXYCODONE-ACETAMINOPHEN 5-325 MG PO TABS
1.0000 | ORAL_TABLET | Freq: Once | ORAL | Status: AC
  Administered 2012-08-20: 1 via ORAL
  Filled 2012-08-20: qty 1

## 2012-08-20 MED ORDER — CEPHALEXIN 250 MG PO CAPS: 250.0000 mg | ORAL_CAPSULE | Freq: Four times a day (QID) | ORAL | Status: DC

## 2012-08-20 MED ORDER — CEPHALEXIN 250 MG PO CAPS: 500.0000 mg | ORAL_CAPSULE | Freq: Once | ORAL | Status: DC

## 2012-08-20 NOTE — ED Notes (Signed)
Pt. Arrived via EMS from Assisted Living Facility. Pt. Fell reaching for the phone found her on the floor.  Pt. Stated, My legs hurt and my knees hurt really bad.

## 2012-08-20 NOTE — ED Provider Notes (Signed)
History     CSN: [id number]  Arrival date & time 08/20/12  0902   First MD Initiated Contact with Patient 08/20/12 [phone]      Chief Complaint  Patient presents with  . Fall    (Consider location/radiation/quality/duration/timing/severity/associated sxs/prior treatment) Patient is a 79 y.o. female presenting with fall. The history is provided by the patient and the EMS personnel. No language interpreter was used.  Fall The accident occurred less than 1 hour ago. Fall occurred: Sitting or reaching for her cell phone. She landed on a hard floor. The point of impact was the head. The pain is at a severity of 6/10. The pain is moderate. She was not ambulatory at the scene. There was no entrapment after the fall. There was no drug use involved in the accident. There was no alcohol use involved in the accident. Pertinent negatives include no visual change, no fever, no numbness, no nausea, no vomiting, no hematuria, no headaches, no loss of consciousness and no tingling. The symptoms are aggravated by activity. She has tried nothing for the symptoms.   79 year old female coming from [facility] after a fall while reaching for her cell phone from a sitting position. Patient has a past medical history of dementia, hyperlipidemia, depression, bronchitis, uti, multiple surgeries, including cervical spine and lumbar spine. Point tenderness to cervical spine.  Staff reports that she was found face down hitting her head and had been there no longer than 20 minutes.  Patient is w/c bound. Patient has chronic LE pain and goes to a pain clinic for bilateral knee pain. pcp is Dr. Beverely [facility].   C/o neck pain and LE pain.  No acute distress.  Confused intermittant. Level 5 caviat due to dementia.   Past Medical History  Diagnosis Date  . Chicken pox   . Hyperlipidemia   . Depression   . Hypothyroid   . Bronchitis     hx of  . Anemia   . Blood transfusion     hx of  .  Hepatitis     Hx of  . Urinary frequency   . Urinary tract infection     hx of  . Arthritis     "legs, neck"  . Ulcer, stomach peptic     hx of  . Heart murmur     patient's primary Dr. Cranston Neighbor    Past Surgical History  Procedure Laterality Date  . Back surgery      x3, fusion and rod  . Abdominal hysterectomy    . Knee arthroscopy  2007    right  . Abdominal surgery    . Tubal ligation    . Dilation and curettage of uterus    . Breast lumpectomy      right side  . Posterior cervical fusion/foraminotomy  11/13/2011    Procedure: POSTERIOR CERVICAL FUSION/FORAMINOTOMY LEVEL 1;  Surgeon: Carmela Hurt, MD;  Location: [facility];  Service: Neurosurgery;  Laterality: N/A;  Cervical one Cervical two posterior transpedicular fusion with lateral mass screws    Family History  Problem Relation Age of Onset  . Arthritis    . Hyperlipidemia    . Hypertension      History  Substance Use Topics  . Smoking status: Never Smoker   . Smokeless tobacco: Not on file  . Alcohol Use: No    OB History   Grav Para Term Preterm Abortions TAB SAB Ect Mult Living  Review of Systems  Unable to perform ROS Constitutional: Negative for fever.  Gastrointestinal: Negative for nausea and vomiting.  Genitourinary: Negative for hematuria.  Neurological: Negative for tingling, loss of consciousness, numbness and headaches.    Allergies  Review of patient's allergies indicates no known allergies.  Home Medications   Current Outpatient Rx  Name  Route  Sig  Dispense  Refill  . albuterol (PROVENTIL HFA;VENTOLIN HFA) 108 (90 BASE) MCG/ACT inhaler   Inhalation   Inhale 2 puffs into the lungs every 4 (four) hours as needed. For cough         . cephALEXin (KEFLEX) 500 MG capsule   Oral   Take 1 capsule (500 mg total) by mouth 2 (two) times daily. For 7 days   14 capsule   0   . citalopram (CELEXA) 10 MG tablet   Oral   Take 10 mg by mouth every morning.           . Cyanocobalamin (B-12) 1000 MCG SUBL   Sublingual   Place 1 tablet under the tongue daily.   30 each   2   . donepezil (ARICEPT) 10 MG tablet   Oral   Take 10 mg by mouth at bedtime.          . gabapentin (NEURONTIN) 300 MG capsule   Oral   Take 300 mg by mouth at bedtime.         . hydrochlorothiazide (,MICROZIDE/HYDRODIURIL,) 12.5 MG capsule   Oral   Take 12.5 mg by mouth every morning.          Marland Kitchen HYDROcodone-acetaminophen (NORCO) 10-325 MG per tablet   Oral   Take 1 tablet by mouth every 4 (four) hours as needed. For pain   30 tablet   1   . hydrocortisone 2.5 % cream   Topical   Apply topically 2 (two) times daily. For irritation         . levothyroxine (SYNTHROID, LEVOTHROID) 150 MCG tablet   Oral   Take 150 mcg by mouth daily before breakfast.          . loperamide (IMODIUM) 2 MG capsule   Oral   Take 2 mg by mouth every 6 (six) hours as needed. For diarrhea         . LORazepam (ATIVAN) 0.5 MG tablet   Oral   Take 1 tablet (0.5 mg total) by mouth 3 (three) times daily.   90 tablet   3   . methocarbamol (ROBAXIN) 750 MG tablet   Oral   Take 750 mg by mouth at bedtime as needed. Muscle spasms.         . miconazole (BAZA ANTIFUNGAL) 2 % cream   Topical   Apply topically 2 (two) times daily. Apply topically to bottom twice a day         . mometasone (NASONEX) 50 MCG/ACT nasal spray   Nasal   Place 2 sprays into the nose daily.         Marland Kitchen omeprazole (PRILOSEC) 20 MG capsule   Oral   Take 20 mg by mouth daily.         . potassium chloride SA (K-DUR,KLOR-CON) 20 MEQ tablet   Oral   Take 20 mEq by mouth daily with breakfast. Do not  Crush. Take with food.         . rosuvastatin (CRESTOR) 20 MG tablet   Oral   Take 20 mg by mouth daily.  There were no vitals taken for this visit.  Physical Exam  Nursing note and vitals reviewed. Constitutional: She is oriented to person, place, and time. She appears well-developed  and well-nourished.  HENT:  Head: Normocephalic and atraumatic.  Eyes: Conjunctivae and EOM are normal. Pupils are equal, round, and reactive to light.  Neck: Normal range of motion. Neck supple.  Point tenderness to the cervical spine  Cardiovascular: Intact distal pulses.  Bradycardia present.   Murmur heard. Pulmonary/Chest: Effort normal.  Abdominal: Soft.  Musculoskeletal: Normal range of motion. She exhibits no edema and no tenderness.  Neurological: She is alert and oriented to person, place, and time. She has normal reflexes.  Skin: Skin is warm and dry.  Psychiatric: She has a normal mood and affect.    ED Course  Procedures (including critical care time)  Labs Reviewed - No data to display No results found.   No diagnosis found.    MDM  79yo female with Fall and UTI from nursing facility.  Rx for keflex. She will follow up with her pcp tomorrow.  First dose of keflex in ER today.  CT head and cervical spine shows no fractures today. WBC 3.2 .    Labs Reviewed  URINALYSIS, ROUTINE W REFLEX MICROSCOPIC - Abnormal; Notable for the following:    Hgb urine dipstick MODERATE (*)    Nitrite POSITIVE (*)    Leukocytes, UA TRACE (*)    All other components within normal limits  CBC WITH DIFFERENTIAL - Abnormal; Notable for the following:    WBC 3.2 (*)    Neutrophils Relative 41 (*)    Neutro Abs 1.3 (*)    Lymphocytes Relative 49 (*)    All other components within normal limits  URINE MICROSCOPIC-ADD ON - Abnormal; Notable for the following:    Bacteria, UA MANY (*)    All other components within normal limits  POCT I-STAT, CHEM 8 - Abnormal; Notable for the following:    Hemoglobin 15.3 (*)    All other components within normal limits          Remi Haggard, NP 08/21/12 [phone]

## 2012-08-24 NOTE — ED Provider Notes (Signed)
Medical screening examination/treatment/procedure(s) were performed by non-physician practitioner and as supervising physician I was immediately available for consultation/collaboration.  Terell Kincy T Desarae Placide, MD 08/24/12 1528 

## 2012-08-27 ENCOUNTER — Encounter: Payer: Self-pay | Admitting: Family Medicine

## 2012-08-27 ENCOUNTER — Ambulatory Visit (INDEPENDENT_AMBULATORY_CARE_PROVIDER_SITE_OTHER): Payer: Medicare Other | Admitting: Family Medicine

## 2012-08-27 VITALS — BP 120/90 | HR 62 | Temp 98.6°F

## 2012-08-27 DIAGNOSIS — E039 Hypothyroidism, unspecified: Secondary | ICD-10-CM

## 2012-08-27 DIAGNOSIS — M25569 Pain in unspecified knee: Secondary | ICD-10-CM

## 2012-08-27 DIAGNOSIS — M25562 Pain in left knee: Secondary | ICD-10-CM

## 2012-08-27 DIAGNOSIS — E538 Deficiency of other specified B group vitamins: Secondary | ICD-10-CM

## 2012-08-27 LAB — CBC WITH DIFFERENTIAL/PLATELET
Basophils Relative: 0.6 % (ref 0.0–3.0)
Hemoglobin: 13.5 g/dL (ref 12.0–15.0)
Lymphocytes Relative: 43 % (ref 12.0–46.0)
Monocytes Relative: 6.8 % (ref 3.0–12.0)
Neutro Abs: 1.9 10*3/uL (ref 1.4–7.7)
RBC: 4.36 Mil/uL (ref 3.87–5.11)

## 2012-08-27 LAB — TSH: TSH: 0.04 u[IU]/mL — ABNORMAL LOW (ref 0.35–5.50)

## 2012-08-27 NOTE — Progress Notes (Signed)
  Subjective:    Patient ID: Mallory Burke, female    DOB: [birth year], 79 y.o.   MRN: [id number]  HPI Hypothyroid- chronic problems, TSH in Nov was low and pt was supposed to decrease med to 137 mcg.  According to [facility] MAR, pt still on .  + fatigue.  Frequent falls- was seen in ER last week, continues to have knee pain.  Reports she has not seen ortho despite referral in November.  Having swelling, pain, and stiffness of L knee in particular.  B12 deficiency- insurance wouldn't cover B12 injxns w/out dx of pernicious anemia.  Pt is not anemic.  Has been taking sublingual B12.   Review of Systems For ROS see HPI     Objective:   Physical Exam  Vitals reviewed. Constitutional: She appears well-developed and well-nourished. No distress.  HENT:  Head: Normocephalic and atraumatic.  Neck: Neck supple. No thyromegaly present.  Cardiovascular: Normal rate, regular rhythm and normal heart sounds.   Pulmonary/Chest: Effort normal and breath sounds normal. No respiratory distress. She has no wheezes. She has no rales.  Musculoskeletal: She exhibits no edema.  L knee swelling w/ arthritic bony change.  Pain along medial joint line w/ small effusion  Lymphadenopathy:    She has no cervical adenopathy.  Neurological: She is alert.  Pt having difficulty w/ memory, unable to answer certain questions  Skin: Skin is warm and dry.          Assessment & Plan:

## 2012-08-27 NOTE — Patient Instructions (Addendum)
We'll call you with your ortho appt We'll notify you of your lab results and make any changes if needed Hang in there!!!

## 2012-08-27 NOTE — Assessment & Plan Note (Signed)
Chronic problem.  Pt's TSH was low at last visit and plan was for pt to decrease synthroid to 137 mcg from .  No evidence that this was done as pt's current MAR shows she remains on daily.  Repeat labs and adjust meds prn.

## 2012-08-27 NOTE — Assessment & Plan Note (Signed)
Pt was started on oral B12 at last visit b/c insurance would not pay for B12 injxns w/out dx of pernicious anemia.  Will recheck labs today to determine whether sublingual replacement is adequate.

## 2012-08-27 NOTE — Assessment & Plan Note (Signed)
Ongoing problem.  Pt was referred to ortho in November but for some reason, pt was not seen.  Will re-refer.  Pt already on pain meds, not willing to increase or change meds.  Will follow.

## 2012-09-02 ENCOUNTER — Telehealth: Payer: Self-pay | Admitting: *Deleted

## 2012-09-02 DIAGNOSIS — E039 Hypothyroidism, unspecified: Secondary | ICD-10-CM

## 2012-09-02 MED ORDER — LEVOTHYROXINE SODIUM 137 MCG PO TABS: 137.0000 ug | ORAL_TABLET | Freq: Every day | ORAL | Status: DC

## 2012-09-02 NOTE — Telephone Encounter (Signed)
Message copied by Verdie Shire on Wed Sep 02, 2012  9:40 AM ------      Message from: Sheliah Hatch      Created: Fri Aug 28, 2012  5:03 PM       B12 is now normal- continue sublingual B12 supplements      TSH is again too low.  Must decrease synthroid to 137 mcg and repeat labs in 3 months ------

## 2012-09-02 NOTE — Telephone Encounter (Signed)
Spoke with the pt's granddaughter(Tina) and informed her of the pt's recent lab results and note.  Also called [facility] and spoke with Laqueta Redd and informed her I was calling to give the pt's recent lab results and new orders.  Ms. Darrol Jump stated that there was not a nurse there just yet to take the recents and orders, so she asked if I would fax the results and note along with the new orders to her.  Note is that the pt's B12 is normal and she need to continue sublingual B12 supplements, and her TSH is again too low, so must decrease Synthroid to 137 mcg and repeat labs in 3 mos.  Faxed recent lab results and note along with new rx for Synthroid to [facility].

## 2012-10-05 ENCOUNTER — Telehealth: Payer: Self-pay | Admitting: Family Medicine

## 2012-10-05 NOTE — Telephone Encounter (Signed)
Please call pt's family and ask if they would be ok w/ transferring pt's care to Dr Sonia Baller.  At this time, b/c pt has such difficulty making it to appts, I'm concerned that I'm not able to provide her the best care possible.  The nursing home frequently wants to have her care done via fax and this is not always appropriate- we actually need to see some of what they are talking about.  Dr Sonia Baller and her associates are able to visit local nursing homes and provide care as frequently as once a week if needed.  At this time, this seems like it would be the better option.  If they are agreeable to this, we will call Dr Sonia Baller and arrange a transfer of care.

## 2012-10-05 NOTE — Telephone Encounter (Signed)
LM @ (4:42pm) with pt's granddaughter [name]) asking her to RTC.//AB/CMA

## 2012-10-06 ENCOUNTER — Telehealth: Payer: Self-pay | Admitting: *Deleted

## 2012-10-06 NOTE — Telephone Encounter (Addendum)
Spoke with grand-daughter [name] who states she is not comfortable transferring her grandmother's care to Dr. Sonia Baller. She feels that Dr Sonia Baller does not know her grandmother as well as Dr. Beverely Low. She is to speak with her family and will call us back with an answer within the week.

## 2012-10-12 NOTE — Telephone Encounter (Addendum)
Spoke with the pt's granddaughter(Tina) and she stated that she spoke with someone last wk and informed them that she will need to talk with her family and get back with [place].  She stated that the pt does not like the nursing home she's at now and she wants to leave, so they are looking for another nursing home for her.  I again informed [name] of Dr. Rennis Golden recommendation, and she stated that the problems with is that the pt want know the new doctor and if the pt could be seen on the days she's off she could bring her to the doctor.  Explained to her that Dr. Rennis Golden schedule maybe full when she can come.  She stated that the family will discuss this more, and I asked her to please [place] know when they make an decision.  She stated that she will.  Verbally informed Dr. Beverely Low of this.//AB/CMA

## 2012-10-19 ENCOUNTER — Telehealth: Payer: Self-pay | Admitting: Family Medicine

## 2012-10-19 NOTE — Telephone Encounter (Signed)
Refill: hydrocodone-acetaminophen 10-325 mg.

## 2012-10-20 MED ORDER — HYDROCODONE-ACETAMINOPHEN 10-325 MG PO TABS: 1.0000 | ORAL_TABLET | Freq: Four times a day (QID) | ORAL | Status: DC | PRN

## 2012-10-20 NOTE — Telephone Encounter (Signed)
Last ordering date:08-20-12 by Historical provider-pt was seen in the ER on (08-20-12) for fall and UTI.  Last refill by Dr. Beverely Low was 04-13-12 and last OV:08-27-12. Pt is a resident at [facility].  Please advise.//AB/CMA

## 2012-10-20 NOTE — Telephone Encounter (Signed)
Refill x1 

## 2012-10-20 NOTE — Telephone Encounter (Signed)
Rx printed.    KP 

## 2012-10-27 ENCOUNTER — Emergency Department (HOSPITAL_COMMUNITY)
Admission: EM | Admit: 2012-10-27 | Discharge: 2012-10-27 | Disposition: A | Discharge status: Skilled Nursing Facility | Payer: Medicare Other | Attending: Emergency Medicine | Admitting: Emergency Medicine

## 2012-10-27 ENCOUNTER — Emergency Department (HOSPITAL_COMMUNITY): Payer: Medicare Other

## 2012-10-27 ENCOUNTER — Encounter (HOSPITAL_COMMUNITY): Payer: Self-pay | Admitting: Emergency Medicine

## 2012-10-27 DIAGNOSIS — Z79899 Other long term (current) drug therapy: Secondary | ICD-10-CM | POA: Insufficient documentation

## 2012-10-27 DIAGNOSIS — M1710 Unilateral primary osteoarthritis, unspecified knee: Secondary | ICD-10-CM | POA: Insufficient documentation

## 2012-10-27 DIAGNOSIS — Z87448 Personal history of other diseases of urinary system: Secondary | ICD-10-CM | POA: Insufficient documentation

## 2012-10-27 DIAGNOSIS — M1990 Unspecified osteoarthritis, unspecified site: Secondary | ICD-10-CM

## 2012-10-27 DIAGNOSIS — R609 Edema, unspecified: Secondary | ICD-10-CM

## 2012-10-27 DIAGNOSIS — Z8709 Personal history of other diseases of the respiratory system: Secondary | ICD-10-CM | POA: Insufficient documentation

## 2012-10-27 DIAGNOSIS — Z8711 Personal history of peptic ulcer disease: Secondary | ICD-10-CM | POA: Insufficient documentation

## 2012-10-27 DIAGNOSIS — M25562 Pain in left knee: Secondary | ICD-10-CM

## 2012-10-27 DIAGNOSIS — Z8679 Personal history of other diseases of the circulatory system: Secondary | ICD-10-CM | POA: Insufficient documentation

## 2012-10-27 DIAGNOSIS — E039 Hypothyroidism, unspecified: Secondary | ICD-10-CM | POA: Insufficient documentation

## 2012-10-27 DIAGNOSIS — Z8619 Personal history of other infectious and parasitic diseases: Secondary | ICD-10-CM | POA: Insufficient documentation

## 2012-10-27 DIAGNOSIS — Z8739 Personal history of other diseases of the musculoskeletal system and connective tissue: Secondary | ICD-10-CM | POA: Insufficient documentation

## 2012-10-27 DIAGNOSIS — Z87440 Personal history of urinary (tract) infections: Secondary | ICD-10-CM | POA: Insufficient documentation

## 2012-10-27 DIAGNOSIS — F3289 Other specified depressive episodes: Secondary | ICD-10-CM | POA: Insufficient documentation

## 2012-10-27 DIAGNOSIS — E785 Hyperlipidemia, unspecified: Secondary | ICD-10-CM | POA: Insufficient documentation

## 2012-10-27 DIAGNOSIS — IMO0002 Reserved for concepts with insufficient information to code with codable children: Secondary | ICD-10-CM | POA: Insufficient documentation

## 2012-10-27 DIAGNOSIS — M25569 Pain in unspecified knee: Secondary | ICD-10-CM | POA: Insufficient documentation

## 2012-10-27 DIAGNOSIS — Z862 Personal history of diseases of the blood and blood-forming organs and certain disorders involving the immune mechanism: Secondary | ICD-10-CM | POA: Insufficient documentation

## 2012-10-27 DIAGNOSIS — M79609 Pain in unspecified limb: Secondary | ICD-10-CM

## 2012-10-27 DIAGNOSIS — F329 Major depressive disorder, single episode, unspecified: Secondary | ICD-10-CM | POA: Insufficient documentation

## 2012-10-27 MED ORDER — ACETAMINOPHEN 325 MG PO TABS
650.0000 mg | ORAL_TABLET | Freq: Once | ORAL | Status: AC
  Administered 2012-10-27: 650 mg via ORAL
  Filled 2012-10-27: qty 2

## 2012-10-27 NOTE — ED Provider Notes (Signed)
History     CSN: [id number]  Arrival date & time 10/27/12  1015   First MD Initiated Contact with Patient 10/27/12 1017      Chief Complaint  Patient presents with  . Leg Pain    (Consider location/radiation/quality/duration/timing/severity/associated sxs/prior treatment) Patient is a 79 y.o. female presenting with leg pain. The history is provided by the patient and medical records.  Leg Pain Location:  Knee Injury: no   Knee location:  R knee and L knee Pain details:    Quality:  Aching   Radiates to:  Does not radiate   Severity:  Moderate   Onset quality:  Gradual   Timing:  Constant   Progression:  Waxing and waning Associated symptoms: no fever     Past Medical History  Diagnosis Date  . Chicken pox   . Hyperlipidemia   . Depression   . Hypothyroid   . Bronchitis     hx of  . Anemia   . Blood transfusion     hx of  . Hepatitis     Hx of  . Urinary frequency   . Urinary tract infection     hx of  . Arthritis     "legs, neck"  . Ulcer, stomach peptic     hx of  . Heart murmur     patient's primary Dr. Cranston Neighbor    Past Surgical History  Procedure Laterality Date  . Back surgery      x3, fusion and rod  . Abdominal hysterectomy    . Knee arthroscopy  2007    right  . Abdominal surgery    . Tubal ligation    . Dilation and curettage of uterus    . Breast lumpectomy      right side  . Posterior cervical fusion/foraminotomy  11/13/2011    Procedure: POSTERIOR CERVICAL FUSION/FORAMINOTOMY LEVEL 1;  Surgeon: Carmela Hurt, MD;  Location: [facility];  Service: Neurosurgery;  Laterality: N/A;  Cervical one Cervical two posterior transpedicular fusion with lateral mass screws    Family History  Problem Relation Age of Onset  . Arthritis    . Hyperlipidemia    . Hypertension      History  Substance Use Topics  . Smoking status: Never Smoker   . Smokeless tobacco: Not on file  . Alcohol Use: No    OB History   Grav Para Term Preterm  Abortions TAB SAB Ect Mult Living                  Review of Systems  Constitutional: Negative for fever and chills.  Respiratory: Negative for cough, shortness of breath and wheezing.   Cardiovascular: Negative for chest pain and palpitations.  Gastrointestinal: Negative for vomiting, abdominal pain, diarrhea and constipation.  Genitourinary: Negative for dysuria, flank pain, decreased urine volume, difficulty urinating and menstrual problem.  Skin: Negative for rash and wound.  Neurological: Negative for seizures, syncope, facial asymmetry and light-headedness.  Psychiatric/Behavioral: Negative for confusion and agitation.  All other systems reviewed and are negative.    Allergies  Review of patient's allergies indicates no known allergies.  Home Medications   Current Outpatient Rx  Name  Route  Sig  Dispense  Refill  . albuterol (PROVENTIL HFA;VENTOLIN HFA) 108 (90 BASE) MCG/ACT inhaler   Inhalation   Inhale 2 puffs into the lungs every 4 (four) hours as needed for shortness of breath.          . benzonatate (TESSALON)  200 MG capsule   Oral   Take 200 mg by mouth 3 (three) times daily as needed for cough.         . citalopram (CELEXA) 10 MG tablet   Oral   Take 10 mg by mouth every morning.          . Cyanocobalamin (B-12) 1000 MCG SUBL   Sublingual   Place 1 tablet under the tongue daily.   30 each   2   . diclofenac sodium (VOLTAREN) 1 % GEL   Topical   Apply 2 g topically 4 (four) times daily. To each knee         . donepezil (ARICEPT) 10 MG tablet   Oral   Take 10 mg by mouth at bedtime.          . gabapentin (NEURONTIN) 300 MG capsule   Oral   Take 300 mg by mouth at bedtime.         . hydrochlorothiazide (,MICROZIDE/HYDRODIURIL,) 12.5 MG capsule   Oral   Take 12.5 mg by mouth every morning.          Marland Kitchen HYDROcodone-acetaminophen (NORCO) 10-325 MG per tablet   Oral   Take 1 tablet by mouth every 6 (six) hours as needed for pain.   30  tablet   0   . levothyroxine (SYNTHROID) 137 MCG tablet   Oral   Take 1 tablet (137 mcg total) by mouth daily.   30 tablet   2   . loperamide (IMODIUM) 2 MG capsule   Oral   Take 2 mg by mouth 4 (four) times daily as needed for diarrhea or loose stools.         Marland Kitchen LORazepam (ATIVAN) 0.5 MG tablet   Oral   Take 0.5 mg by mouth 3 (three) times daily.         . methocarbamol (ROBAXIN) 750 MG tablet   Oral   Take 750 mg by mouth 3 (three) times daily. Muscle spasms.         . mometasone (NASONEX) 50 MCG/ACT nasal spray   Nasal   Place 2 sprays into the nose daily.         Marland Kitchen nystatin ointment (MYCOSTATIN)   Topical   Apply 1 application topically 2 (two) times daily.         Marland Kitchen omeprazole (PRILOSEC) 20 MG capsule   Oral   Take 20 mg by mouth daily.         . potassium chloride SA (K-DUR,KLOR-CON) 20 MEQ tablet   Oral   Take 20 mEq by mouth daily with breakfast. Do not  Crush. Take with food.         . rosuvastatin (CRESTOR) 20 MG tablet   Oral   Take 20 mg by mouth daily.           BP 143/74  Pulse 61  Temp(Src) 98 F (36.7 C) (Rectal)  Resp 16  SpO2 99%  Physical Exam  Nursing note and vitals reviewed. Constitutional: She is oriented to person, place, and time. She appears well-developed and well-nourished.  HENT:  Head: Normocephalic and atraumatic.  Right Ear: External ear normal.  Left Ear: External ear normal.  Nose: Nose normal.  Mouth/Throat: Oropharynx is clear and moist. No oropharyngeal exudate.  Eyes: Conjunctivae are normal. Pupils are equal, round, and reactive to light.  Neck: Normal range of motion. Neck supple.  Cardiovascular: Normal rate, regular rhythm, normal heart sounds and intact distal pulses.  Exam reveals no gallop and no friction rub.   No murmur heard. Pulmonary/Chest: Effort normal and breath sounds normal. No respiratory distress. She has no wheezes. She has no rales. She exhibits no tenderness.  Abdominal: Soft.  Bowel sounds are normal. She exhibits no distension and no mass. There is no tenderness. There is no rebound and no guarding.  Musculoskeletal: Normal range of motion. She exhibits tenderness (mild diffusely over bilateral legs; moderate over bilateral knees). She exhibits no edema.  Neurological: She is alert and oriented to person, place, and time.  Skin: Skin is warm and dry.  Psychiatric: She has a normal mood and affect. Her behavior is normal. Judgment and thought content normal.    ED Course  Procedures (including critical care time)  Labs Reviewed - No data to display Dg Pelvis Portable  10/27/2012  *RADIOLOGY REPORT*  Clinical Data: Fall, hip pain  PORTABLE PELVIS  Comparison: Radiographs of the right femur obtained concurrently; prior radiographs of the lumbar spine 03/14/2012  Findings: Single frontal view the pelvis demonstrates no definitive acute fracture or malalignment.  The bones are osteopenic. Incompletely imaged surgical changes of posterior lumbar interbody fusion with bilateral pedicle screw and rod construct.  Bone graft harvest site at the left iliac crest.  Numerous soft tissue calcifications in the bilateral gluteal regions.  Unremarkable bowel gas pattern.  IMPRESSION:  1.  No acute fracture identified. 2.  Osteopenia limits evaluation for nondisplaced fractures.   Original Report Authenticated By: Malachy Moan, M.D.    Dg Femur Right Port  10/27/2012  *RADIOLOGY REPORT*  Clinical Data: Right leg pain, fall  PORTABLE RIGHT FEMUR - 2 VIEW  Comparison: Prior radiographs of the right hip 03/14/2012; concurrently obtained radiographs the pelvis  Findings: The bones are osteopenic.  No acute fracture, malalignment or knee joint effusion identified.  Moderately advanced tricompartmental osteoarthritis of the knee with near bone on bone contact in the lateral compartment.  IMPRESSION:  1.  No acute fracture or malalignment. 2.  Moderately severe tricompartmental osteoarthritis  of the knee. 3.  Osteopenia   Original Report Authenticated By: Malachy Moan, M.D.      1. Knee pain, left   2. Osteoarthritis       MDM  79 yo F w/hx of chronic bilateral leg pain presents for exacerbation of chronic pain. No recent trauma. However, pt is immobile at baseline in a nursing facility. Bilateral trace edema and diffuse tenderness overlying bilateral legs. Afebrile and no warmth or eraythema overlying legs. Bilateral imaging negative for fracture and ultrasounds negative bilaterally for DVT of lower extremities. Suspect pain secondary to osteoarthritis. Pain treated in ED. Patient given return precautions, including worsening of signs or symptoms. Patient instructed to follow-up with primary care physician.          Clemetine Marker, MD 10/27/12 1304

## 2012-10-27 NOTE — ED Notes (Signed)
Pt presents to ED via EMS with c/o leg pain and feeling "sick"and headache, chills and nausea. NAD.

## 2012-10-27 NOTE — ED Notes (Signed)
Pt discharged to [facility] with [facility].

## 2012-10-27 NOTE — ED Provider Notes (Signed)
I saw and evaluated the patient, reviewed the resident's note and I agree with the findings and plan.  Patient with chronic leg pain for 2 months as worse with movement. On physical exam she has no sign of vascular insufficiency. Awaiting for results of Doppler ultrasound.  Toy Baker, MD 10/27/12 1250

## 2012-10-27 NOTE — Progress Notes (Signed)
VASCULAR LAB PRELIMINARY  PRELIMINARY  PRELIMINARY  PRELIMINARY  Bilateral lower extremity venous duplex completed.    Preliminary report:  Technically difficult due to position of the legs, and tremors of the lower extremities probably caused by pain. There is no obvious evidence of DVT, superficial thrombus, or Baker's cyst bilaterally.  Graycie Halley, RVS 10/27/2012, 12:21 PM

## 2012-10-27 NOTE — ED Notes (Signed)
[facility] contacted for transport 

## 2012-10-29 NOTE — ED Provider Notes (Signed)
I saw and evaluated the patient, reviewed the resident's note and I agree with the findings and plan.  Rielly Brunn T Katey Barrie, MD 10/29/12 1611 

## 2012-11-10 ENCOUNTER — Telehealth: Payer: Self-pay | Admitting: *Deleted

## 2012-11-10 NOTE — Telephone Encounter (Signed)
Ok for #30 

## 2012-11-10 NOTE — Telephone Encounter (Signed)
Last OV 08-27-12, last filled 10-20-12 #30

## 2012-11-11 MED ORDER — HYDROCODONE-ACETAMINOPHEN 10-325 MG PO TABS: 1.0000 | ORAL_TABLET | Freq: Four times a day (QID) | ORAL | Status: DC | PRN

## 2012-11-11 NOTE — Telephone Encounter (Signed)
Rx sent 

## 2012-11-27 ENCOUNTER — Telehealth: Payer: Self-pay | Admitting: General Practice

## 2012-11-27 ENCOUNTER — Other Ambulatory Visit: Payer: Self-pay | Admitting: General Practice

## 2012-11-27 MED ORDER — LORAZEPAM 0.5 MG PO TABS: 0.5000 mg | ORAL_TABLET | Freq: Three times a day (TID) | ORAL | Status: DC

## 2012-11-27 MED ORDER — HYDROCODONE-ACETAMINOPHEN 10-325 MG PO TABS: 1.0000 | ORAL_TABLET | Freq: Four times a day (QID) | ORAL | Status: DC | PRN

## 2012-11-27 NOTE — Telephone Encounter (Signed)
Received a request for a hardcopy of pt's Lorazepam and Hydrocodone prescriptions. These were faxed to [facility] on 11-27-12 #[phone]. Chart updated to show this reorder of medications.

## 2013-01-12 ENCOUNTER — Telehealth: Payer: Self-pay | Admitting: *Deleted

## 2013-01-12 ENCOUNTER — Encounter: Payer: Self-pay | Admitting: Lab

## 2013-01-12 NOTE — Telephone Encounter (Signed)
Ok for #60 

## 2013-01-12 NOTE — Telephone Encounter (Signed)
Last OV 08-27-12, Last refilled 11-27-12 #60

## 2013-01-13 ENCOUNTER — Ambulatory Visit (INDEPENDENT_AMBULATORY_CARE_PROVIDER_SITE_OTHER): Payer: Medicare Other | Admitting: Family Medicine

## 2013-01-13 ENCOUNTER — Encounter: Payer: Self-pay | Admitting: Family Medicine

## 2013-01-13 VITALS — BP 110/68 | HR 60 | Temp 98.6°F

## 2013-01-13 DIAGNOSIS — L03317 Cellulitis of buttock: Secondary | ICD-10-CM

## 2013-01-13 DIAGNOSIS — L0231 Cutaneous abscess of buttock: Secondary | ICD-10-CM

## 2013-01-13 MED ORDER — HYDROCODONE-ACETAMINOPHEN 10-325 MG PO TABS: 1.0000 | ORAL_TABLET | Freq: Four times a day (QID) | ORAL | Status: DC | PRN

## 2013-01-13 MED ORDER — DOXYCYCLINE HYCLATE 100 MG PO TABS: 100.0000 mg | ORAL_TABLET | Freq: Two times a day (BID) | ORAL | Status: DC

## 2013-01-13 NOTE — Patient Instructions (Addendum)
Start the doxy twice daily- take w/ food We'll send home health to [facility] for wound care If the wound worsen, you develop fever, or other concerns- please go to the ER Hang in there!!

## 2013-01-13 NOTE — Telephone Encounter (Signed)
Rx sent 

## 2013-01-13 NOTE — Progress Notes (Signed)
  Subjective:    Patient ID: Mallory Burke, female    DOB: 04-Oct-1932, 79 y.o.   MRN: [id number]  HPI 'another boil'- pt reports large 'knot' present for 1-2 weeks.  + painful.  Pt unable to determine whether this is on her bottom or her vagina.  Hx of similar.  No fevers.  No nausea.  Aide who accompanied pt is not able to provide additional hx and pt has underlying confusion/dementia.   Review of Systems For ROS see HPI     Objective:   Physical Exam  Vitals reviewed. Constitutional: She appears well-developed and well-nourished.  Neurological: She is alert.  Pt unable to stand w/out total dependence on 2 people, unable to get on exam table Incontinent of bladder in office  Skin: Skin is warm. There is erythema (surround 3 cm area of induration on medial L buttock w/ central drainage but no fluctuance.  + TTP).          Assessment & Plan:

## 2013-01-14 NOTE — Assessment & Plan Note (Signed)
New to provider.  Unable to perform thorough assessment due to pt's inability to stand or get on exam table.  From what was seen, there is no area to I&D as it is already draining and there is no pocket of fluctuance.  Start doxy.  Stressed importance of cleanliness, frequently changing her pull-up w/ staff present.  Will refer for home health wound care.  Will continue to follow.

## 2013-01-15 ENCOUNTER — Telehealth: Payer: Self-pay | Admitting: Family Medicine

## 2013-01-15 NOTE — Telephone Encounter (Signed)
FYI GF/RN 

## 2013-01-15 NOTE — Telephone Encounter (Signed)
[facility] stated that pt does have 3 small knots. Its look like a boil and a little of drainage. Also they are going to go ahead and admit her.  Calling to give an update.

## 2013-01-15 NOTE — Telephone Encounter (Signed)
Patient is returning a call from [place]. She wants to clarify that the patient was admitted to [facility] services, not the hospital. No call back requested.

## 2013-01-27 ENCOUNTER — Telehealth: Payer: Self-pay | Admitting: *Deleted

## 2013-01-27 NOTE — Telephone Encounter (Signed)
Received call from Trifha,LPN w/ [facility] on [phone] [phone] asking for verbal orders from the doctor for PT for the pt.  She stated that by getting PT in to see the pt they can hope to get a new hospital bed and wheelchair for the pt.  Called Trifha,LPN w/ [facility] today and informed her that Dr Beverely Low was given verbal orders for PT for the pt.//AB/CMA

## 2013-01-28 DIAGNOSIS — L03317 Cellulitis of buttock: Secondary | ICD-10-CM

## 2013-01-28 DIAGNOSIS — Z4800 Encounter for change or removal of nonsurgical wound dressing: Secondary | ICD-10-CM

## 2013-01-28 DIAGNOSIS — D518 Other vitamin B12 deficiency anemias: Secondary | ICD-10-CM

## 2013-01-28 DIAGNOSIS — F0390 Unspecified dementia without behavioral disturbance: Secondary | ICD-10-CM

## 2013-01-28 DIAGNOSIS — L0231 Cutaneous abscess of buttock: Secondary | ICD-10-CM

## 2013-01-28 DIAGNOSIS — M1990 Unspecified osteoarthritis, unspecified site: Secondary | ICD-10-CM

## 2013-01-29 ENCOUNTER — Telehealth: Payer: Self-pay | Admitting: *Deleted

## 2013-01-29 NOTE — Telephone Encounter (Signed)
Received call from [facility] requesting orders for new hospital bed with overlay mattress. According to previous phone note, MD gave verbal order for this to take place.   Okay for pt to have a new hospital bed and overlay mattress. Okay for wheelchair and PT services also.  Verbal Order: Dr. Neena Rhymes, [facility]

## 2013-02-11 ENCOUNTER — Telehealth: Payer: Self-pay | Admitting: *Deleted

## 2013-02-11 NOTE — Telephone Encounter (Signed)
Received call on Tues(02-09-13) from [facility] with [facility] requesting verbal orders for the pt.  Onalee Hua stated that he saw the pt on Mon(02-08-13) for PT and he is needing orders to see the pt 3x/wk for 1 week,then 2x/wk for 3 weeks,then 1x/wk for 2 weeks.  Also he states that the pt is having to have 2 person transfer b/c she is dead wt right now.   So he want to help the pt to get to a point where she can help transfer herself.  Halford Decamp and gave him verbal orders for the above PT per Dr. Deboraha Sprang

## 2013-03-06 IMAGING — CR DG CHEST 2V
2 series · 2 of 2 positions shown · non-contrast
Comparison: Two-view chest 11/06/2011.

CLINICAL DATA: Shortness of breath.  Bronchitis.  Recent cervical
spine fusion.

CHEST - 2 VIEW

[x chest ap]
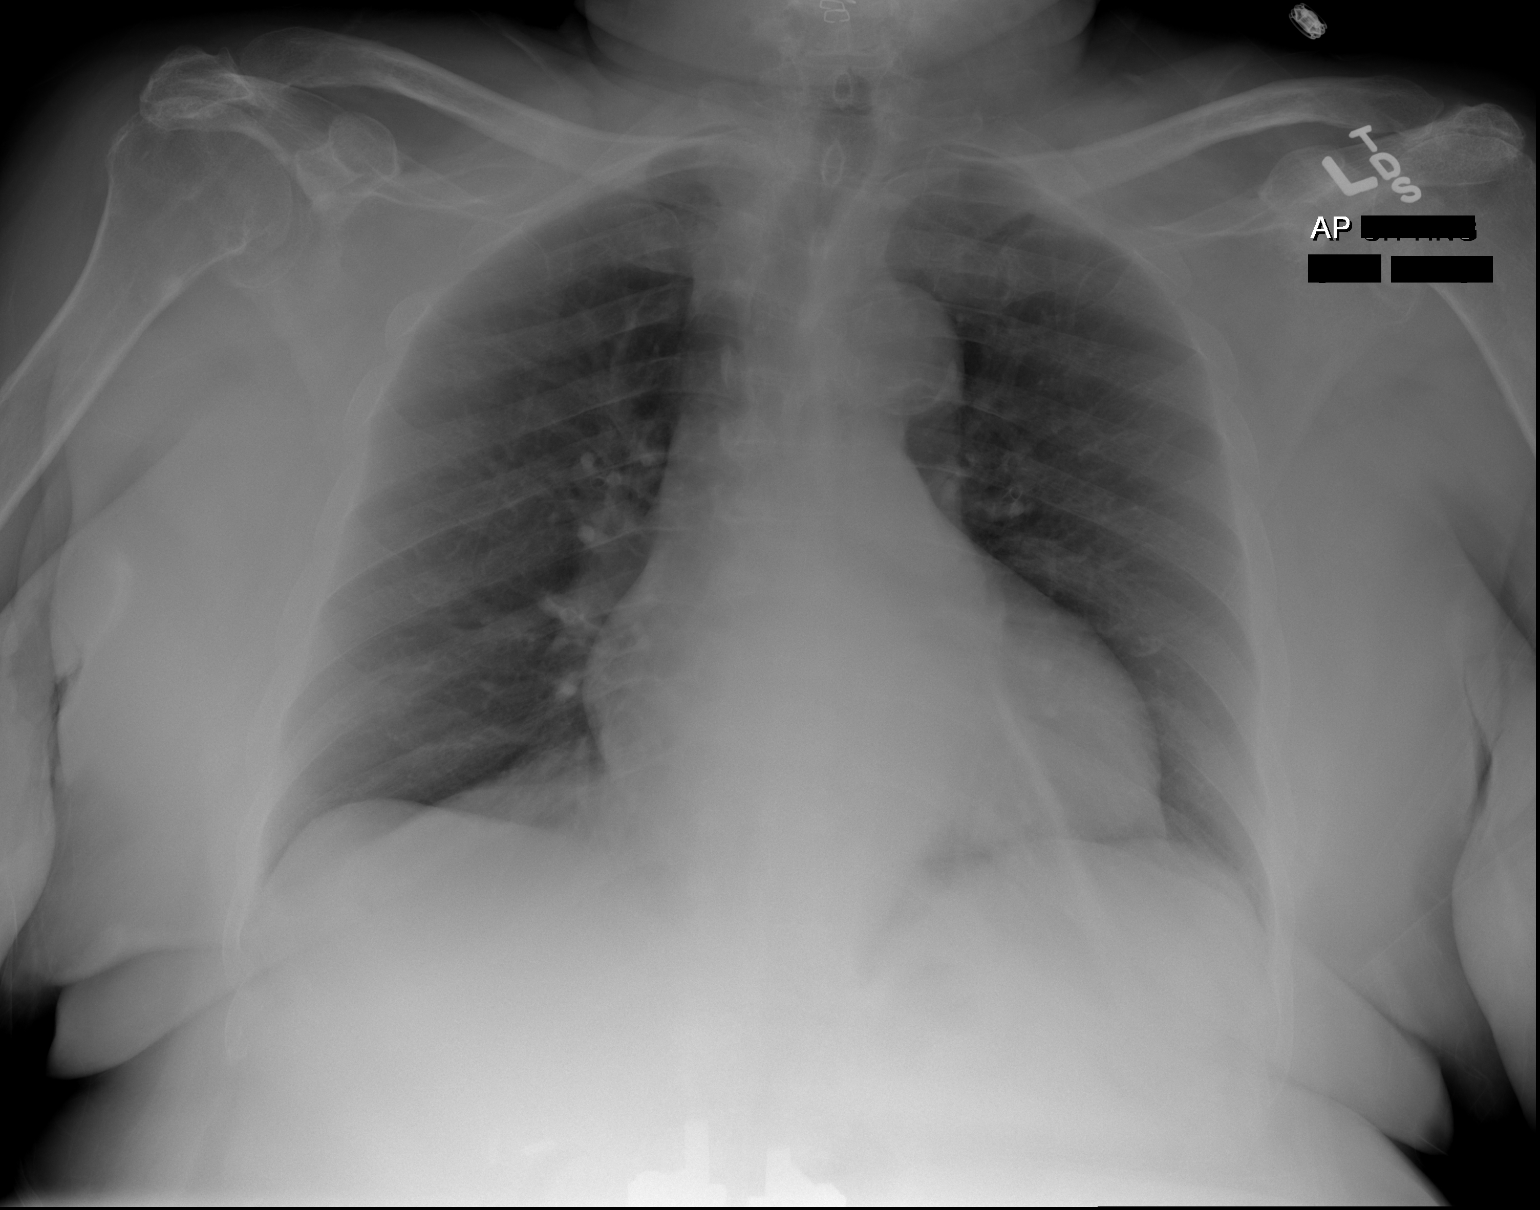

[w chest lat]
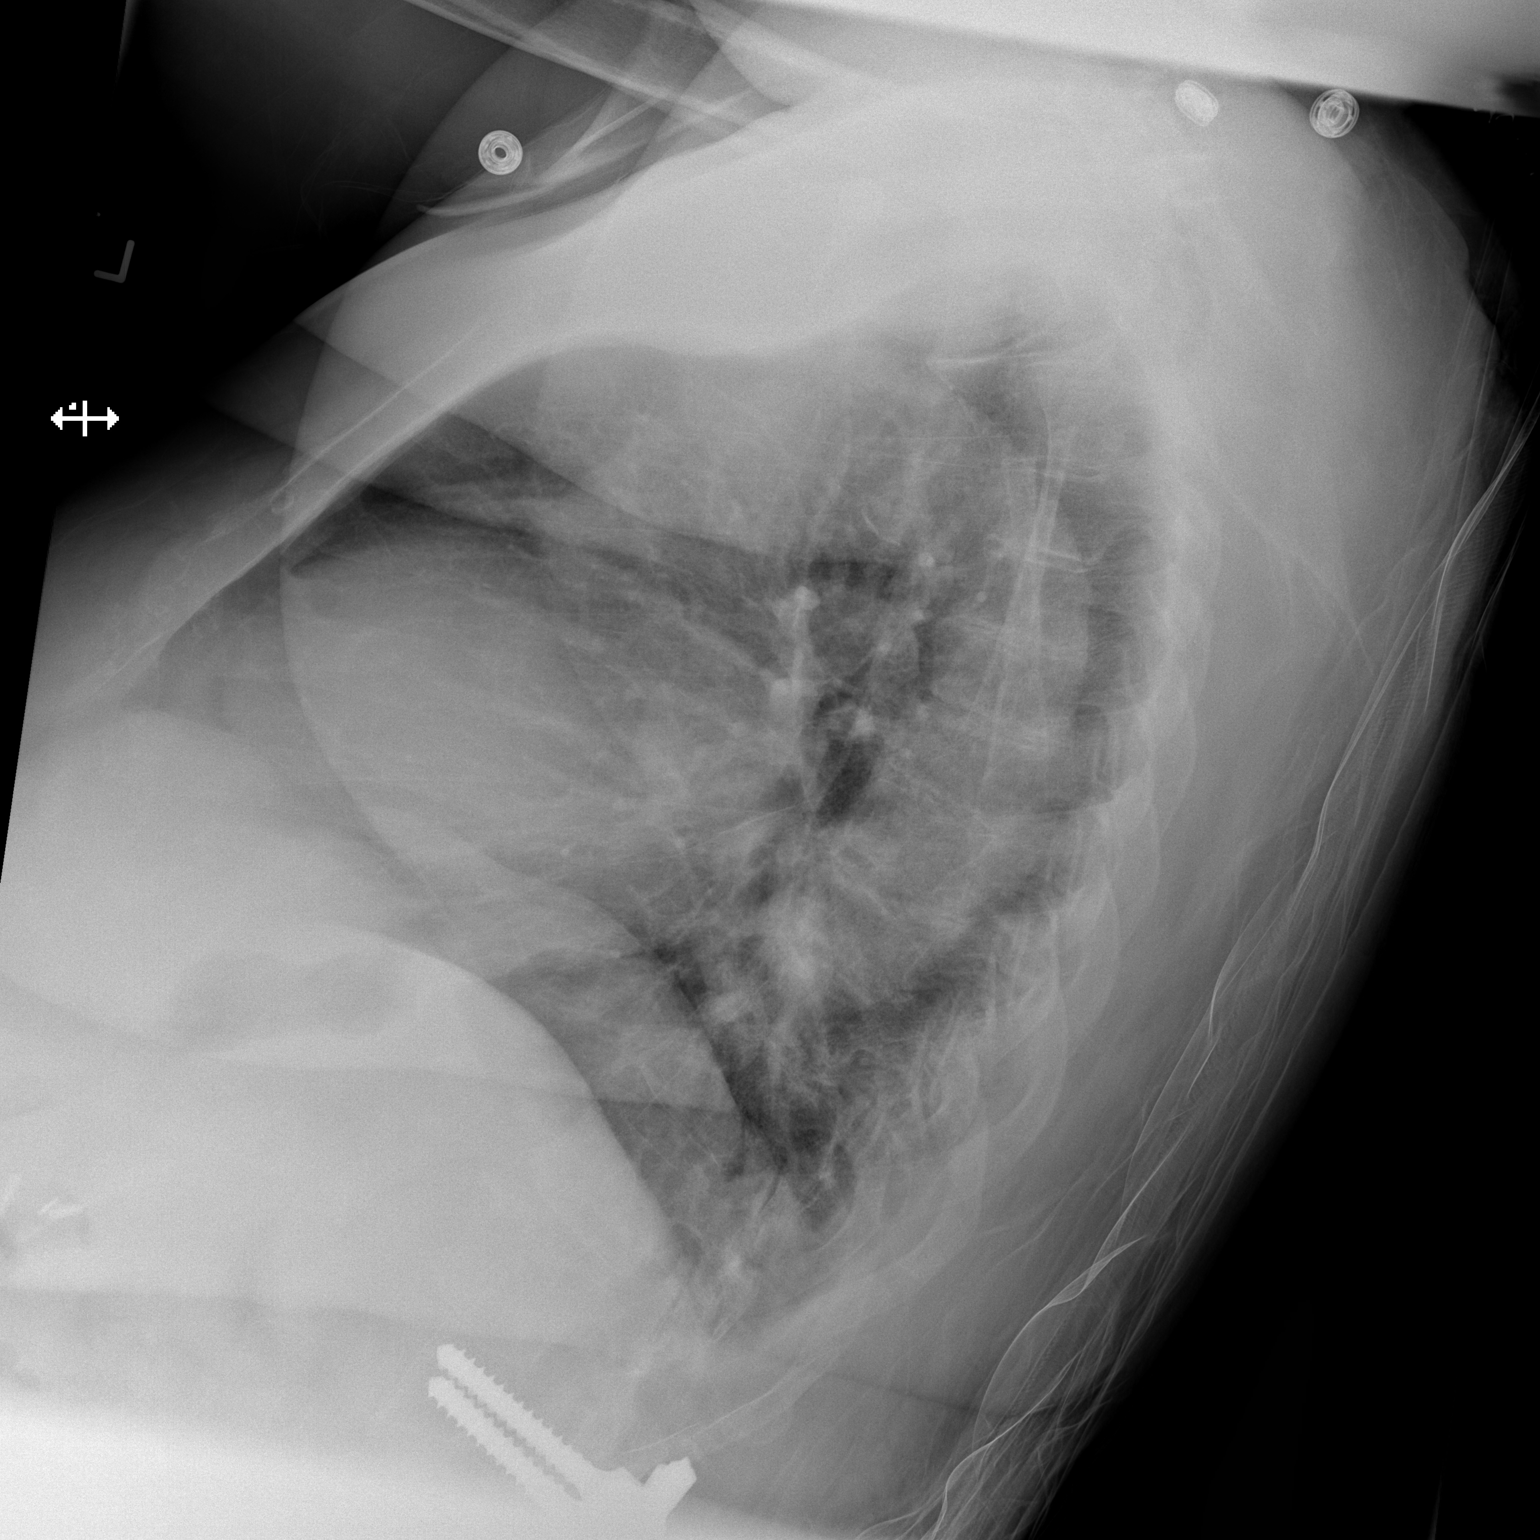

[2 of 2 positions shown; findings below may reference images not displayed]

FINDINGS: Lung volumes are low.  The heart size upper limits of
normal.  Linear atelectasis is present at the left lung base.  No
focal airspace disease is evident otherwise.  Degenerative changes
are noted in the thoracic spine.
IMPRESSION: 1.  Slight increase in lung volumes with new left basilar
atelectasis.
2.  Borderline cardiomegaly without failure.

## 2013-03-15 ENCOUNTER — Other Ambulatory Visit: Payer: Self-pay | Admitting: General Practice

## 2013-03-15 MED ORDER — HYDROCODONE-ACETAMINOPHEN 10-325 MG PO TABS: 1.0000 | ORAL_TABLET | Freq: Four times a day (QID) | ORAL | Status: DC | PRN

## 2013-03-31 ENCOUNTER — Other Ambulatory Visit: Payer: Self-pay | Admitting: General Practice

## 2013-03-31 ENCOUNTER — Telehealth: Payer: Self-pay | Admitting: General Practice

## 2013-03-31 MED ORDER — LORAZEPAM 0.5 MG PO TABS: 0.5000 mg | ORAL_TABLET | Freq: Three times a day (TID) | ORAL | Status: DC

## 2013-03-31 NOTE — Telephone Encounter (Signed)
Lorazepam refill  Last OV 01-13-13 Med filled 11-2012 #90 with 3 refills     Fax Rx to [facility] [place] [phone] (need hardscript with [employer])

## 2013-03-31 NOTE — Telephone Encounter (Signed)
Med filled and faxed.  

## 2013-03-31 NOTE — Telephone Encounter (Signed)
Ok to send #90, 3 refills to [facility] but won't send script to Nursing Home (they usually request script in addition to pharmacy)

## 2013-05-10 ENCOUNTER — Emergency Department (HOSPITAL_COMMUNITY)
Admission: EM | Admit: 2013-05-10 | Discharge: 2013-05-10 | Disposition: A | Discharge status: Skilled Nursing Facility | Payer: Medicare Other | Attending: Emergency Medicine | Admitting: Emergency Medicine

## 2013-05-10 ENCOUNTER — Emergency Department (HOSPITAL_COMMUNITY): Payer: Medicare Other

## 2013-05-10 ENCOUNTER — Encounter (HOSPITAL_COMMUNITY): Payer: Self-pay | Admitting: Emergency Medicine

## 2013-05-10 DIAGNOSIS — Z8709 Personal history of other diseases of the respiratory system: Secondary | ICD-10-CM | POA: Insufficient documentation

## 2013-05-10 DIAGNOSIS — S4980XA Other specified injuries of shoulder and upper arm, unspecified arm, initial encounter: Secondary | ICD-10-CM | POA: Insufficient documentation

## 2013-05-10 DIAGNOSIS — S139XXA Sprain of joints and ligaments of unspecified parts of neck, initial encounter: Secondary | ICD-10-CM | POA: Insufficient documentation

## 2013-05-10 DIAGNOSIS — IMO0002 Reserved for concepts with insufficient information to code with codable children: Secondary | ICD-10-CM | POA: Insufficient documentation

## 2013-05-10 DIAGNOSIS — F329 Major depressive disorder, single episode, unspecified: Secondary | ICD-10-CM | POA: Insufficient documentation

## 2013-05-10 DIAGNOSIS — F3289 Other specified depressive episodes: Secondary | ICD-10-CM | POA: Insufficient documentation

## 2013-05-10 DIAGNOSIS — D649 Anemia, unspecified: Secondary | ICD-10-CM | POA: Insufficient documentation

## 2013-05-10 DIAGNOSIS — W06XXXA Fall from bed, initial encounter: Secondary | ICD-10-CM | POA: Insufficient documentation

## 2013-05-10 DIAGNOSIS — B373 Candidiasis of vulva and vagina: Secondary | ICD-10-CM | POA: Insufficient documentation

## 2013-05-10 DIAGNOSIS — E039 Hypothyroidism, unspecified: Secondary | ICD-10-CM | POA: Insufficient documentation

## 2013-05-10 DIAGNOSIS — Z791 Long term (current) use of non-steroidal anti-inflammatories (NSAID): Secondary | ICD-10-CM | POA: Insufficient documentation

## 2013-05-10 DIAGNOSIS — Y9210 Unspecified residential institution as the place of occurrence of the external cause: Secondary | ICD-10-CM | POA: Insufficient documentation

## 2013-05-10 DIAGNOSIS — R011 Cardiac murmur, unspecified: Secondary | ICD-10-CM | POA: Insufficient documentation

## 2013-05-10 DIAGNOSIS — S46909A Unspecified injury of unspecified muscle, fascia and tendon at shoulder and upper arm level, unspecified arm, initial encounter: Secondary | ICD-10-CM | POA: Insufficient documentation

## 2013-05-10 DIAGNOSIS — G8929 Other chronic pain: Secondary | ICD-10-CM

## 2013-05-10 DIAGNOSIS — W19XXXA Unspecified fall, initial encounter: Secondary | ICD-10-CM

## 2013-05-10 DIAGNOSIS — S161XXA Strain of muscle, fascia and tendon at neck level, initial encounter: Secondary | ICD-10-CM

## 2013-05-10 DIAGNOSIS — B3731 Acute candidiasis of vulva and vagina: Secondary | ICD-10-CM | POA: Insufficient documentation

## 2013-05-10 DIAGNOSIS — Z87440 Personal history of urinary (tract) infections: Secondary | ICD-10-CM | POA: Insufficient documentation

## 2013-05-10 DIAGNOSIS — Z8711 Personal history of peptic ulcer disease: Secondary | ICD-10-CM | POA: Insufficient documentation

## 2013-05-10 DIAGNOSIS — Z8719 Personal history of other diseases of the digestive system: Secondary | ICD-10-CM | POA: Insufficient documentation

## 2013-05-10 DIAGNOSIS — E785 Hyperlipidemia, unspecified: Secondary | ICD-10-CM | POA: Insufficient documentation

## 2013-05-10 DIAGNOSIS — Y9389 Activity, other specified: Secondary | ICD-10-CM | POA: Insufficient documentation

## 2013-05-10 DIAGNOSIS — S0093XA Contusion of unspecified part of head, initial encounter: Secondary | ICD-10-CM

## 2013-05-10 DIAGNOSIS — Z79899 Other long term (current) drug therapy: Secondary | ICD-10-CM | POA: Insufficient documentation

## 2013-05-10 DIAGNOSIS — S0003XA Contusion of scalp, initial encounter: Secondary | ICD-10-CM | POA: Insufficient documentation

## 2013-05-10 MED ORDER — NYSTATIN 100000 UNIT/GM EX CREA: TOPICAL_CREAM | CUTANEOUS | Status: DC

## 2013-05-10 MED ORDER — HYDROCODONE-ACETAMINOPHEN 5-325 MG PO TABS
1.0000 | ORAL_TABLET | Freq: Once | ORAL | Status: AC
  Administered 2013-05-10: 1 via ORAL
  Filled 2013-05-10: qty 1

## 2013-05-10 NOTE — ED Provider Notes (Signed)
CSN: [id number]     Arrival date & time 05/10/13  1955 History   First MD Initiated Contact with Patient 05/10/13 2005     Chief Complaint  Patient presents with  . Fall  . head contusion   (Consider location/radiation/quality/duration/timing/severity/associated sxs/prior Treatment) HPI Christiann Hagerty is a 79 y.o. female who presents to emergency department after a mechanical fall at nursing home. Pt states she was in bed and dropped a fork. States went to try to pick it up, and fell out of the bed, hitting her head on the floor. Pt reports headache, neck pain, upper back pain, and chronic left shoulder pain that she has had for a month. Pt is non ambulatory at baseline. Denies LOC. Deniers new numbness or weakness of extremities. Denies blurred vision. No nausea, vomiting. Pt taking norco for chronic left shoulder pain and requesting one right now. Pt denies dizziess, chest pain, sob.     Past Medical History  Diagnosis Date  . Chicken pox   . Hyperlipidemia   . Depression   . Hypothyroid   . Bronchitis     hx of  . Anemia   . Blood transfusion     hx of  . Hepatitis     Hx of  . Urinary frequency   . Urinary tract infection     hx of  . Arthritis     "legs, neck"  . Ulcer, stomach peptic     hx of  . Heart murmur     patient's primary Dr. Cranston Neighbor   Past Surgical History  Procedure Laterality Date  . Back surgery      x3, fusion and rod  . Abdominal hysterectomy    . Knee arthroscopy  2007    right  . Abdominal surgery    . Tubal ligation    . Dilation and curettage of uterus    . Breast lumpectomy      right side  . Posterior cervical fusion/foraminotomy  11/13/2011    Procedure: POSTERIOR CERVICAL FUSION/FORAMINOTOMY LEVEL 1;  Surgeon: Carmela Hurt, MD;  Location: [facility];  Service: Neurosurgery;  Laterality: N/A;  Cervical one Cervical two posterior transpedicular fusion with lateral mass screws   Family History  Problem Relation Age of Onset  .  Arthritis    . Hyperlipidemia    . Hypertension     History  Substance Use Topics  . Smoking status: Never Smoker   . Smokeless tobacco: Not on file  . Alcohol Use: No   OB History   Grav Para Term Preterm Abortions TAB SAB Ect Mult Living                 Review of Systems  Constitutional: Negative for fever and chills.  Respiratory: Negative for cough, chest tightness and shortness of breath.   Cardiovascular: Negative for chest pain, palpitations and leg swelling.  Gastrointestinal: Negative for nausea, vomiting, abdominal pain and diarrhea.  Genitourinary: Negative for dysuria and flank pain.  Musculoskeletal: Positive for back pain and neck pain. Negative for neck stiffness.  Skin: Negative for rash.  Neurological: Positive for headaches. Negative for dizziness, weakness and light-headedness.  Psychiatric/Behavioral: Negative for confusion.  All other systems reviewed and are negative.    Allergies  Review of patient's allergies indicates no known allergies.  Home Medications   Current Outpatient Rx  Name  Route  Sig  Dispense  Refill  . albuterol (PROVENTIL HFA;VENTOLIN HFA) 108 (90 BASE) MCG/ACT inhaler   Inhalation  Inhale 2 puffs into the lungs every 4 (four) hours as needed for shortness of breath.          . benzonatate (TESSALON) 200 MG capsule   Oral   Take 200 mg by mouth 3 (three) times daily as needed for cough.         . citalopram (CELEXA) 10 MG tablet   Oral   Take 10 mg by mouth every morning.          . Cyanocobalamin (B-12) 1000 MCG SUBL   Sublingual   Place 1 tablet under the tongue daily.   30 each   2   . diclofenac sodium (VOLTAREN) 1 % GEL   Topical   Apply 2 g topically 4 (four) times daily. To each knee         . donepezil (ARICEPT) 10 MG tablet   Oral   Take 10 mg by mouth at bedtime.          . gabapentin (NEURONTIN) 300 MG capsule   Oral   Take 300 mg by mouth at bedtime.         . hydrochlorothiazide  (,MICROZIDE/HYDRODIURIL,) 12.5 MG capsule   Oral   Take 12.5 mg by mouth every morning.          Marland Kitchen HYDROcodone-acetaminophen (NORCO) 10-325 MG per tablet   Oral   Take 1 tablet by mouth every 6 (six) hours as needed for pain.   60 tablet   0   . levothyroxine (SYNTHROID) 137 MCG tablet   Oral   Take 1 tablet (137 mcg total) by mouth daily.   30 tablet   2   . loperamide (IMODIUM) 2 MG capsule   Oral   Take 2 mg by mouth 4 (four) times daily as needed for diarrhea or loose stools.         Marland Kitchen LORazepam (ATIVAN) 0.5 MG tablet   Oral   Take 1 tablet (0.5 mg total) by mouth 3 (three) times daily.   90 tablet   3   . methocarbamol (ROBAXIN) 750 MG tablet   Oral   Take 750 mg by mouth 3 (three) times daily. Muscle spasms.         . mometasone (NASONEX) 50 MCG/ACT nasal spray   Nasal   Place 2 sprays into the nose daily.         Marland Kitchen nystatin ointment (MYCOSTATIN)   Topical   Apply 1 application topically 2 (two) times daily.         Marland Kitchen omeprazole (PRILOSEC) 20 MG capsule   Oral   Take 20 mg by mouth daily.         . phenol (CHLORASEPTIC) 1.4 % LIQD   Mouth/Throat   Use as directed 2 sprays in the mouth or throat as needed (for sore throat).         . potassium chloride SA (K-DUR,KLOR-CON) 20 MEQ tablet   Oral   Take 20 mEq by mouth daily with breakfast. Do not  Crush. Take with food.         . rosuvastatin (CRESTOR) 20 MG tablet   Oral   Take 20 mg by mouth daily.          BP 129/77  Pulse 59  Temp(Src) 97.7 F (36.5 C) (Oral)  Resp 18  SpO2 93% Physical Exam  Nursing note and vitals reviewed. Constitutional: She is oriented to person, place, and time. She appears well-developed and well-nourished. No distress.  HENT:  Head: Normocephalic.  Right forehead hematoma. Skin abrasion to the hematoma. Hemostatic  Eyes: Conjunctivae and EOM are normal. Pupils are equal, round, and reactive to light.  Neck: Normal range of motion. Neck supple.   Cardiovascular: Normal rate, regular rhythm and normal heart sounds.   Pulmonary/Chest: Effort normal and breath sounds normal. No respiratory distress. She has no wheezes. She has no rales.  Abdominal: Soft. Bowel sounds are normal. She exhibits no distension. There is no tenderness. There is no rebound.  Genitourinary:  Perineal candida dermatitis  Musculoskeletal:  Tenderness over midline cervical and thoracic spine. No bruising, swelling, deformity. Full range of motion of the right arm at the shoulder and elbow joint. Unable to move left arm at the shoulder joint due to pain. No obvious deformity noted. Distal radial pulses are intact bilaterally. Full range of motion of bilateral hips, knees. Dorsal pedal pulses intact bilaterally  Neurological: She is alert and oriented to person, place, and time. No cranial nerve deficit. Coordination normal.  Skin: Skin is warm and dry.  Psychiatric: She has a normal mood and affect. Her behavior is normal.    ED Course  Procedures (including critical care time) Labs Review Labs Reviewed - No data to display Imaging Review Dg Thoracic Spine 2 View  05/10/2013   CLINICAL DATA:  Larey Seat today. Upper back pain.  EXAM: THORACIC SPINE - 2 VIEW  COMPARISON:  Lateral chest radiograph, 11/17/2011.  FINDINGS: No fracture. No spondylolisthesis.  There are disk degenerative changes with mild loss of disk height, small endplate osteophytes and endplate sclerosis which is most evident in the mid to lower thoracic spine. The bones are diffusely demineralized. There are changes from a C1-C2 posterior fusion and a lumbar spine posterior fusion.  The soft tissues are unremarkable.  IMPRESSION: No fracture or acute finding.   Electronically Signed   By: Amie Portland M.D.   On: 05/10/2013 21:10   Ct Head Wo Contrast  05/10/2013   CLINICAL DATA:  Head trauma with forehead hematoma secondary to a fall.  EXAM: CT HEAD WITHOUT CONTRAST  TECHNIQUE: Contiguous axial images were  obtained from the base of the skull through the vertex without intravenous contrast.  COMPARISON:  08/20/2012  FINDINGS: There is a scalp hematoma over the right side of the frontal bone. No skull fracture or other acute osseous abnormality.  There is no acute intracranial hemorrhage, infarction, or mass lesion. There is diffuse atrophy with stable ventriculomegaly and stable extensive periventricular white matter lucency consistent with chronic small vessel ischemic disease.  IMPRESSION: No acute intracranial abnormality. Prominent right frontal scalp hematoma.   Electronically Signed   By: Geanie Cooley M.D.   On: 05/10/2013 21:15   Ct Cervical Spine Wo Contrast  05/10/2013   CLINICAL DATA:  Head trauma secondary to a fall today. Previous cervical spine fracture.  EXAM: CT CERVICAL SPINE WITHOUT CONTRAST  TECHNIQUE: Multidetector CT imaging of the cervical spine was performed without intravenous contrast. Multiplanar CT image reconstructions were also generated.  COMPARISON:  08/20/2012  FINDINGS: C1 and C2 are surgically fused. The fractures have healed. There is diffuse degenerative disc disease throughout the cervical spine. No fracture or subluxation or prevertebral soft tissue swelling. No significant change since the prior exam. Degenerative facet arthritis, most prominent at C2-3 and C3-4 on the left.  IMPRESSION: No acute abnormalities of the cervical spine.   Electronically Signed   By: Geanie Cooley M.D.   On: 05/10/2013 21:19   Dg Shoulder Left  05/10/2013   CLINICAL DATA:  Larey Seat today. Left shoulder pain.  EXAM: LEFT SHOULDER - 2+ VIEW  COMPARISON:  None.  FINDINGS: No fracture or dislocation.  There is marked narrowing of the subacromial space consistent with a chronic full-thickness rotator cuff tear. Marginal osteophytes extend along the base of the femoral head. There is mild narrowing of the glenohumeral joint space. The [facility] joint demonstrates mild osteoarthritis. The underlying ribs are intact.  The bones are diffusely demineralized. The soft tissues are unremarkable.  IMPRESSION: No fracture or dislocation.  Chronic full-thickness rotator cuff tear. Degenerative changes as described.   Electronically Signed   By: Amie Portland M.D.   On: 05/10/2013 21:12    EKG Interpretation   None       MDM   1. Fall, initial encounter   2. Traumatic hematoma of head, initial encounter   3. Cervical strain, initial encounter   4. Chronic left shoulder pain   5. Candidal vulvovaginitis       Bonita Quin a mechanical fall out of her bed. She is much hematoma to the forehead and pain in her neck and upper back. No other findings on exam other than chronic left shoulder pain. All x-rays and CT of the head and cervical spine are negative. She's not on any blood thinners. She is acting at her baseline according to her daughter. I have given her Norco for pain. She will continue pain medications at assisted living facility for her headache and her shoulder. She will follow up with her primary care doctor.   Filed Vitals:   05/10/13 1958  BP: 129/77  Pulse: 59  Temp: 97.7 F (36.5 C)  TempSrc: Oral  Resp: 18  SpO2: 93%     Dyneshia Baccam A Jermari Tamargo, PA-C 05/10/13 2332

## 2013-05-10 NOTE — ED Notes (Signed)
Pt arrives by EMS from [facility]-pt attempted to get out of bed by herself and fell to the floor and has abrasion/contusion to right upper forehead-no LOC/confused which is normal for pt

## 2013-05-10 NOTE — Progress Notes (Signed)
CSW met at bedside with pt.  Pt was oriented to self.  Pt reported that she lived in a "convalescent home" was unable to remember the name.  Pt was not able to remember the year, but was able report that she was born in 1934 and that she knew that the "time fell back one hour today".  Pt was able to tell CSW that she came to the hospital because she fell.  Pt reports that she has a daughter Celso Amy who is her HCPOA.  Pt reported that she expected her daughter to come tonight and that CSW could talk with her then.  No family was at bedside to consult.  CSW confirmed in pts chart that she is a current resident of [facility].  Marva Panda, LCSWA  [phone] .05/10/2013  8:45 pm

## 2013-05-10 NOTE — ED Notes (Signed)
Pt transported to xray 

## 2013-05-10 NOTE — ED Provider Notes (Signed)
Medical screening examination/treatment/procedure(s) were conducted as a shared visit with non-physician practitioner(s) and myself.  I personally evaluated the patient during the encounter.  EKG Interpretation   None      Patient with head contusion is noted. X-rays without acute injury. Stable for discharge  Toy Baker, MD 05/10/13 2219

## 2013-05-10 NOTE — ED Notes (Signed)
Bed: WA15 Expected date:  Expected time:  Means of arrival:  Comments: EMS-fall 

## 2013-05-10 NOTE — ED Notes (Signed)
Report given to Velna Hatchet at [facility] and EMS called for transportation back to facility

## 2013-05-10 NOTE — ED Notes (Signed)
Pt fell while trying to get out of her bed, she has an abrasion ans hematoma on the right side of her forehead, no complaints of new pain, pt complains of right shoulder pain that was hurting prior to her fall.

## 2013-05-11 NOTE — ED Provider Notes (Signed)
Medical screening examination/treatment/procedure(s) were performed by non-physician practitioner and as supervising physician I was immediately available for consultation/collaboration.  Gelsey Amyx T Ceil Roderick, MD 05/11/13 2326 

## 2013-05-17 ENCOUNTER — Telehealth: Payer: Self-pay | Admitting: General Practice

## 2013-05-17 IMAGING — CT CT CERVICAL SPINE W/O CM
3 of 8 series · 8 of 35 positions shown, 10 images · non-contrast
Comparison: 11/12/2011.  01/06/2012.

CLINICAL DATA: Arthritis.  Right-sided head pain.  Neck pain.
Inability to walk.  C1-C2 spinal fixation.

CT CERVICAL SPINE WITHOUT CONTRAST
TECHNIQUE: Multidetector CT imaging of the cervical spine was
performed. Multiplanar CT image reconstructions were also
generated.

[Series 103: cor lower · coronal · 0.30mm/px · 1 of 35 slices shown]
[im 18/35  bone]
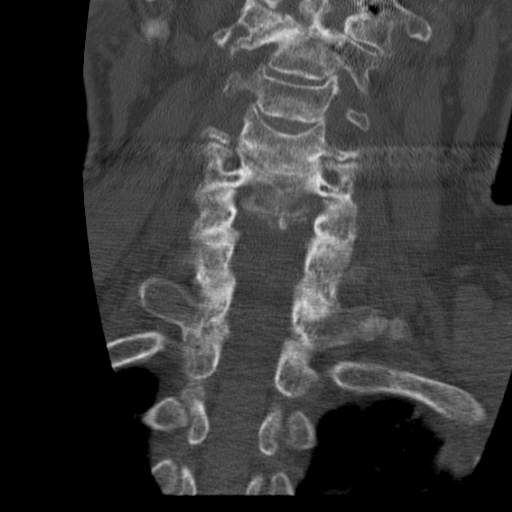

[Series 104: sag · sagittal · 0.30mm/px · 5 of 39 slices shown, 6 images]
[im 13/39  bone]
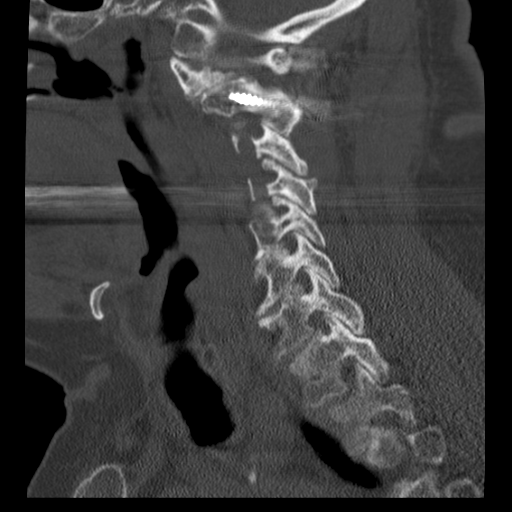
[im 16/39  bone]
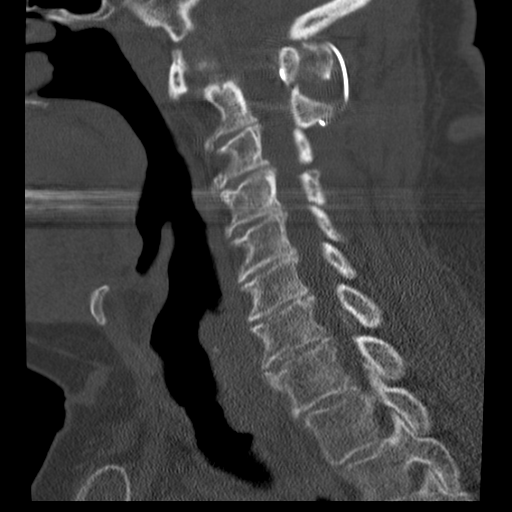
[im 20/39  soft-tissue]
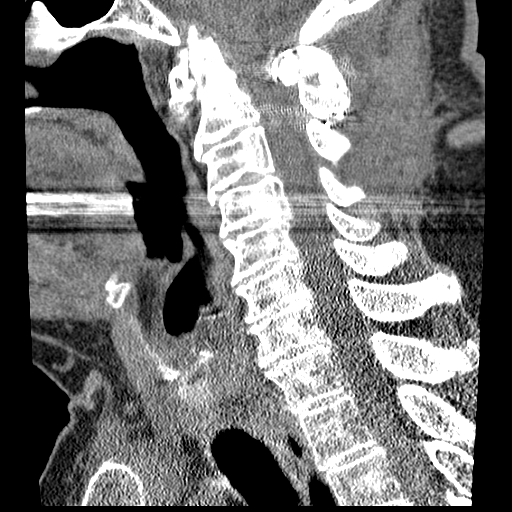
[im 20/39  bone]
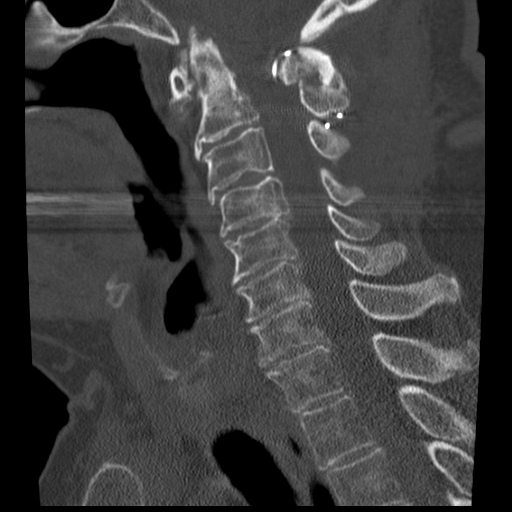
[im 23/39  bone]
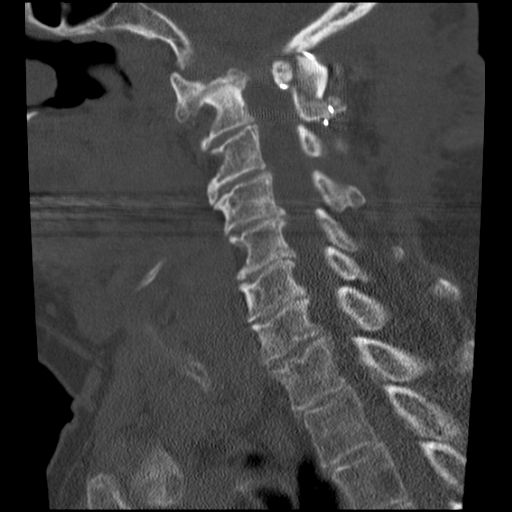
[im 26/39  bone]
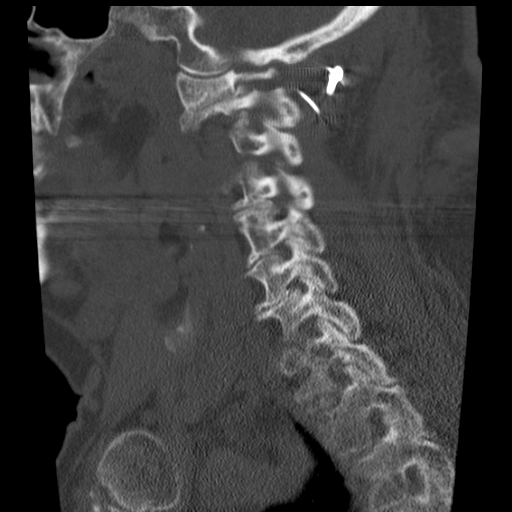

[Series 106: axial c-sp · axial · 0.30mm/px · z∈[-176,-119]mm · 2 of 95 slices shown, 3 images]
[im 32/95  soft-tissue]
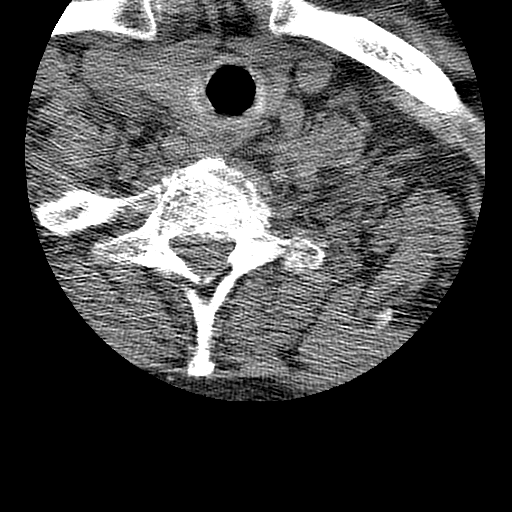
[im 32/95  bone]
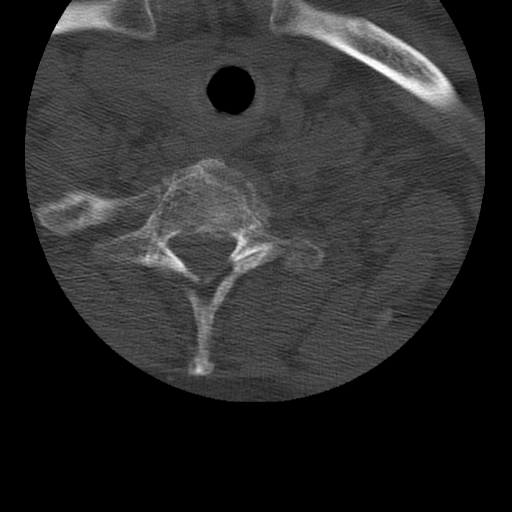
[im 63/95  bone]
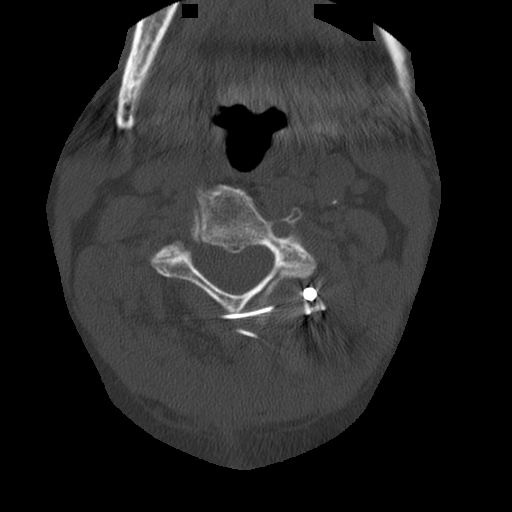

[8 of 35 positions shown; findings below may reference images not displayed]

FINDINGS: Straightening of the normal cervical lordosis.  Unchanged
cerclage wires around the posterior elements of C1 and C2.  Rod and
screw fixation of the lateral masses of C1-C2 appear unchanged
compared to prior.  There is no arthrodesis across C1-C2 at this
time.  Stable asymmetry of the atlantodental space.

Severe degenerative changes of the right C1-C2 articulation are
again noted, without interval change from recent prior 01/17/2012.

Multilevel degenerative changes at other levels appears similar.
There is no cervical spine fracture or dislocation.  [place]-
occipital alignment appears within normal limits. On the sagittal
images, a bone graft is present posterior to the arches of C1 and
C2, retained by cerclage wires.
IMPRESSION: Postoperative changes of C1-C2 fixation with posterior rod and
screw fixation on the left and posterior element cerclage wires.
There is no fusion at this time. Multilevel degenerative changes at
other levels appears similar.  No change in spinal alignment.

## 2013-05-17 NOTE — Telephone Encounter (Signed)
Called pt granddaughter in regards to pt and LMOVM to return call.    We have been receiving several notes lately from [facility] in regards to pt's skin condition. It is becoming very hard to treat without being able to physically see the pt. Dr. Beverely Low recommends to have pt being to see Dr. Smith Mince who is able to see pt in nursing home without transporting her to the office.

## 2013-05-18 ENCOUNTER — Other Ambulatory Visit: Payer: Self-pay | Admitting: General Practice

## 2013-05-18 MED ORDER — HYDROCODONE-ACETAMINOPHEN 10-325 MG PO TABS: 1.0000 | ORAL_TABLET | Freq: Four times a day (QID) | ORAL | Status: DC | PRN

## 2013-05-18 NOTE — Telephone Encounter (Signed)
Tried pt's family again today. LMOVM to return call.

## 2013-05-20 NOTE — Telephone Encounter (Signed)
Can never reach pt's family. Letter mailed.

## 2013-08-02 ENCOUNTER — Telehealth: Payer: Self-pay | Admitting: *Deleted

## 2013-08-02 MED ORDER — LORAZEPAM 0.5 MG PO TABS: 0.5000 mg | ORAL_TABLET | Freq: Three times a day (TID) | ORAL | Status: DC

## 2013-08-02 NOTE — Telephone Encounter (Signed)
Received call from [facility] requesting refill on pts Ativan. If approved can fax to # [phone].  LAst OV 01/13/13 Med last filled 03/31/13 #90 with 3RF Okay to refill?

## 2013-08-02 NOTE — Telephone Encounter (Signed)
Ok for #90, 3 refills 

## 2013-08-02 NOTE — Telephone Encounter (Signed)
Med filled and fax[facility] to [facility].

## 2013-09-08 ENCOUNTER — Telehealth: Payer: Self-pay | Admitting: General Practice

## 2013-09-08 MED ORDER — HYDROCODONE-ACETAMINOPHEN 10-325 MG PO TABS: 1.0000 | ORAL_TABLET | Freq: Four times a day (QID) | ORAL | Status: DC | PRN

## 2013-09-08 NOTE — Telephone Encounter (Signed)
Med filled.  

## 2013-09-08 NOTE — Telephone Encounter (Signed)
Last OV 01-13-13 Hydrocodone filled 05-18-13 #60 with 0

## 2013-09-08 NOTE — Telephone Encounter (Signed)
Ok for #60 

## 2013-09-27 ENCOUNTER — Telehealth: Payer: Self-pay | Admitting: Family Medicine

## 2013-09-27 NOTE — Telephone Encounter (Signed)
Fax placed in CMA's yellow folder.

## 2013-09-27 NOTE — Telephone Encounter (Signed)
[facility] pharmacy tech called and stated that patient's insurance did not cover mometasone (NASONEX) 50 MCG/ACT nasal spray but they are going to send a fax over with a medication that her insurance does cover.

## 2013-09-27 NOTE — Telephone Encounter (Signed)
Noted will wait for fax

## 2013-12-09 ENCOUNTER — Emergency Department (HOSPITAL_COMMUNITY)
Admission: EM | Admit: 2013-12-09 | Discharge: 2013-12-09 | Disposition: A | Discharge status: Home / Self Care | Payer: Medicare Other | Attending: Emergency Medicine | Admitting: Emergency Medicine

## 2013-12-09 ENCOUNTER — Encounter (HOSPITAL_COMMUNITY): Payer: Self-pay | Admitting: Emergency Medicine

## 2013-12-09 ENCOUNTER — Emergency Department (HOSPITAL_COMMUNITY): Payer: Medicare Other

## 2013-12-09 ENCOUNTER — Ambulatory Visit: Payer: Medicare Other | Admitting: Family Medicine

## 2013-12-09 DIAGNOSIS — Z8619 Personal history of other infectious and parasitic diseases: Secondary | ICD-10-CM | POA: Insufficient documentation

## 2013-12-09 DIAGNOSIS — R011 Cardiac murmur, unspecified: Secondary | ICD-10-CM | POA: Insufficient documentation

## 2013-12-09 DIAGNOSIS — F3289 Other specified depressive episodes: Secondary | ICD-10-CM | POA: Insufficient documentation

## 2013-12-09 DIAGNOSIS — M129 Arthropathy, unspecified: Secondary | ICD-10-CM | POA: Insufficient documentation

## 2013-12-09 DIAGNOSIS — Z862 Personal history of diseases of the blood and blood-forming organs and certain disorders involving the immune mechanism: Secondary | ICD-10-CM | POA: Insufficient documentation

## 2013-12-09 DIAGNOSIS — N390 Urinary tract infection, site not specified: Secondary | ICD-10-CM | POA: Insufficient documentation

## 2013-12-09 DIAGNOSIS — J069 Acute upper respiratory infection, unspecified: Secondary | ICD-10-CM | POA: Insufficient documentation

## 2013-12-09 DIAGNOSIS — J029 Acute pharyngitis, unspecified: Secondary | ICD-10-CM | POA: Insufficient documentation

## 2013-12-09 DIAGNOSIS — F329 Major depressive disorder, single episode, unspecified: Secondary | ICD-10-CM | POA: Insufficient documentation

## 2013-12-09 DIAGNOSIS — E039 Hypothyroidism, unspecified: Secondary | ICD-10-CM | POA: Insufficient documentation

## 2013-12-09 DIAGNOSIS — Z79899 Other long term (current) drug therapy: Secondary | ICD-10-CM | POA: Insufficient documentation

## 2013-12-09 DIAGNOSIS — E785 Hyperlipidemia, unspecified: Secondary | ICD-10-CM | POA: Insufficient documentation

## 2013-12-09 DIAGNOSIS — Z8719 Personal history of other diseases of the digestive system: Secondary | ICD-10-CM | POA: Insufficient documentation

## 2013-12-09 LAB — URINALYSIS, ROUTINE W REFLEX MICROSCOPIC
BILIRUBIN URINE: NEGATIVE
Glucose, UA: NEGATIVE mg/dL
HGB URINE DIPSTICK: NEGATIVE
Ketones, ur: NEGATIVE mg/dL
NITRITE: POSITIVE — AB
PH: 6 (ref 5.0–8.0)
Protein, ur: NEGATIVE mg/dL
Specific Gravity, Urine: 1.02 (ref 1.005–1.030)
Urobilinogen, UA: >8 mg/dL — ABNORMAL HIGH (ref 0.0–1.0)

## 2013-12-09 LAB — BASIC METABOLIC PANEL
BUN: 9 mg/dL (ref 6–23)
CALCIUM: 9.9 mg/dL (ref 8.4–10.5)
CO2: 25 meq/L (ref 19–32)
CREATININE: 0.61 mg/dL (ref 0.50–1.10)
Chloride: 102 mEq/L (ref 96–112)
GFR calc Af Amer: >90 mL/min (ref >90)
GFR calc non Af Amer: 85 mL/min — ABNORMAL LOW (ref >90)
Glucose, Bld: 97 mg/dL (ref 70–99)
Potassium: 4.2 mEq/L (ref 3.7–5.3)
Sodium: 140 mEq/L (ref 137–147)

## 2013-12-09 LAB — CBC WITH DIFFERENTIAL/PLATELET
Basophils Absolute: 0 10*3/uL (ref 0.0–0.1)
Basophils Relative: 0 % (ref 0–1)
EOS PCT: 2 % (ref 0–5)
Eosinophils Absolute: 0.1 10*3/uL (ref 0.0–0.7)
HEMATOCRIT: 41 % (ref 36.0–46.0)
HEMOGLOBIN: 14 g/dL (ref 12.0–15.0)
Lymphocytes Relative: 43 % (ref 12–46)
Lymphs Abs: 2.1 10*3/uL (ref 0.7–4.0)
MCH: 32.5 pg (ref 26.0–34.0)
MCHC: 34.1 g/dL (ref 30.0–36.0)
MCV: 95.1 fL (ref 78.0–100.0)
MONO ABS: 0.4 10*3/uL (ref 0.1–1.0)
MONOS PCT: 9 % (ref 3–12)
NEUTROS ABS: 2.2 10*3/uL (ref 1.7–7.7)
Neutrophils Relative %: 46 % (ref 43–77)
Platelets: 273 10*3/uL (ref 150–400)
RBC: 4.31 MIL/uL (ref 3.87–5.11)
RDW: 12.5 % (ref 11.5–15.5)
WBC: 4.8 10*3/uL (ref 4.0–10.5)

## 2013-12-09 LAB — RAPID STREP SCREEN (MED CTR MEBANE ONLY): Streptococcus, Group A Screen (Direct): NEGATIVE

## 2013-12-09 LAB — URINE MICROSCOPIC-ADD ON

## 2013-12-09 LAB — TROPONIN I: Troponin I: <0.3 ng/mL (ref <0.30)

## 2013-12-09 MED ORDER — ZINC OXIDE 20 % EX OINT: 1.0000 "application " | TOPICAL_OINTMENT | Freq: Every day | CUTANEOUS | Status: DC

## 2013-12-09 MED ORDER — ACETAMINOPHEN 500 MG PO TABS: 500.0000 mg | ORAL_TABLET | Freq: Four times a day (QID) | ORAL | Status: DC | PRN

## 2013-12-09 MED ORDER — SODIUM CHLORIDE 0.9 % IV BOLUS (SEPSIS)
1000.0000 mL | Freq: Once | INTRAVENOUS | Status: AC
  Administered 2013-12-09: 1000 mL via INTRAVENOUS

## 2013-12-09 MED ORDER — ACETAMINOPHEN 500 MG PO TABS
1000.0000 mg | ORAL_TABLET | Freq: Once | ORAL | Status: AC
  Administered 2013-12-09: 1000 mg via ORAL
  Filled 2013-12-09: qty 2

## 2013-12-09 MED ORDER — CEPHALEXIN 500 MG PO CAPS: 500.0000 mg | ORAL_CAPSULE | Freq: Four times a day (QID) | ORAL | Status: DC

## 2013-12-09 MED ORDER — DEXTROSE 5 % IV SOLN
1.0000 g | Freq: Once | INTRAVENOUS | Status: AC
  Administered 2013-12-09: 1 g via INTRAVENOUS
  Filled 2013-12-09: qty 10

## 2013-12-09 NOTE — ED Provider Notes (Signed)
CSN: [id number]     Arrival date & time 12/09/13  1406 History   First MD Initiated Contact with Patient 12/09/13 1406     Chief Complaint  Patient presents with  . Cough     (Consider location/radiation/quality/duration/timing/severity/associated sxs/prior Treatment) HPI Comments: Patient presents to the emergency department with chief complaint of cough. She states that she has had a cough for the past several weeks. She also complains of sore throat. She any fevers, chills, nausea, vomiting, diarrhea, or dysuria. She has or urinary frequency. She is tried taking throat lozenges for cough with mild relief. She states that when she coughs, it causes her chest hurt. Otherwise she denies any chest pain or shortness of breath. She is accompanied by her granddaughter.  The history is provided by the patient. No language interpreter was used.    Past Medical History  Diagnosis Date  . Chicken pox   . Hyperlipidemia   . Depression   . Hypothyroid   . Bronchitis     hx of  . Anemia   . Blood transfusion     hx of  . Hepatitis     Hx of  . Urinary frequency   . Urinary tract infection     hx of  . Arthritis     "legs, neck"  . Ulcer, stomach peptic     hx of  . Heart murmur     patient's primary Dr. Cranston Neighbor   Past Surgical History  Procedure Laterality Date  . Back surgery      x3, fusion and rod  . Abdominal hysterectomy    . Knee arthroscopy  2007    right  . Abdominal surgery    . Tubal ligation    . Dilation and curettage of uterus    . Breast lumpectomy      right side  . Posterior cervical fusion/foraminotomy  11/13/2011    Procedure: POSTERIOR CERVICAL FUSION/FORAMINOTOMY LEVEL 1;  Surgeon: Carmela Hurt, MD;  Location: [facility];  Service: Neurosurgery;  Laterality: N/A;  Cervical one Cervical two posterior transpedicular fusion with lateral mass screws   Family History  Problem Relation Age of Onset  . Arthritis    . Hyperlipidemia    . Hypertension      History  Substance Use Topics  . Smoking status: Never Smoker   . Smokeless tobacco: Not on file  . Alcohol Use: No   OB History   Grav Para Term Preterm Abortions TAB SAB Ect Mult Living                 Review of Systems  Constitutional: Negative for fever and chills.  HENT: Positive for sore throat.   Respiratory: Positive for cough. Negative for shortness of breath.   Cardiovascular: Negative for chest pain.  Gastrointestinal: Negative for nausea, vomiting, diarrhea and constipation.  Genitourinary: Negative for dysuria.      Allergies  Review of patient's allergies indicates no known allergies.  Home Medications   Prior to Admission medications   Medication Sig Start Date End Date Taking? Authorizing Provider  albuterol (PROVENTIL HFA;VENTOLIN HFA) 108 (90 BASE) MCG/ACT inhaler Inhale 2 puffs into the lungs every 4 (four) hours as needed for shortness of breath.     Historical Provider, MD  benzonatate (TESSALON) 200 MG capsule Take 200 mg by mouth 3 (three) times daily as needed for cough.    Historical Provider, MD  citalopram (CELEXA) 10 MG tablet Take 10 mg by mouth every  morning.     Historical Provider, MD  Cyanocobalamin (B-12) 1000 MCG SUBL Place 1 tablet under the tongue daily. 05/26/12   Sheliah HatchKatherine E Tabori, MD  diclofenac sodium (VOLTAREN) 1 % GEL Apply 2 g topically 4 (four) times daily. To each knee    Historical Provider, MD  donepezil (ARICEPT) 10 MG tablet Take 10 mg by mouth at bedtime.     Historical Provider, MD  gabapentin (NEURONTIN) 300 MG capsule Take 300 mg by mouth at bedtime.    Historical Provider, MD  hydrochlorothiazide (,MICROZIDE/HYDRODIURIL,) 12.5 MG capsule Take 12.5 mg by mouth every morning.     Historical Provider, MD  HYDROcodone-acetaminophen (NORCO) 10-325 MG per tablet Take 1 tablet by mouth every 6 (six) hours as needed. 09/08/13   Sheliah HatchKatherine E Tabori, MD  levothyroxine (SYNTHROID) 137 MCG tablet Take 1 tablet (137 mcg total) by mouth  daily. 09/02/12   Sheliah HatchKatherine E Tabori, MD  loperamide (IMODIUM) 2 MG capsule Take 2 mg by mouth 4 (four) times daily as needed for diarrhea or loose stools.    Historical Provider, MD  LORazepam (ATIVAN) 0.5 MG tablet Take 1 tablet (0.5 mg total) by mouth 3 (three) times daily. 08/02/13   Sheliah HatchKatherine E Tabori, MD  methocarbamol (ROBAXIN) 750 MG tablet Take 750 mg by mouth 3 (three) times daily. Muscle spasms.    Historical Provider, MD  mometasone (NASONEX) 50 MCG/ACT nasal spray Place 2 sprays into the nose daily.    Historical Provider, MD  nystatin cream (MYCOSTATIN) Apply to affected area 2 times daily 05/10/13   Tatyana A Kirichenko, PA-C  nystatin ointment (MYCOSTATIN) Apply 1 application topically 2 (two) times daily.    Historical Provider, MD  omeprazole (PRILOSEC) 20 MG capsule Take 20 mg by mouth daily.    Historical Provider, MD  phenol (CHLORASEPTIC) 1.4 % LIQD Use as directed 2 sprays in the mouth or throat as needed (for sore throat).    Historical Provider, MD  potassium chloride SA (K-DUR,KLOR-CON) 20 MEQ tablet Take 20 mEq by mouth daily with breakfast. Do not  Crush. Take with food.    Historical Provider, MD  rosuvastatin (CRESTOR) 20 MG tablet Take 20 mg by mouth daily.    Historical Provider, MD   BP 136/69  Pulse 50  Temp(Src) 97.1 F (36.2 C) (Oral)  Resp 15  SpO2 99% Physical Exam  Nursing note and vitals reviewed. Constitutional: She is oriented to person, place, and time. She appears well-developed and well-nourished.  HENT:  Head: Normocephalic and atraumatic.  Oropharynx is clear, no tonsillar exudates, no abscess  Eyes: Conjunctivae and EOM are normal. Pupils are equal, round, and reactive to light.  Neck: Normal range of motion. Neck supple.  Cardiovascular: Normal rate and regular rhythm.  Exam reveals no gallop and no friction rub.   No murmur heard. Pulmonary/Chest: Effort normal and breath sounds normal. No respiratory distress. She has no wheezes. She has no  rales. She exhibits no tenderness.  Lungs are clear to auscultation  Abdominal: Soft. Bowel sounds are normal. She exhibits no distension and no mass. There is no tenderness. There is no rebound and no guarding.  No abdominal tenderness  Musculoskeletal: Normal range of motion. She exhibits no edema and no tenderness.  Neurological: She is alert and oriented to person, place, and time.  Skin: Skin is warm and dry.  Psychiatric: She has a normal mood and affect. Her behavior is normal. Judgment and thought content normal.    ED Course  Procedures (  including critical care time) Results for orders placed during the hospital encounter of 12/09/13  CBC WITH DIFFERENTIAL      Result Value Ref Range   WBC 4.8  4.0 - 10.5 K/uL   RBC 4.31  3.87 - 5.11 MIL/uL   Hemoglobin 14.0  12.0 - 15.0 g/dL   HCT 78.6  76.7 - 20.9 %   MCV 95.1  78.0 - 100.0 fL   MCH 32.5  26.0 - 34.0 pg   MCHC 34.1  30.0 - 36.0 g/dL   RDW 47.0  96.2 - 83.6 %   Platelets 273  150 - 400 K/uL   Neutrophils Relative % 46  43 - 77 %   Neutro Abs 2.2  1.7 - 7.7 K/uL   Lymphocytes Relative 43  12 - 46 %   Lymphs Abs 2.1  0.7 - 4.0 K/uL   Monocytes Relative 9  3 - 12 %   Monocytes Absolute 0.4  0.1 - 1.0 K/uL   Eosinophils Relative 2  0 - 5 %   Eosinophils Absolute 0.1  0.0 - 0.7 K/uL   Basophils Relative 0  0 - 1 %   Basophils Absolute 0.0  0.0 - 0.1 K/uL  BASIC METABOLIC PANEL      Result Value Ref Range   Sodium 140  137 - 147 mEq/L   Potassium 4.2  3.7 - 5.3 mEq/L   Chloride 102  96 - 112 mEq/L   CO2 25  19 - 32 mEq/L   Glucose, Bld 97  70 - 99 mg/dL   BUN 9  6 - 23 mg/dL   Creatinine, Ser 6.29  0.50 - 1.10 mg/dL   Calcium 9.9  8.4 - 47.6 mg/dL   GFR calc non Af Amer 85 (*) >90 mL/min   GFR calc Af Amer >90  >90 mL/min  TROPONIN I      Result Value Ref Range   Troponin I <0.30  <0.30 ng/mL  URINALYSIS, ROUTINE W REFLEX MICROSCOPIC      Result Value Ref Range   Color, Urine YELLOW  YELLOW   APPearance CLOUDY  (*) CLEAR   Specific Gravity, Urine 1.020  1.005 - 1.030   pH 6.0  5.0 - 8.0   Glucose, UA NEGATIVE  NEGATIVE mg/dL   Hgb urine dipstick NEGATIVE  NEGATIVE   Bilirubin Urine NEGATIVE  NEGATIVE   Ketones, ur NEGATIVE  NEGATIVE mg/dL   Protein, ur NEGATIVE  NEGATIVE mg/dL   Urobilinogen, UA >5.4 (*) 0.0 - 1.0 mg/dL   Nitrite POSITIVE (*) NEGATIVE   Leukocytes, UA MODERATE (*) NEGATIVE  URINE MICROSCOPIC-ADD ON      Result Value Ref Range   Squamous Epithelial / LPF RARE  RARE   WBC, UA 11-20  <3 WBC/hpf   Bacteria, UA MANY (*) RARE   Urine-Other MUCOUS PRESENT     Dg Chest 2 View  12/09/2013   CLINICAL DATA:  Cough for several weeks.  EXAM: CHEST  2 VIEW  COMPARISON:  11/17/2011.  FINDINGS: Cardiopericardial silhouette is borderline for projection. Tortuous thoracic aorta with arch atherosclerosis. There is no airspace disease. No pleural effusion. Partially visualized lumbar spinal fixation hardware. Right costophrenic angle excluded from view on the frontal.  IMPRESSION: No acute cardiopulmonary disease.   Electronically Signed   By: Andreas Newport M.D.   On: 12/09/2013 15:07     Imaging Review Dg Chest 2 View  12/09/2013   CLINICAL DATA:  Cough for several weeks.  EXAM: CHEST  2 VIEW  COMPARISON:  11/17/2011.  FINDINGS: Cardiopericardial silhouette is borderline for projection. Tortuous thoracic aorta with arch atherosclerosis. There is no airspace disease. No pleural effusion. Partially visualized lumbar spinal fixation hardware. Right costophrenic angle excluded from view on the frontal.  IMPRESSION: No acute cardiopulmonary disease.   Electronically Signed   By: Andreas Newport M.D.   On: 12/09/2013 15:07     EKG Interpretation None      MDM   Final diagnoses:  UTI (lower urinary tract infection)  URI (upper respiratory infection)    Patient with cough x3-4 weeks. Chest x-ray is negative. Labs are reassuring.    Patients symptoms are consistent with URI, likely viral  etiology. Discussed that antibiotics are not indicated for viral infections. Pt will be discharged with symptomatic treatment.  Verbalizes understanding and is agreeable with plan. Pt is hemodynamically stable & in NAD prior to dc.  Patient has had a HR to 47-55.  Patient has been bradycardic in the past.  This was investigated by myself and Dr. Loretha Stapler.  Patient is not having any syncopal episodes or dizziness.  UA is remarkable for UTI.  Will treat with rocephin and discharge to home.  Patient seen by and discussed with Dr. Loretha Stapler, who agrees with the plan.    Roxy Horseman, PA-C 12/09/13 1700

## 2013-12-09 NOTE — ED Notes (Addendum)
Dr. Wofford at bedside 

## 2013-12-09 NOTE — ED Notes (Addendum)
Per pt, has been coughing for several weeks. Pt not in respiratory distress. Pt c/o of strong, productive cough. Pt unable to state what it looks like because she swallows the sputum. Pt c/o sore throat. Pt has been eating cough drops.  Pt states no meds have been given to her about the cough. Pt c/o of chronic back pain. Pt confined to bed and has limited use of arms and legs. Pt c/o headache from previous fall a month ago.

## 2013-12-09 NOTE — ED Notes (Signed)
Patient updated Mallory Burke has been called.

## 2013-12-09 NOTE — ED Notes (Signed)
Browning, PA at bedside 

## 2013-12-09 NOTE — ED Notes (Signed)
Patient transported to X-ray 

## 2013-12-09 NOTE — ED Notes (Signed)
Pt discharged back to [facility] via ambulance service [facility]. Pt stable. IV taken out. Discharge instructions were clearly explained and received by pt and family members at bedside. Report given to [place], RN at the facility.

## 2013-12-09 NOTE — ED Provider Notes (Signed)
Medical screening examination/treatment/procedure(s) were conducted as a shared visit with non-physician practitioner(s) and myself.  I personally evaluated the patient during the encounter.   EKG Interpretation None      80 year old female presenting with a chronic cough. On exam, well-appearing, nontoxic, not distressed, heart sounds normal with radiate cardiac rate and regular rhythm, do not appreciate a murmur on exam, lungs clear to auscultation bilaterally with good air movement.  Chest x-ray negative, lab work notable only for nitrite-positive UTI. Plan antibiotics, discharge, followup with PCP.  Clinical Impression: 1. UTI (lower urinary tract infection)   2. URI (upper respiratory infection)       Merrie Roof, MD 12/09/13 [phone]

## 2013-12-09 NOTE — ED Provider Notes (Signed)
Medical screening examination/treatment/procedure(s) were conducted as a shared visit with non-physician practitioner(s) and myself.  I personally evaluated the patient during the encounter.   EKG Interpretation None        Candyce Churn III, MD 12/09/13 [phone]

## 2013-12-09 NOTE — ED Notes (Signed)
Per EMS, pt arrives from [place] at [facility] with a cough and sore throat. Pt has been coughing for several days. Pts VS 150/70, P 54, R 18, O2 94% RM Air. No meds given. No chest pain noted.

## 2013-12-09 NOTE — Discharge Instructions (Signed)
Upper Respiratory Infection, Adult  An upper respiratory infection (URI) is also known as the common cold. It is often caused by a type of germ (virus). Colds are easily spread (contagious). You can pass it to others by kissing, coughing, sneezing, or drinking out of the same glass. Usually, you get better in 1 or 2 weeks.   HOME CARE    Only take medicine as told by your doctor.   Use a warm mist humidifier or breathe in steam from a hot shower.   Drink enough water and fluids to keep your pee (urine) clear or pale yellow.   Get plenty of rest.   Return to work when your temperature is back to normal or as told by your doctor. You may use a face mask and wash your hands to stop your cold from spreading.  GET HELP RIGHT AWAY IF:    After the first few days, you feel you are getting worse.   You have questions about your medicine.   You have chills, shortness of breath, or brown or red spit (mucus).   You have yellow or brown snot (nasal discharge) or pain in the face, especially when you bend forward.   You have a fever, puffy (swollen) neck, pain when you swallow, or white spots in the back of your throat.   You have a bad headache, ear pain, sinus pain, or chest pain.   You have a high-pitched whistling sound when you breathe in and out (wheezing).   You have a lasting cough or cough up blood.   You have sore muscles or a stiff neck.  MAKE SURE YOU:    Understand these instructions.   Will watch your condition.   Will get help right away if you are not doing well or get worse.  Document Released: 12/11/2007 Document Revised: 09/16/2011 Document Reviewed: 10/29/2010  ExitCare Patient Information 2014 [employer].    Urinary Tract Infection  Urinary tract infections (UTIs) can develop anywhere along your urinary tract. Your urinary tract is your body's drainage system for removing wastes and extra water. Your urinary tract includes two kidneys, two ureters, a bladder, and a urethra. Your kidneys  are a pair of bean-shaped organs. Each kidney is about the size of your fist. They are located below your ribs, one on each side of your spine.  CAUSES  Infections are caused by microbes, which are microscopic organisms, including fungi, viruses, and bacteria. These organisms are so small that they can only be seen through a microscope. Bacteria are the microbes that most commonly cause UTIs.  SYMPTOMS   Symptoms of UTIs may vary by age and gender of the patient and by the location of the infection. Symptoms in young women typically include a frequent and intense urge to urinate and a painful, burning feeling in the bladder or urethra during urination. Older women and men are more likely to be tired, shaky, and weak and have muscle aches and abdominal pain. A fever may mean the infection is in your kidneys. Other symptoms of a kidney infection include pain in your back or sides below the ribs, nausea, and vomiting.  DIAGNOSIS  To diagnose a UTI, your caregiver will ask you about your symptoms. Your caregiver also will ask to provide a urine sample. The urine sample will be tested for bacteria and white blood cells. White blood cells are made by your body to help fight infection.  TREATMENT   Typically, UTIs can be treated with medication.   Because most UTIs are caused by a bacterial infection, they usually can be treated with the use of antibiotics. The choice of antibiotic and length of treatment depend on your symptoms and the type of bacteria causing your infection.  HOME CARE INSTRUCTIONS   If you were prescribed antibiotics, take them exactly as your caregiver instructs you. Finish the medication even if you feel better after you have only taken some of the medication.   Drink enough water and fluids to keep your urine clear or pale yellow.   Avoid caffeine, tea, and carbonated beverages. They tend to irritate your bladder.   Empty your bladder often. Avoid holding urine for long periods of time.   Empty  your bladder before and after sexual intercourse.   After a bowel movement, women should cleanse from front to back. Use each tissue only once.  SEEK MEDICAL CARE IF:    You have back pain.   You develop a fever.   Your symptoms do not begin to resolve within 3 days.  SEEK IMMEDIATE MEDICAL CARE IF:    You have severe back pain or lower abdominal pain.   You develop chills.   You have nausea or vomiting.   You have continued burning or discomfort with urination.  MAKE SURE YOU:    Understand these instructions.   Will watch your condition.   Will get help right away if you are not doing well or get worse.  Document Released: 04/03/2005 Document Revised: 12/24/2011 Document Reviewed: 08/02/2011  ExitCare Patient Information 2014 [employer].

## 2013-12-09 NOTE — ED Notes (Addendum)
[facility] here to transport pt back to [facility]. Pt stable. Granddaughter notified. Belongings sent with pt back to facility.

## 2013-12-11 LAB — CULTURE, GROUP A STREP

## 2013-12-17 ENCOUNTER — Other Ambulatory Visit: Payer: Self-pay | Admitting: General Practice

## 2013-12-17 MED ORDER — LORAZEPAM 0.5 MG PO TABS: 0.5000 mg | ORAL_TABLET | Freq: Three times a day (TID) | ORAL | Status: DC

## 2013-12-17 NOTE — Telephone Encounter (Signed)
Lorazepam last filled on 08-02-13 (inpatient) #90 with 3 refills pt takes TID.    Refill ok per tabori.

## 2014-01-31 ENCOUNTER — Telehealth: Payer: Self-pay

## 2014-01-31 NOTE — Telephone Encounter (Signed)
Received fax for d/c of nystatin cream.  Paperwork placed in Dr. Rennis Goldenabori's folder

## 2014-01-31 NOTE — Telephone Encounter (Signed)
[facility] requesting d/c of nystatin cream.  Paper work placed in Dr. Rennis Goldenabori's folder for sig

## 2014-02-01 NOTE — Telephone Encounter (Signed)
Orders signed and faxed.

## 2014-02-14 IMAGING — CR DG PORTABLE PELVIS
1 series · 1 of 1 positions shown · non-contrast
Comparison: Radiographs of the right femur obtained concurrently;
prior radiographs of the lumbar spine 03/14/2012

CLINICAL DATA: Fall, hip pain

PORTABLE PELVIS

[AP]
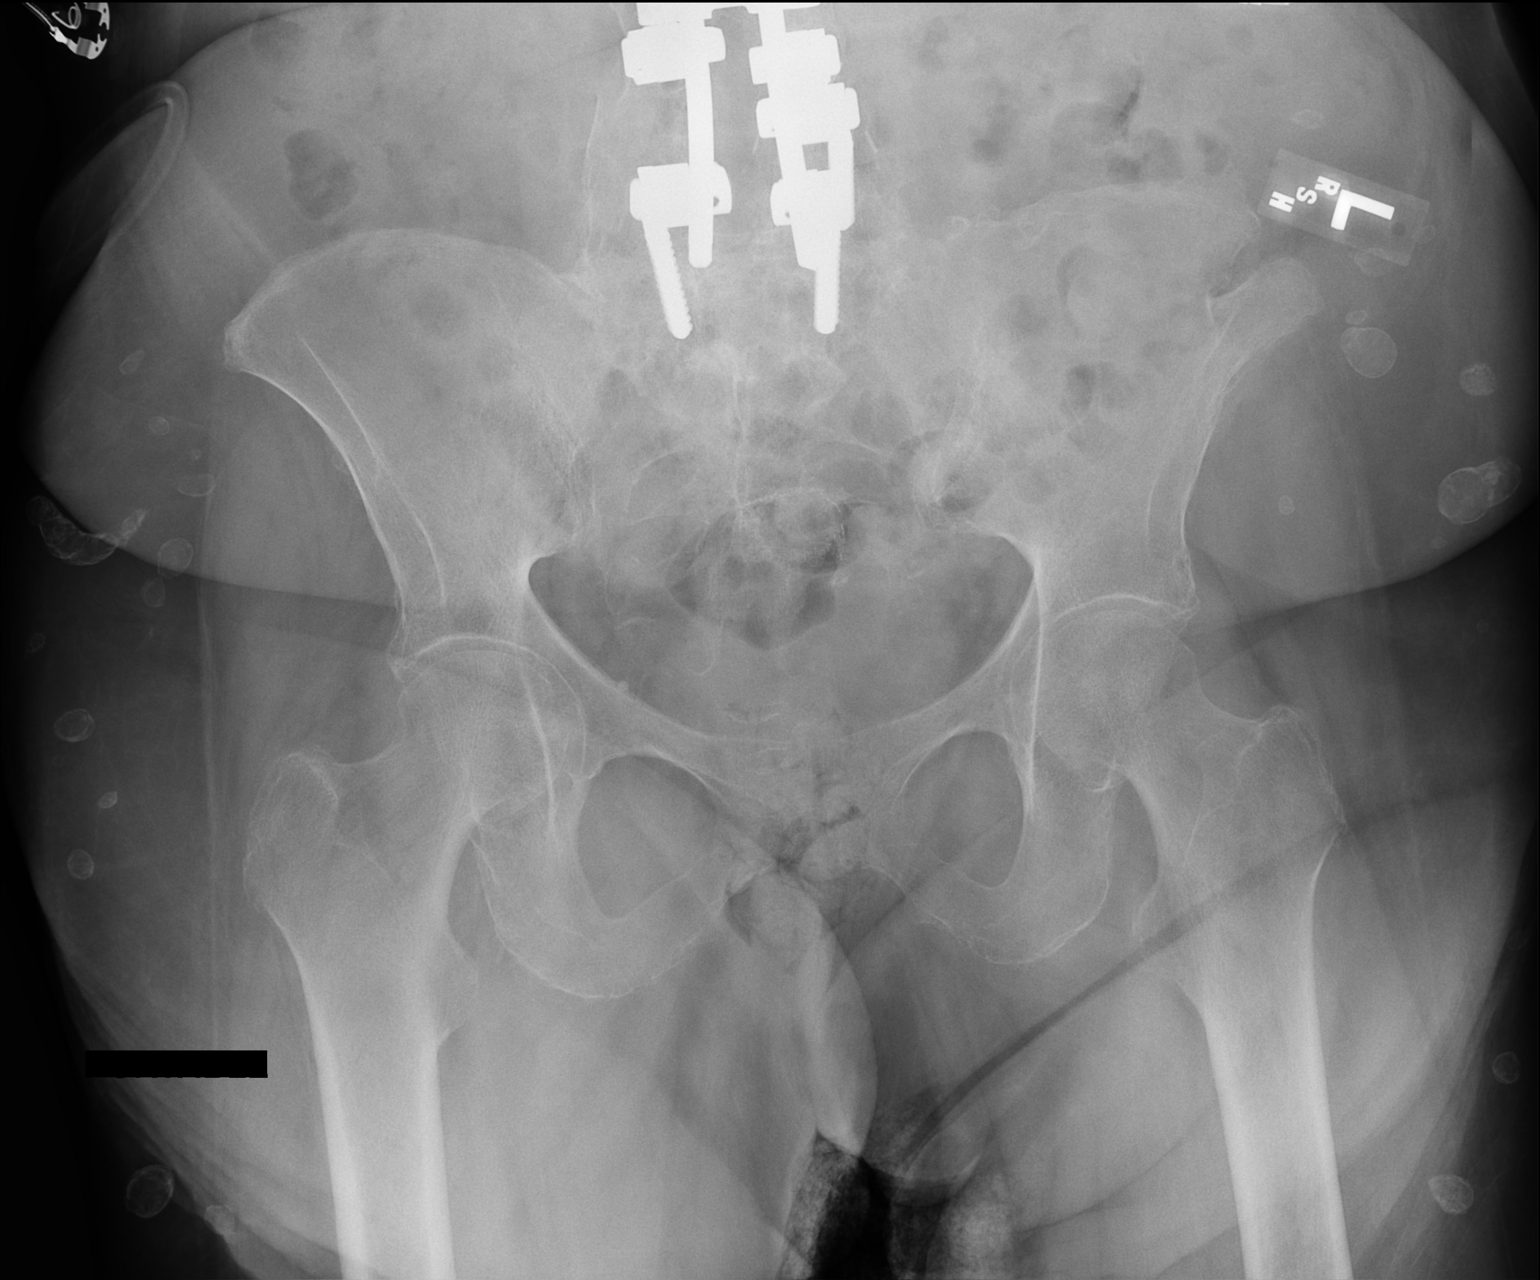

[1 of 1 positions shown; findings below may reference images not displayed]

FINDINGS: Single frontal view the pelvis demonstrates no definitive
acute fracture or malalignment.  The bones are osteopenic.
Incompletely imaged surgical changes of posterior lumbar interbody
fusion with bilateral pedicle screw and rod construct.  Bone graft
harvest site at the left iliac crest.  Numerous soft tissue
calcifications in the bilateral gluteal regions.  Unremarkable
bowel gas pattern.
IMPRESSION: 1.  No acute fracture identified.
2.  Osteopenia limits evaluation for nondisplaced fractures.

## 2014-02-21 ENCOUNTER — Telehealth: Payer: Self-pay | Admitting: General Practice

## 2014-02-21 MED ORDER — HYDROCODONE-ACETAMINOPHEN 10-325 MG PO TABS: 1.0000 | ORAL_TABLET | Freq: Four times a day (QID) | ORAL | Status: DC | PRN

## 2014-02-21 NOTE — Telephone Encounter (Signed)
Per PCP 

## 2014-02-21 NOTE — Telephone Encounter (Signed)
Med filled.  

## 2014-02-21 NOTE — Telephone Encounter (Signed)
Pt is in nursing home.   Last OV 01-13-13 Hydrocodone last filled 09-08-13 #60 with 0

## 2014-03-17 ENCOUNTER — Telehealth: Payer: Self-pay | Admitting: General Practice

## 2014-03-17 NOTE — Telephone Encounter (Signed)
Received a fax from [place] at [facility] stating that Patient is having periods of long tremors.   Called back to find out what type of tremors pt is having? Spoke with a nurse on staff who advised that Mallory Burke works with 1st shift who is gone for the day. 2nd shift did not know that pt was having tremors and no one had been in to evaluate her yet. All staff was at dinner.   Told nurse that per Dr. Rennis Golden orders pt needs MD evaluation ASAP. If no MD on staff then pt needs hospital visit immediately to rule out seizures. Also our office has no current labs on pt need to rule out metabolic abnormalities-thyroid, B12, glucose these could also cause tremors.   Nurse advised that she would have resident look at pt. Paperwork with Dr. Rennis Golden recommendations was also faxed back to home.

## 2014-03-22 NOTE — Telephone Encounter (Signed)
FYI  Received the same fax today dated from 03-17-14 stating 2nd attempt to contact office. Faxed back paperwork with telephone note from below (received confirmation fax was received).   Also called [facility] and spoke with a nurse (would not give her name) and was asked what I was calling in reference to. I told her about the fax we received and how we had responded the same day. I was told that pt was never taken to the Hospital or evaluated by a provider. When nurse was asked to further elaborate on type of tremors, longevity, and severity she could not tell me. Again TOLD nurse that pt needs to be taken to hospital for evaluation ASAP.   Tried calling PT daughter Bonita Quin who is listed on DPR (Phone was disconnected), called granddaughter to see if I could get a new contact number for her mom.   Message routed to Tabori.

## 2014-03-23 NOTE — Telephone Encounter (Signed)
This is unacceptable.  If pt is having 'tremors' we need to ensure that pt is not having seizures- as discussed previously w/ staff member.  It has been 5 days since we 1st received notification and if pt has not had a medical assessment and is still having 'tremors' she must be seen immediately.  If her facility or family is unable to comply w/ this recommendation, we may have to intervene on pt's behalf (call ombudsman or file report)

## 2014-03-23 NOTE — Telephone Encounter (Signed)
Spoke with Zakia from [facility] this morning and she advised that pt was going to be transported to hospital today to have metabolic panels drawn.   Advised that tremors mimicked small seizure activity and are abnormal for pt.

## 2014-03-23 NOTE — Telephone Encounter (Signed)
Agree w/ evaluation.

## 2014-03-25 ENCOUNTER — Ambulatory Visit (INDEPENDENT_AMBULATORY_CARE_PROVIDER_SITE_OTHER): Payer: Medicare Other | Admitting: Family Medicine

## 2014-03-25 ENCOUNTER — Encounter: Payer: Self-pay | Admitting: Family Medicine

## 2014-03-25 ENCOUNTER — Encounter: Payer: Self-pay | Admitting: General Practice

## 2014-03-25 VITALS — BP 140/70 | HR 56 | Temp 97.7°F | Resp 17

## 2014-03-25 DIAGNOSIS — R350 Frequency of micturition: Secondary | ICD-10-CM

## 2014-03-25 DIAGNOSIS — G252 Other specified forms of tremor: Secondary | ICD-10-CM

## 2014-03-25 DIAGNOSIS — R259 Unspecified abnormal involuntary movements: Secondary | ICD-10-CM

## 2014-03-25 DIAGNOSIS — E785 Hyperlipidemia, unspecified: Secondary | ICD-10-CM

## 2014-03-25 DIAGNOSIS — E538 Deficiency of other specified B group vitamins: Secondary | ICD-10-CM

## 2014-03-25 NOTE — Progress Notes (Signed)
Pre visit review using our clinic review tool, if applicable. No additional management support is needed unless otherwise documented below in the visit note. 

## 2014-03-25 NOTE — Patient Instructions (Signed)
Please schedule your complete physical in 6 months We'll notify you of your lab results and make any changes if needed Please have [facility] do a urine dip and culture due to your urinary frequency If the tremors worsen, please call so we can have you see a neurologist if necessary Call with any questions or concerns Hang in there!!

## 2014-03-25 NOTE — Progress Notes (Signed)
   Subjective:    Patient ID: Mallory Burke, female    DOB: August 05, 1932, 80 y.o.   MRN: [id number]  HPI Tremors- pt had fall Nov 2014 ago, hitting head.  Went to [facility] hospital for head injury w/ laceration.  Pt reports she has had tremors since her fall.  Tremors will occur in both hands and both legs.  Pt reports tremors will last 5-10 minutes and then subside.  She was told to put feet flat on the floor or pump her fist to make tremors stop.  Pt denies pain w/ tremor.  Son does not feel these tremors are significant, 'they're just tremors'.  Son denies seizure activity.  Pt is able to speak and answer questions while having tremor.  Pt reports 'it's not a seizure'  Son feels tremors are due to attempted activity in the setting of severe deconditioning  Urinary frequency- pt reports increased urinary frequency.  'like last time when i had an infxn'.  Pt unable to urinate here in office b/c it takes considerable assistance to transfer her to toilet- wears adult diapers.  Hyperlipidemia- chronic problem.  No recent labs on pt.  Denies abd pain, N/V.  + chronic myalgias   Review of Systems For ROS see HPI     Objective:   Physical Exam  Vitals reviewed. Constitutional: She appears well-developed and well-nourished. No distress.  Elderly obese woman sitting in wheelchair  HENT:  Head: Normocephalic and atraumatic.  Neck: Neck supple. No thyromegaly present.  Cardiovascular: Normal rate, regular rhythm, normal heart sounds and intact distal pulses.   Pulmonary/Chest: Effort normal and breath sounds normal. No respiratory distress. She has no wheezes. She has no rales.  Abdominal: Soft. Bowel sounds are normal. She exhibits no distension. There is no tenderness. There is no rebound.  Lymphadenopathy:    She has no cervical adenopathy.  Neurological: She is alert.  No noted tremors on PE.  Skin: Skin is warm and dry. No rash noted. No erythema.  Psychiatric: She has a normal mood and  affect. Her behavior is normal.          Assessment & Plan:

## 2014-03-27 NOTE — Assessment & Plan Note (Signed)
New to provider, ongoing for pt.  Not visible on PE today.  Son feels tremor is due to pt's extreme deconditioning and attempt to use muscles.  Both feel that tremor is not consistent w/ seizure.  Check labs to r/o metabolic disturbance/cause.  Will follow.

## 2014-03-27 NOTE — Assessment & Plan Note (Signed)
New.  Pt unable to given urine sample in office today.  Pt sent home w/ instructions to get UA and culture to nursing home.  Will follow.

## 2014-03-27 NOTE — Assessment & Plan Note (Signed)
Chronic problem.  Pt is tolerating statin w/o difficulty.  Check labs.  Adjust meds prn  

## 2014-03-27 NOTE — Assessment & Plan Note (Signed)
Pt w/ hx of similar.  If again low, may be contributing to pt's tremors.  Check labs.  Replete prn.

## 2014-03-28 ENCOUNTER — Telehealth: Payer: Self-pay | Admitting: Family Medicine

## 2014-03-28 NOTE — Telephone Encounter (Signed)
Letter typed up, call returned to [place] who gave me her direct fax [phone]. Paperwork signed by tabori and faxed on 9/21 and 12pm.

## 2014-03-28 NOTE — Telephone Encounter (Signed)
Pt needs the following labs: Lipid panel- dx 272.4 Hepatic panel/LFTs- dx 272.4 BMP- 781.0 TSH- 781.0 CBC w/ diff- 781.0 B12 and folate- 781.0  UA and culture- dx 788.41  Please try and have pt use a hat but if unable to void, ok for I/O cath

## 2014-03-28 NOTE — Telephone Encounter (Signed)
Caller name: Laqueta Relation to pt: other [facility]  Call back number: [phone]   Reason for call:   [facility] requesting orders for blood work and RX for cathter pt is incontinent

## 2014-04-01 ENCOUNTER — Encounter: Payer: Self-pay | Admitting: General Practice

## 2014-04-06 ENCOUNTER — Encounter: Payer: Self-pay | Admitting: General Practice

## 2014-04-06 MED ORDER — CEPHALEXIN 500 MG PO CAPS: 500.0000 mg | ORAL_CAPSULE | Freq: Two times a day (BID) | ORAL | Status: DC

## 2014-04-08 ENCOUNTER — Encounter: Payer: Self-pay | Admitting: Family Medicine

## 2014-04-13 ENCOUNTER — Telehealth: Payer: Self-pay | Admitting: General Practice

## 2014-04-13 NOTE — Telephone Encounter (Signed)
Last OV 03-25-14 Lorazepam last filled 12-17-13 #90 with 3

## 2014-04-14 MED ORDER — LORAZEPAM 0.5 MG PO TABS: 0.5000 mg | ORAL_TABLET | Freq: Three times a day (TID) | ORAL | Status: DC

## 2014-04-14 NOTE — Telephone Encounter (Signed)
Med filled and faxed.  

## 2014-04-14 NOTE — Telephone Encounter (Signed)
Ok for #90 w/ 3

## 2014-04-18 ENCOUNTER — Encounter: Payer: Self-pay | Admitting: Family Medicine

## 2014-04-18 ENCOUNTER — Ambulatory Visit (INDEPENDENT_AMBULATORY_CARE_PROVIDER_SITE_OTHER): Payer: Medicare Other | Admitting: Family Medicine

## 2014-04-18 VITALS — BP 140/80 | HR 51 | Temp 97.9°F | Resp 16

## 2014-04-18 DIAGNOSIS — E039 Hypothyroidism, unspecified: Secondary | ICD-10-CM

## 2014-04-18 DIAGNOSIS — B182 Chronic viral hepatitis C: Secondary | ICD-10-CM

## 2014-04-18 DIAGNOSIS — B1920 Unspecified viral hepatitis C without hepatic coma: Secondary | ICD-10-CM | POA: Insufficient documentation

## 2014-04-18 DIAGNOSIS — G252 Other specified forms of tremor: Secondary | ICD-10-CM

## 2014-04-18 DIAGNOSIS — M47812 Spondylosis without myelopathy or radiculopathy, cervical region: Secondary | ICD-10-CM

## 2014-04-18 DIAGNOSIS — E038 Other specified hypothyroidism: Secondary | ICD-10-CM

## 2014-04-18 MED ORDER — LEVOTHYROXINE SODIUM 75 MCG PO TABS: 75.0000 ug | ORAL_TABLET | Freq: Every day | ORAL | Status: DC

## 2014-04-18 NOTE — Assessment & Plan Note (Signed)
Chronic problem.  meds were adjusted at last visit due to low TSH.  Due for repeat TSH today.

## 2014-04-18 NOTE — Assessment & Plan Note (Signed)
New.  Pt reports she has been told this previously and completed treatment for it.  Wants to know if she actually has this dx.  Due to pt's dementia, unable to rely on pt's report.  Will get labs to assess.

## 2014-04-18 NOTE — Progress Notes (Signed)
   Subjective:    Patient ID: [place] SinkHelen Burke, female    DOB: [birth year], 80 y.o.   MRN: [id number]  HPI Tremors- pt reports tremors are worse than previous.  Pt notes that R arm is painful, unable to feed herself.  Pt finds whole body shaking- will start in bilateral LEs, extend upward into back and head.  R arm will also shake at times, L arm doesn't seem to be affected.  Pt denies being anxious or nervous at time of episodes.  Able to speak during episode and follow directions.  Neck pain- chronic problem for pt.  Pt reports she is not getting PT at nursing home.  Pt has script for pain meds but record shows she has only had meds twice in last month.  Hep C- pt was told that previously she has infx.  'every time I have surgery they tell me i have it'.  'i took the shots and it cleared up'.  Hypothyroid- pt's TSH was found to be low at last visit.  meds were decreased.  Pt due for repeat TSH.   Review of Systems For ROS see HPI     Objective:   Physical Exam  Vitals reviewed. Constitutional: She appears well-developed and well-nourished. She appears distressed (clearly uncomfortable elderly woman in wheelchair).  HENT:  Head: Normocephalic and atraumatic.  Eyes: Pupils are equal, round, and reactive to light.  Cardiovascular: Normal rate, regular rhythm, normal heart sounds and intact distal pulses.   Pulmonary/Chest: Effort normal and breath sounds normal. No respiratory distress. She has no wheezes. She has no rales.  Neurological:  R hand contracted Pt w/ nearly violent shaking that starts in bilateral legs and extends upward throughout body- pt able to make eye contact and follow directions during shaking episodes but unable to speak to me in office.  Occurred multiple times, lasting for seconds, and resolved spontaneously  Skin: Skin is warm and dry.  Psychiatric:  Anxious, tearful          Assessment & Plan:

## 2014-04-18 NOTE — Progress Notes (Signed)
Pre visit review using our clinic review tool, if applicable. No additional management support is needed unless otherwise documented below in the visit note. 

## 2014-04-18 NOTE — Patient Instructions (Signed)
We'll notify you of with your lab results Someone will call you with your Neuro appt Make sure you are asking for the pain medication if you need it They have an order to start PT to improve your range of motion Call with any questions or concerns Hang in there!!!
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
## 2014-04-18 NOTE — Assessment & Plan Note (Signed)
Deteriorated.  Pt w/ multiple witnessed episodes today in office.  Not consistent w/ tremor.  Not consistent w/ seizure.  Possible myoclonus.  Will refer to neuro for urgent evaluation.  Pt expressed understanding and is in agreement w/ plan.

## 2014-04-19 ENCOUNTER — Encounter: Payer: Self-pay | Admitting: General Practice

## 2014-04-19 LAB — HEPATITIS C ANTIBODY: HCV Ab: REACTIVE — AB

## 2014-04-19 LAB — TSH: TSH: 0.04 u[IU]/mL — ABNORMAL LOW (ref 0.35–4.50)

## 2014-04-20 ENCOUNTER — Telehealth: Payer: Self-pay | Admitting: Neurology

## 2014-04-20 LAB — HEPATITIS C RNA QUANTITATIVE: HCV QUANT: NOT DETECTED [IU]/mL (ref <15)

## 2014-04-20 NOTE — Telephone Encounter (Signed)
Pt's  Daughter called to cancel new pt appt. She did not r/s. Referring provider office notified of cancellation / Oneita KrasSherri S

## 2014-04-21 ENCOUNTER — Encounter: Payer: Self-pay | Admitting: General Practice

## 2014-05-12 ENCOUNTER — Ambulatory Visit: Payer: 59 | Admitting: Neurology

## 2014-09-22 ENCOUNTER — Telehealth: Payer: Self-pay | Admitting: Family Medicine

## 2014-09-22 ENCOUNTER — Ambulatory Visit: Payer: Medicare Other | Admitting: Family Medicine

## 2014-09-22 NOTE — Telephone Encounter (Signed)
Spoke with PT nurse @ [facility]- She is now seeing Dr. Brooke DareKing with their onsite care program. I removed her appointment from the schedule today.

## 2014-09-22 NOTE — Telephone Encounter (Signed)
Please remove my name from the header

## 2014-09-22 NOTE — Telephone Encounter (Signed)
Tabori removed as PCP.  

## 2015-03-07 ENCOUNTER — Emergency Department (HOSPITAL_COMMUNITY): Payer: Medicare Other

## 2015-03-07 ENCOUNTER — Emergency Department (HOSPITAL_COMMUNITY)
Admission: EM | Admit: 2015-03-07 | Discharge: 2015-03-07 | Disposition: A | Discharge status: Home / Self Care | Payer: Medicare Other | Attending: Emergency Medicine | Admitting: Emergency Medicine

## 2015-03-07 ENCOUNTER — Encounter (HOSPITAL_COMMUNITY): Payer: Self-pay | Admitting: Emergency Medicine

## 2015-03-07 DIAGNOSIS — E039 Hypothyroidism, unspecified: Secondary | ICD-10-CM | POA: Diagnosis not present

## 2015-03-07 DIAGNOSIS — Z862 Personal history of diseases of the blood and blood-forming organs and certain disorders involving the immune mechanism: Secondary | ICD-10-CM | POA: Diagnosis not present

## 2015-03-07 DIAGNOSIS — Z8619 Personal history of other infectious and parasitic diseases: Secondary | ICD-10-CM | POA: Insufficient documentation

## 2015-03-07 DIAGNOSIS — N390 Urinary tract infection, site not specified: Secondary | ICD-10-CM | POA: Diagnosis not present

## 2015-03-07 DIAGNOSIS — Z7952 Long term (current) use of systemic steroids: Secondary | ICD-10-CM | POA: Diagnosis not present

## 2015-03-07 DIAGNOSIS — Z8709 Personal history of other diseases of the respiratory system: Secondary | ICD-10-CM | POA: Diagnosis not present

## 2015-03-07 DIAGNOSIS — Z7951 Long term (current) use of inhaled steroids: Secondary | ICD-10-CM | POA: Diagnosis not present

## 2015-03-07 DIAGNOSIS — Z79899 Other long term (current) drug therapy: Secondary | ICD-10-CM | POA: Diagnosis not present

## 2015-03-07 DIAGNOSIS — G8929 Other chronic pain: Secondary | ICD-10-CM | POA: Diagnosis not present

## 2015-03-07 DIAGNOSIS — Z792 Long term (current) use of antibiotics: Secondary | ICD-10-CM | POA: Diagnosis not present

## 2015-03-07 DIAGNOSIS — R001 Bradycardia, unspecified: Secondary | ICD-10-CM | POA: Diagnosis not present

## 2015-03-07 DIAGNOSIS — F329 Major depressive disorder, single episode, unspecified: Secondary | ICD-10-CM | POA: Insufficient documentation

## 2015-03-07 DIAGNOSIS — R011 Cardiac murmur, unspecified: Secondary | ICD-10-CM | POA: Insufficient documentation

## 2015-03-07 DIAGNOSIS — Z8719 Personal history of other diseases of the digestive system: Secondary | ICD-10-CM | POA: Insufficient documentation

## 2015-03-07 DIAGNOSIS — E785 Hyperlipidemia, unspecified: Secondary | ICD-10-CM | POA: Insufficient documentation

## 2015-03-07 DIAGNOSIS — M549 Dorsalgia, unspecified: Secondary | ICD-10-CM | POA: Diagnosis present

## 2015-03-07 DIAGNOSIS — M542 Cervicalgia: Secondary | ICD-10-CM | POA: Diagnosis not present

## 2015-03-07 DIAGNOSIS — M1990 Unspecified osteoarthritis, unspecified site: Secondary | ICD-10-CM | POA: Insufficient documentation

## 2015-03-07 LAB — URINALYSIS, ROUTINE W REFLEX MICROSCOPIC
BILIRUBIN URINE: NEGATIVE
Glucose, UA: NEGATIVE mg/dL
KETONES UR: NEGATIVE mg/dL
Nitrite: NEGATIVE
PROTEIN: NEGATIVE mg/dL
Specific Gravity, Urine: 1.01 (ref 1.005–1.030)
UROBILINOGEN UA: 2 mg/dL — AB (ref 0.0–1.0)
pH: 7 (ref 5.0–8.0)

## 2015-03-07 LAB — COMPREHENSIVE METABOLIC PANEL
ALK PHOS: 63 U/L (ref 38–126)
ALT: 15 U/L (ref 14–54)
AST: 29 U/L (ref 15–41)
Albumin: 4.4 g/dL (ref 3.5–5.0)
Anion gap: 8 (ref 5–15)
BUN: 7 mg/dL (ref 6–20)
CALCIUM: 9.5 mg/dL (ref 8.9–10.3)
CO2: 29 mmol/L (ref 22–32)
CREATININE: 0.68 mg/dL (ref 0.44–1.00)
Chloride: 104 mmol/L (ref 101–111)
GFR calc non Af Amer: >60 mL/min (ref >60)
GLUCOSE: 93 mg/dL (ref 65–99)
Potassium: 4.1 mmol/L (ref 3.5–5.1)
SODIUM: 141 mmol/L (ref 135–145)
TOTAL PROTEIN: 7.7 g/dL (ref 6.5–8.1)
Total Bilirubin: 1.5 mg/dL — ABNORMAL HIGH (ref 0.3–1.2)

## 2015-03-07 LAB — CBC WITH DIFFERENTIAL/PLATELET
BASOS PCT: 0 % (ref 0–1)
Basophils Absolute: 0 10*3/uL (ref 0.0–0.1)
EOS ABS: 0 10*3/uL (ref 0.0–0.7)
Eosinophils Relative: 1 % (ref 0–5)
HCT: 45.8 % (ref 36.0–46.0)
Hemoglobin: 14.9 g/dL (ref 12.0–15.0)
Lymphocytes Relative: 59 % — ABNORMAL HIGH (ref 12–46)
Lymphs Abs: 2.6 10*3/uL (ref 0.7–4.0)
MCH: 32.3 pg (ref 26.0–34.0)
MCHC: 32.5 g/dL (ref 30.0–36.0)
MCV: 99.1 fL (ref 78.0–100.0)
MONO ABS: 0.3 10*3/uL (ref 0.1–1.0)
MONOS PCT: 6 % (ref 3–12)
NEUTROS PCT: 34 % — AB (ref 43–77)
Neutro Abs: 1.5 10*3/uL — ABNORMAL LOW (ref 1.7–7.7)
Platelets: 211 10*3/uL (ref 150–400)
RBC: 4.62 MIL/uL (ref 3.87–5.11)
RDW: 12.9 % (ref 11.5–15.5)
WBC: 4.4 10*3/uL (ref 4.0–10.5)

## 2015-03-07 LAB — URINE MICROSCOPIC-ADD ON

## 2015-03-07 MED ORDER — OXYCODONE-ACETAMINOPHEN 5-325 MG PO TABS
1.0000 | ORAL_TABLET | Freq: Once | ORAL | Status: AC
  Administered 2015-03-07: 1 via ORAL
  Filled 2015-03-07: qty 1

## 2015-03-07 MED ORDER — CEPHALEXIN 500 MG PO CAPS: 500.0000 mg | ORAL_CAPSULE | Freq: Two times a day (BID) | ORAL | Status: DC

## 2015-03-07 MED ORDER — CEFTRIAXONE SODIUM 1 G IJ SOLR
1.0000 g | Freq: Once | INTRAMUSCULAR | Status: AC
  Administered 2015-03-07: 1 g via INTRAVENOUS
  Filled 2015-03-07: qty 10

## 2015-03-07 NOTE — ED Provider Notes (Signed)
CSN: [id number]     Arrival date & time 03/07/15  1653 History   First MD Initiated Contact with Patient 03/07/15 1709     Chief Complaint  Patient presents with  . Back Pain    chronic     Patient is a 81 y.o. female presenting with back pain. The history is provided by the patient. No language interpreter was used.  Back Pain  [name] presents for evaluation of neck pain. She reports 2-3 months of constant posterior neck pain.  Pain is worse with turning her head.  She has a hx/o injection to that area several years ago and has had problems since then.  She denies fevers, chest pain, SOB, vomiting.  She does have some, occasional diarrhea, dysuria and frequent urination, as well as headache.  Sxs are moderate, constant, worsening.  She presents today because the pain is worsening and she cannot sleep.  At baseline she is non-ambulatory and incontinent of urine.  Family states that the facility will not take her back ([facility]) due to her care needs being too great for what they can provide (pt needs a hoyer lift...). The patient has no addiction noticed for tomorrow from the assisted living and she does not currently have an additional living location arranged.  Past Medical History  Diagnosis Date  . Chicken pox   . Hyperlipidemia   . Depression   . Hypothyroid   . Bronchitis     hx of  . Anemia   . Blood transfusion     hx of  . Hepatitis     Hx of  . Urinary frequency   . Urinary tract infection     hx of  . Arthritis     "legs, neck"  . Ulcer, stomach peptic     hx of  . Heart murmur     patient's primary Dr. Cranston Neighbor   Past Surgical History  Procedure Laterality Date  . Back surgery      x3, fusion and rod  . Abdominal hysterectomy    . Knee arthroscopy  2007    right  . Abdominal surgery    . Tubal ligation    . Dilation and curettage of uterus    . Breast lumpectomy      right side  . Posterior cervical fusion/foraminotomy  11/13/2011     Procedure: POSTERIOR CERVICAL FUSION/FORAMINOTOMY LEVEL 1;  Surgeon: Carmela Hurt, MD;  Location: [facility];  Service: Neurosurgery;  Laterality: N/A;  Cervical one Cervical two posterior transpedicular fusion with lateral mass screws   Family History  Problem Relation Age of Onset  . Arthritis    . Hyperlipidemia    . Hypertension     Social History  Substance Use Topics  . Smoking status: Never Smoker   . Smokeless tobacco: None  . Alcohol Use: No   OB History    No data available     Review of Systems  Musculoskeletal: Positive for back pain.  All other systems reviewed and are negative.     Allergies  Review of patient's allergies indicates no known allergies.  Home Medications   Prior to Admission medications   Medication Sig Start Date End Date Taking? Authorizing Provider  acetaminophen (TYLENOL) 325 MG tablet Take 650 mg by mouth 2 (two) times daily.    Historical Provider, MD  acetaminophen (TYLENOL) 500 MG tablet Take 1 tablet (500 mg total) by mouth every 6 (six) hours as needed. 12/09/13  Roxy Horseman, PA-C  cephALEXin (KEFLEX) 500 MG capsule Take 1 capsule (500 mg total) by mouth 2 (two) times daily. 04/06/14   Sheliah Hatch, MD  citalopram (CELEXA) 10 MG tablet Take 10 mg by mouth every morning.     Historical Provider, MD  Cyanocobalamin (B-12) 1000 MCG SUBL Place 1,000 mcg under the tongue daily.    Historical Provider, MD  diclofenac sodium (VOLTAREN) 1 % GEL Apply 2 g topically 4 (four) times daily. To each knee    Historical Provider, MD  docusate sodium (COLACE) 100 MG capsule Take 100 mg by mouth daily.    Historical Provider, MD  donepezil (ARICEPT) 10 MG tablet Take 10 mg by mouth at bedtime.     Historical Provider, MD  fluticasone (FLONASE) 50 MCG/ACT nasal spray Place 2 sprays into both nostrils daily.    Historical Provider, MD  gabapentin (NEURONTIN) 300 MG capsule Take 300 mg by mouth at bedtime.    Historical Provider, MD   hydrochlorothiazide (,MICROZIDE/HYDRODIURIL,) 12.5 MG capsule Take 12.5 mg by mouth every morning.     Historical Provider, MD  HYDROcodone-acetaminophen (NORCO) 10-325 MG per tablet Take 1 tablet by mouth every 6 (six) hours as needed. 02/21/14   Sheliah Hatch, MD  hydrocortisone cream-nystatin cream-zinc oxide Apply 1 application topically daily. 12/09/13   Roxy Horseman, PA-C  levothyroxine (SYNTHROID, LEVOTHROID) 75 MCG tablet Take 1 tablet (75 mcg total) by mouth daily. 04/18/14   Sheliah Hatch, MD  loperamide (IMODIUM) 2 MG capsule Take 2 mg by mouth 4 (four) times daily as needed for diarrhea or loose stools.    Historical Provider, MD  LORazepam (ATIVAN) 0.5 MG tablet Take 1 tablet (0.5 mg total) by mouth 3 (three) times daily. 04/14/14   Sheliah Hatch, MD  methocarbamol (ROBAXIN) 750 MG tablet Take 750 mg by mouth 3 (three) times daily. Muscle spasms.    Historical Provider, MD  nystatin ointment (MYCOSTATIN) Apply 1 application topically 2 (two) times daily.    Historical Provider, MD  omeprazole (PRILOSEC) 20 MG capsule Take 20 mg by mouth daily.    Historical Provider, MD  phenol (CHLORASEPTIC) 1.4 % LIQD Use as directed 2 sprays in the mouth or throat as needed (for sore throat).    Historical Provider, MD  potassium chloride SA (K-DUR,KLOR-CON) 20 MEQ tablet Take 20 mEq by mouth daily with breakfast. Do not  Crush. Take with food.    Historical Provider, MD  rosuvastatin (CRESTOR) 20 MG tablet Take 20 mg by mouth daily.    Historical Provider, MD  ZINC OXIDE, TOPICAL, (SECURA PROTECTIVE) 10 % CREA Apply 1 application topically 3 (three) times daily. Skin tear on bottom    Historical Provider, MD   BP 168/70 mmHg  Pulse 48  Temp(Src) 98.4 F (36.9 C) (Oral)  Resp 18  SpO2 97% Physical Exam  Constitutional: She appears well-developed and well-nourished.  HENT:  Head: Normocephalic and atraumatic.  Cardiovascular: Normal rate.   No murmur heard. bradycardic   Pulmonary/Chest: Effort normal and breath sounds normal. No respiratory distress.  Abdominal: Soft. There is no tenderness. There is no rebound and no guarding.  Musculoskeletal: She exhibits no edema or tenderness.  Neurological: She is alert.  Disoriented to time.  1/5 strength in BLE, 3/5 strength in RUE, 2/5 strength in LUE.  Skin: Skin is warm and dry.  Psychiatric: She has a normal mood and affect. Her behavior is normal.  Nursing note and vitals reviewed.   ED Course  Procedures (including critical care time) Labs Review Labs Reviewed  COMPREHENSIVE METABOLIC PANEL - Abnormal; Notable for the following:    Total Bilirubin 1.5 (*)    All other components within normal limits  CBC WITH DIFFERENTIAL/PLATELET - Abnormal; Notable for the following:    Neutrophils Relative % 34 (*)    Neutro Abs 1.5 (*)    Lymphocytes Relative 59 (*)    All other components within normal limits  URINALYSIS, ROUTINE W REFLEX MICROSCOPIC (NOT AT [facility]) - Abnormal; Notable for the following:    APPearance CLOUDY (*)    Hgb urine dipstick MODERATE (*)    Urobilinogen, UA 2.0 (*)    Leukocytes, UA LARGE (*)    All other components within normal limits  URINE MICROSCOPIC-ADD ON - Abnormal; Notable for the following:    Bacteria, UA MANY (*)    All other components within normal limits  URINE CULTURE    Imaging Review Dg Chest 1 View  03/07/2015   CLINICAL DATA:  Chronic back pain  EXAM: CHEST  1 VIEW  COMPARISON:  December 09, 2013  FINDINGS: The heart size and mediastinal contours are stable. The aorta is tortuous. Heart size is enlarged. Both lungs are clear. The visualized skeletal structures are unremarkable.  IMPRESSION: No active cardiopulmonary disease.   Electronically Signed   By: Sherian Rein M.D.   On: 03/07/2015 18:13   Ct Cervical Spine Wo Contrast  03/07/2015   CLINICAL DATA:  Patient with chronic back pain. Fall 1 week prior. Neck pain. Initial encounter.  EXAM: CT CERVICAL SPINE WITHOUT  CONTRAST  TECHNIQUE: Multidetector CT imaging of the cervical spine was performed without intravenous contrast. Multiplanar CT image reconstructions were also generated.  COMPARISON:  CT cervical spine 05/10/2013  FINDINGS: Relative straightening of the normal cervical lordosis. Multilevel degenerative disc disease. Preservation of the vertebral body heights. Craniocervical junction intact. Postprocedural changes compatible with C1-2 fusion.  IMPRESSION: No evidence for acute cervical spine fracture.   Electronically Signed   By: Annia Belt M.D.   On: 03/07/2015 18:18   I have personally reviewed and evaluated these images and lab results as part of my medical decision-making.   EKG Interpretation None      MDM   Final diagnoses:  Chronic neck pain  Acute UTI   patient with chronic neck pain and debility here for a violation of her chronic pain. Her family is concerned due to the living situation that she is currently in and that she will be evicted from her assisted living starting tomorrow. They have been working on placement but this is not currently been arranged. There is a question of her possibly falling a week ago. UA is consistent with UTI but patient has no systemic symptoms. Providing with this of Rocephin in the emergency department, sending cultures and will send out on oral antibiotics. Case management has seen the patient and provided assistance with finding additional placement.  Tilden Fossa, MD 03/07/15 2259

## 2015-03-07 NOTE — Discharge Instructions (Signed)
[name] can have her hydrocodone 10/325mg  every 8 hours as needed for pain (increased from twice daily).    Chronic Pain Chronic pain can be defined as pain that is off and on and lasts for 3-6 months or longer. Many things cause chronic pain, which can make it difficult to make a diagnosis. There are many treatment options available for chronic pain. However, finding a treatment that works well for you may require trying various approaches until the right one is found. Many people benefit from a combination of two or more types of treatment to control their pain. SYMPTOMS  Chronic pain can occur anywhere in the body and can range from mild to very severe. Some types of chronic pain include:  Headache.  Low back pain.  Cancer pain.  Arthritis pain.  Neurogenic pain. This is pain resulting from damage to nerves. People with chronic pain may also have other symptoms such as:  Depression.  Anger.  Insomnia.  Anxiety. DIAGNOSIS  Your health care provider will help diagnose your condition over time. In many cases, the initial focus will be on excluding possible conditions that could be causing the pain. Depending on your symptoms, your health care provider may order tests to diagnose your condition. Some of these tests may include:   Blood tests.   CT scan.   MRI.   X-rays.   Ultrasounds.   Nerve conduction studies.  You may need to see a specialist.  TREATMENT  Finding treatment that works well may take time. You may be referred to a pain specialist. He or she may prescribe medicine or therapies, such as:   Mindful meditation or yoga.  Shots (injections) of numbing or pain-relieving medicines into the spine or area of pain.  Local electrical stimulation.  Acupuncture.   Massage therapy.   Aroma, color, light, or sound therapy.   Biofeedback.   Working with a physical therapist to keep from getting stiff.   Regular, gentle exercise.    Cognitive or behavioral therapy.   Group support.  Sometimes, surgery may be recommended.  HOME CARE INSTRUCTIONS   Take all medicines as directed by your health care provider.   Lessen stress in your life by relaxing and doing things such as listening to calming music.   Exercise or be active as directed by your health care provider.   Eat a healthy diet and include things such as vegetables, fruits, fish, and lean meats in your diet.   Keep all follow-up appointments with your health care provider.   Attend a support group with others suffering from chronic pain. SEEK MEDICAL CARE IF:   Your pain gets worse.   You develop a new pain that was not there before.   You cannot tolerate medicines given to you by your health care provider.   You have new symptoms since your last visit with your health care provider.  SEEK IMMEDIATE MEDICAL CARE IF:   You feel weak.   You have decreased sensation or numbness.   You lose control of bowel or bladder function.   Your pain suddenly gets much worse.   You develop shaking.  You develop chills.  You develop confusion.  You develop chest pain.  You develop shortness of breath.  MAKE SURE YOU:  Understand these instructions.  Will watch your condition.  Will get help right away if you are not doing well or get worse. Document Released: 03/16/2002 Document Revised: 02/24/2013 Document Reviewed: 12/18/2012 [facility] Patient Information 2015 [place], [place].  This information is not intended to replace advice given to you by your health care provider. Make sure you discuss any questions you have with your health care provider.  Urinary Tract Infection Urinary tract infections (UTIs) can develop anywhere along your urinary tract. Your urinary tract is your body's drainage system for removing wastes and extra water. Your urinary tract includes two kidneys, two ureters, a bladder, and a urethra. Your kidneys are  a pair of bean-shaped organs. Each kidney is about the size of your fist. They are located below your ribs, one on each side of your spine. CAUSES Infections are caused by microbes, which are microscopic organisms, including fungi, viruses, and bacteria. These organisms are so small that they can only be seen through a microscope. Bacteria are the microbes that most commonly cause UTIs. SYMPTOMS  Symptoms of UTIs may vary by age and gender of the patient and by the location of the infection. Symptoms in young women typically include a frequent and intense urge to urinate and a painful, burning feeling in the bladder or urethra during urination. Older women and men are more likely to be tired, shaky, and weak and have muscle aches and abdominal pain. A fever may mean the infection is in your kidneys. Other symptoms of a kidney infection include pain in your back or sides below the ribs, nausea, and vomiting. DIAGNOSIS To diagnose a UTI, your caregiver will ask you about your symptoms. Your caregiver also will ask to provide a urine sample. The urine sample will be tested for bacteria and white blood cells. White blood cells are made by your body to help fight infection. TREATMENT  Typically, UTIs can be treated with medication. Because most UTIs are caused by a bacterial infection, they usually can be treated with the use of antibiotics. The choice of antibiotic and length of treatment depend on your symptoms and the type of bacteria causing your infection. HOME CARE INSTRUCTIONS  If you were prescribed antibiotics, take them exactly as your caregiver instructs you. Finish the medication even if you feel better after you have only taken some of the medication.  Drink enough water and fluids to keep your urine clear or pale yellow.  Avoid caffeine, tea, and carbonated beverages. They tend to irritate your bladder.  Empty your bladder often. Avoid holding urine for long periods of time.  Empty your  bladder before and after sexual intercourse.  After a bowel movement, women should cleanse from front to back. Use each tissue only once. SEEK MEDICAL CARE IF:   You have back pain.  You develop a fever.  Your symptoms do not begin to resolve within 3 days. SEEK IMMEDIATE MEDICAL CARE IF:   You have severe back pain or lower abdominal pain.  You develop chills.  You have nausea or vomiting.  You have continued burning or discomfort with urination. MAKE SURE YOU:   Understand these instructions.  Will watch your condition.  Will get help right away if you are not doing well or get worse. Document Released: 04/03/2005 Document Revised: 12/24/2011 Document Reviewed: 08/02/2011 [facility] [facility] Patient Information 2015 [place], [place]. This information is not intended to replace advice given to you by your health care provider. Make sure you discuss any questions you have with your health care provider.

## 2015-03-07 NOTE — ED Notes (Signed)
[facility] called for transport back to facility.

## 2015-03-07 NOTE — Progress Notes (Signed)
81 yr old female from [facility] as confirmed by pt, grand daughter and the grand daughter's Mother in law CM confirmed with grand daugther was "sent a letter in July"  " I appealed it for two weeks while I was out of town" "the last day is tomorrow, August thirty first"  Cm had asked about pt receiving an eviction notice from the ALF Grand daughter asked CM if Cm was "recommending pt be discharged?" Cm informed Grand daughter CM was "not recommending anything" but "informing her of medicare guidelines and asking questions to understand what is going on" Pt does not have medicaid but it has been applied for per grand daughter's Mother in law who confirms pt stay at ALF was "private pay". Mother states medicaid is "pendig" Both mother in law and grand daughter allowed time to ventilate feelings about "her not getting good care" and the condition of the facility mattress, bedding, rails and hoyer lift use When Cm asked why pt had not been placed in a snf after they described that the pt was primarily bed bound, the grand daughter's Mother in law stated "money talks" and rubbed her fingers together  The grand daughter's Mother in law stated the grand daughter had frequently complained about the care and facility issues prior to the eviction letter being provided  The grand daughter's Mother in law states "no one was helping her" Referring to the grand daughter "one of the techs tried to tell her about [facility]" CM explained to grand daughter that the EDPs are doing further tests to see if pt needs admission per medicare guidelines and if there is not a reason for admission pt could not remain in the emergency room indefinitely. Grand daughter stated pt has had increased back issues but pt has hx of chronic back pain with baseline "she does not walk" "can't walk" cxr = No active cardiopulmonary disease. Ct spine =No evidence for acute cervical spine fracture. Cm explained that if pt baseline was  with chronic back pain and baseline of not walking with no medical reason for admission pt still will not meet medicare guidelines for admission or remaining in the ED.   Both grand daughter and the grand daughter's Mother in law stated the pt could not go home with them when asked by CM after Cm inquired if CM could assist with home health services  The Grand daughter said "No" The grand daughter's mother in law said "no" CM inquired if grand daughter had an idea of a facility the pt could go to on 03/08/15 and was informed "I can try"  Cm inquired if she could find a place by 03/08/15 noon and she said "I don't know" The grand daughter's Mother in law states she "lives out of town" and today she was sitting with pt because the grand daughter was "going to get her stuff" Grand daughter states she will be coming to [facility] ED and asked if staff will be here Cm informed her she is welcome to come to ED

## 2015-03-07 NOTE — ED Notes (Signed)
Pt not in room at this time. Will check back later for labs.

## 2015-03-07 NOTE — ED Notes (Signed)
Bed: WA21 Expected date:  Expected time:  Means of arrival:  Comments: EMS- 81yo F, chronic back pain

## 2015-03-07 NOTE — Progress Notes (Signed)
CSW met with patient at bedside. Nurse CM and close family friend were present. Patient confirms that she presents to [facility] due to back pain.  Also, she confirms that she is from [place].  Family friend state that patient and family are interested in moving pt to a SNF. CSW explained to patient medicare guidelines. CSW offered patient the options of placement through private pay or hiring a private duty nurse to help at the facility.   CSW spoke with patient's granddaughter over the phone. She informed CSW that the patient has a 30 day eviction notice from the facility, and that tomorrow is her last day. CSW reached out to [facility] to confirm eviction. CSW spoke with patient's med tech who confirms that the patient is up for eviction. However, he state that the patient is welcomed to comeback tonight. He states that tomorrow will be the patient's last day.  According to Med tech, he states that the patient may be leaving due to possible financial issues.  Also, med tech states that the facility supervisor offered the patient and her family other options of placement throughout the facility. However, he says that the family did not like the options given.  Nurse CM offered the patient and her family home health services. However, granddaughter declined the offer.   Granddaughter/ Otila Kluver [phone]  Willette Brace [phone] ED CSW 03/07/2015 9:34 PM

## 2015-03-07 NOTE — ED Notes (Signed)
Per ems pt from [facility], co chronic back pain, Hx spinal surgery . Pt is bedridden , alert and oriented x 4. Pt is incontinent of urine normal to baseline. staff requested Ed evaluation of back pain that is worse today.

## 2015-03-09 LAB — URINE CULTURE: Culture: >=100000

## 2015-03-10 ENCOUNTER — Telehealth (HOSPITAL_COMMUNITY): Payer: Self-pay

## 2015-03-10 NOTE — Telephone Encounter (Signed)
Post ED Visit - Positive Culture Follow-up  Culture report reviewed by antimicrobial stewardship pharmacist:  Wes Dulaney, Pharm.D., BCPS  Celedonio Miyamoto, Pharm.D., BCPS  Georgina Pillion, Pharm.D., BCPS  [place], [place].D., BCPS, AAHIVP  Estella Husk, Pharm.D., BCPS, AAHIVP  [place], [address].D., BCPS X  [place], [address].D.  Positive Urine culture, >/= 100,000 colonies -> Klebsiella Pneumoniae Treated with Cephalexin, organism sensitive to the same and no further patient follow-up is required at this time.  Arvid Right 03/10/2015, 1:41 PM

## 2015-03-13 ENCOUNTER — Non-Acute Institutional Stay (SKILLED_NURSING_FACILITY): Payer: Medicare Other | Admitting: Adult Health

## 2015-03-13 DIAGNOSIS — F418 Other specified anxiety disorders: Secondary | ICD-10-CM | POA: Diagnosis not present

## 2015-03-13 DIAGNOSIS — E785 Hyperlipidemia, unspecified: Secondary | ICD-10-CM

## 2015-03-13 DIAGNOSIS — I10 Essential (primary) hypertension: Secondary | ICD-10-CM | POA: Diagnosis not present

## 2015-03-13 DIAGNOSIS — E038 Other specified hypothyroidism: Secondary | ICD-10-CM | POA: Diagnosis not present

## 2015-03-13 DIAGNOSIS — M47812 Spondylosis without myelopathy or radiculopathy, cervical region: Secondary | ICD-10-CM | POA: Diagnosis not present

## 2015-03-13 DIAGNOSIS — K5900 Constipation, unspecified: Secondary | ICD-10-CM

## 2015-03-13 DIAGNOSIS — B182 Chronic viral hepatitis C: Secondary | ICD-10-CM | POA: Diagnosis not present

## 2015-03-13 DIAGNOSIS — J302 Other seasonal allergic rhinitis: Secondary | ICD-10-CM | POA: Diagnosis not present

## 2015-03-13 DIAGNOSIS — G3109 Other frontotemporal dementia: Secondary | ICD-10-CM | POA: Diagnosis not present

## 2015-03-13 DIAGNOSIS — E034 Atrophy of thyroid (acquired): Secondary | ICD-10-CM

## 2015-03-13 DIAGNOSIS — F0280 Dementia in other diseases classified elsewhere without behavioral disturbance: Secondary | ICD-10-CM

## 2015-03-13 NOTE — Progress Notes (Signed)
Patient ID: Mallory Burke, female   DOB: [birth year], 81 y.o.   MRN: [id number]   Facility: [facility]      No Known Allergies  Chief Complaint  Patient presents with  . Hospitalization Follow-up    HPI:  She has transferred to this facility from assisted living for long term placement. She is unable to fully participate in the hpi or ros; but doe shave neck and back pain. She tells me that she has had this pain for a long time. There are no nursing concerns at this time.    Past Medical History  Diagnosis Date  . Chicken pox   . Hyperlipidemia   . Depression   . Hypothyroid   . Bronchitis     hx of  . Anemia   . Blood transfusion     hx of  . Hepatitis     Hx of  . Urinary frequency   . Urinary tract infection     hx of  . Arthritis     "legs, neck"  . Ulcer, stomach peptic     hx of  . Heart murmur     patient's primary Dr. Cranston Neighbor    Past Surgical History  Procedure Laterality Date  . Back surgery      x3, fusion and rod  . Abdominal hysterectomy    . Knee arthroscopy  2007    right  . Abdominal surgery    . Tubal ligation    . Dilation and curettage of uterus    . Breast lumpectomy      right side  . Posterior cervical fusion/foraminotomy  11/13/2011    Procedure: POSTERIOR CERVICAL FUSION/FORAMINOTOMY LEVEL 1;  Surgeon: Carmela Hurt, MD;  Location: [facility];  Service: Neurosurgery;  Laterality: N/A;  Cervical one Cervical two posterior transpedicular fusion with lateral mass screws    VITAL SIGNS BP 150/73 mmHg  Pulse 100  Wt 191 lb (86.637 kg)  Patient's Medications  New Prescriptions   No medications on file  Previous Medications   BENZONATATE (TESSALON) 200 MG CAPSULE    Take 200 mg by mouth every 6 (six) hours as needed for cough.   CITALOPRAM (CELEXA) 10 MG TABLET    Take 10 mg by mouth every morning.    CYANOCOBALAMIN (B-12) 1000 MCG SUBL    Place 1,000 mcg under the tongue daily.   DOCUSATE SODIUM (COLACE) 100 MG  CAPSULE    Take 100 mg by mouth daily.   DONEPEZIL (ARICEPT) 10 MG TABLET    Take 10 mg by mouth at bedtime.    FLUTICASONE (FLONASE) 50 MCG/ACT NASAL SPRAY    Place 2 sprays into both nostrils daily.   GABAPENTIN (NEURONTIN) 300 MG CAPSULE    Take 300 mg by mouth 3 (three) times daily.    HYDROCHLOROTHIAZIDE (,MICROZIDE/HYDRODIURIL,) 12.5 MG CAPSULE    Take 12.5 mg by mouth every morning.    HYDROCODONE-ACETAMINOPHEN (NORCO) 10-325 MG PER TABLET    Take 1 tablet by mouth 2 (two) times daily. AND daily as needed   LEVOTHYROXINE (SYNTHROID, LEVOTHROID) 75 MCG TABLET    Take 1 tablet (75 mcg total) by mouth daily.   LOPERAMIDE (IMODIUM) 2 MG CAPSULE    Take 2 mg by mouth 4 (four) times daily as needed for diarrhea or loose stools.   LORAZEPAM (ATIVAN) 0.5 MG TABLET    Take 1 tablet (0.5 mg total) by mouth 3 (three) times daily.   NYSTATIN OINTMENT (MYCOSTATIN)  Apply 1 application topically 2 (two) times daily.   OMEPRAZOLE (PRILOSEC) 20 MG CAPSULE    Take 20 mg by mouth daily.   PHENOL (CHLORASEPTIC) 1.4 % LIQD    Use as directed 2 sprays in the mouth or throat as needed (for sore throat).   POLYETHYLENE GLYCOL (MIRALAX / GLYCOLAX) PACKET    Take 17 g by mouth daily.   POTASSIUM CHLORIDE SA (K-DUR,KLOR-CON) 20 MEQ TABLET    Take 20 mEq by mouth daily with breakfast. Do not  Crush. Take with food.   ROSUVASTATIN (CRESTOR) 20 MG TABLET    Take 20 mg by mouth daily.   SENNA (SENOKOT) 8.6 MG TABS TABLET    Take 1 tablet by mouth at bedtime.   TIZANIDINE (ZANAFLEX) 2 MG TABLET    Take 2 mg by mouth 2 (two) times daily.  Modified Medications   No medications on file  Discontinued Medications     SIGNIFICANT DIAGNOSTIC EXAMS  03-07-15: chest x-ray: No active cardiopulmonary disease.  03-07-15: ct of cervical spine: No evidence for acute cervical spine fracture.   LABS REVIEWED:   03-07-15: wbc 4.4; hgb 14.9; hct 45.8; mcv 99.1; plt 211; glucose 93; bun 7; creat 0.68; k+4.1; na++141; liver  normal albumin 4.4; urine culture: klebsiella pneumoniae    Review of Systems  Unable to perform ROS: Dementia      Physical Exam  Vitals reviewed. Constitutional: No distress.  Overweight   Eyes: Conjunctivae are normal.  Neck: Neck supple. No JVD present. No thyromegaly present.  Cardiovascular: Normal rate, regular rhythm and intact distal pulses.   Respiratory: Effort normal and breath sounds normal. No respiratory distress. She has no wheezes.  GI: Soft. Bowel sounds are normal. She exhibits no distension. There is no tenderness.  Musculoskeletal: She exhibits no edema.  Able to move all extremities  Has limited range of motion in neck   Lymphadenopathy:    She has no cervical adenopathy.  Neurological: She is alert.  Skin: Skin is warm and dry. She is not diaphoretic.  Psychiatric: She has a normal mood and affect.      ASSESSMENT/ PLAN:  1. Hypothyroidism: will continue synthroid 75 mcg daily   2. Frontotemporal dementia: no change in status will continue aricept 10 mg daily her current weight is 191 pounds  3. Dyslipidemia: will continue crestor 20 mg daily   4. Hypertension: will continue hctz 12.5 mg daily   5. GERD: will continue prilosec 20 mg daily   6. Constipation: will continue colace daily; senna daily and miralax daily   7. Depression: will continue celexa 10 mg daily; will continue ativan 0.5 mg three times daily for anxiety   8. 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 Allergic rhinitis: will continue flonase daily   10. Viral hepatitis C without coma: no change in status; will monitor   Will check cbc; cmp lipids tsh free t4   Time spent with patient  50  minutes >50% time spent counseling; reviewing medical record; tests; labs; and developing future plan of  care     Synthia Innocent NP [facility]  Contact [phone] Monday through Friday 8am- 5pm  After hours call [phone]

## 2015-03-14 ENCOUNTER — Non-Acute Institutional Stay (SKILLED_NURSING_FACILITY): Payer: Medicare Other | Admitting: Internal Medicine

## 2015-03-14 DIAGNOSIS — E538 Deficiency of other specified B group vitamins: Secondary | ICD-10-CM | POA: Diagnosis not present

## 2015-03-14 DIAGNOSIS — G3109 Other frontotemporal dementia: Secondary | ICD-10-CM

## 2015-03-14 DIAGNOSIS — F329 Major depressive disorder, single episode, unspecified: Secondary | ICD-10-CM | POA: Diagnosis not present

## 2015-03-14 DIAGNOSIS — F32A Depression, unspecified: Secondary | ICD-10-CM

## 2015-03-14 DIAGNOSIS — K219 Gastro-esophageal reflux disease without esophagitis: Secondary | ICD-10-CM | POA: Diagnosis not present

## 2015-03-14 DIAGNOSIS — E785 Hyperlipidemia, unspecified: Secondary | ICD-10-CM

## 2015-03-14 DIAGNOSIS — E034 Atrophy of thyroid (acquired): Secondary | ICD-10-CM

## 2015-03-14 DIAGNOSIS — E038 Other specified hypothyroidism: Secondary | ICD-10-CM | POA: Diagnosis not present

## 2015-03-14 DIAGNOSIS — G894 Chronic pain syndrome: Secondary | ICD-10-CM

## 2015-03-14 DIAGNOSIS — F0280 Dementia in other diseases classified elsewhere without behavioral disturbance: Secondary | ICD-10-CM | POA: Diagnosis not present

## 2015-03-14 DIAGNOSIS — F411 Generalized anxiety disorder: Secondary | ICD-10-CM

## 2015-03-14 DIAGNOSIS — M2550 Pain in unspecified joint: Secondary | ICD-10-CM | POA: Diagnosis not present

## 2015-03-20 ENCOUNTER — Encounter: Payer: Self-pay | Admitting: Internal Medicine

## 2015-03-20 DIAGNOSIS — G3109 Other frontotemporal dementia: Secondary | ICD-10-CM

## 2015-03-20 DIAGNOSIS — M2550 Pain in unspecified joint: Secondary | ICD-10-CM | POA: Insufficient documentation

## 2015-03-20 DIAGNOSIS — F0280 Dementia in other diseases classified elsewhere without behavioral disturbance: Secondary | ICD-10-CM | POA: Insufficient documentation

## 2015-03-20 NOTE — Progress Notes (Signed)
Patient ID: Mallory Burke, female   DOB: Dec 02, 1932, 81 y.o.   MRN: [id number]    HISTORY AND PHYSICAL   DATE:  03/14/15  Location:  [facility]    Place of Service: SNF [phone])   Extended Emergency Contact Information Primary Emergency Contact: Edson Address: [place], [place] [address] [place] Phone: [phone] Mobile Phone: [phone] Relation: Grandaughter Secondary Emergency Contact: Anthonette [place], [place] Phone: [phone] Relation: Grandaughter  Advanced Directive information  [facility]  Chief Complaint  Patient presents with  . New Admit To SNF    HPI:  81 yo female seen today as a new admission into SNF following tx from ALF. She will be a long term resident. She c/o pain in her back and neck today. She is a poor historian due to dementia. Hx obtained from chart.  Dementia - stable on aricept  Anxiety/depression - stable on celexa. lorazepam  Hypothyroid - stable on levothyroxine  Arthritis/chronic pain - stable on gabapentin, norco and tizanidine.  b12 deficiency - stable on B12 tab daily  GERD/chronic abdominal pain - on PPI daily. constipation stable on bowel regiman (colace and senna)  Edema/ hypokalemia - takes Klor-con daily. Edema controled with low dose HCTZ  Hyperlipidemia - no myalgias on statin tx   Past Medical History  Diagnosis Date  . Chicken pox   . Hyperlipidemia   . Depression   . Hypothyroid   . Bronchitis     hx of  . Anemia   . Blood transfusion     hx of  . Hepatitis     Hx of  . Urinary frequency   . Urinary tract infection     hx of  . Arthritis     "legs, neck"  . Ulcer, stomach peptic     hx of  . Heart murmur     patient's primary Dr. Nestor Lewandowsky    Past Surgical History  Procedure Laterality Date  . Back surgery      x3, fusion and rod  . Abdominal hysterectomy    . Knee arthroscopy   2007    right  . Abdominal surgery    . Tubal ligation    . Dilation and curettage of uterus    . Breast lumpectomy      right side  . Posterior cervical fusion/foraminotomy  11/13/2011    Procedure: POSTERIOR CERVICAL FUSION/FORAMINOTOMY LEVEL 1;  Surgeon: Winfield Cunas, MD;  Location: [place];  Service: Neurosurgery;  Laterality: N/A;  Cervical one Cervical two posterior transpedicular fusion with lateral mass screws    No care team member to display  Social History   Social History  . Marital Status: Divorced    Spouse Name: N/A  . Number of Children: N/A  . Years of Education: N/A   Occupational History  . Not on file.   Social History Main Topics  . Smoking status: Never Smoker   . Smokeless tobacco: Not on file  . Alcohol Use: No  . Drug Use: No  . Sexual Activity: Not on file   Other Topics Concern  . Not on file   Social History Narrative     reports that she has never smoked. She does not have any smokeless tobacco history on file. She reports that she does not drink alcohol or use  illicit drugs.  Family History  Problem Relation Age of Onset  . Arthritis    . Hyperlipidemia    . Hypertension     No family status information on file.    Immunization History  Administered Date(s) Administered  . PPD Test 12/24/2010    No Known Allergies  Medications: Patient's Medications  New Prescriptions   No medications on file  Previous Medications   BENZONATATE (TESSALON) 200 MG CAPSULE    Take 200 mg by mouth every 6 (six) hours as needed for cough.   CITALOPRAM (CELEXA) 10 MG TABLET    Take 10 mg by mouth every morning.    CYANOCOBALAMIN (B-12) 1000 MCG SUBL    Place 1,000 mcg under the tongue daily.   DOCUSATE SODIUM (COLACE) 100 MG CAPSULE    Take 100 mg by mouth daily.   DONEPEZIL (ARICEPT) 10 MG TABLET    Take 10 mg by mouth at bedtime.    FLUTICASONE (FLONASE) 50 MCG/ACT NASAL SPRAY    Place 2 sprays into both nostrils daily.   GABAPENTIN  (NEURONTIN) 300 MG CAPSULE    Take 300 mg by mouth 3 (three) times daily.    HYDROCHLOROTHIAZIDE (,MICROZIDE/HYDRODIURIL,) 12.5 MG CAPSULE    Take 12.5 mg by mouth every morning.    HYDROCODONE-ACETAMINOPHEN (NORCO) 10-325 MG PER TABLET    Take 1 tablet by mouth 2 (two) times daily. AND daily as needed   LEVOTHYROXINE (SYNTHROID, LEVOTHROID) 75 MCG TABLET    Take 1 tablet (75 mcg total) by mouth daily.   LOPERAMIDE (IMODIUM) 2 MG CAPSULE    Take 2 mg by mouth 4 (four) times daily as needed for diarrhea or loose stools.   LORAZEPAM (ATIVAN) 0.5 MG TABLET    Take 1 tablet (0.5 mg total) by mouth 3 (three) times daily.   NYSTATIN OINTMENT (MYCOSTATIN)    Apply 1 application topically 2 (two) times daily.   OMEPRAZOLE (PRILOSEC) 20 MG CAPSULE    Take 20 mg by mouth daily.   PHENOL (CHLORASEPTIC) 1.4 % LIQD    Use as directed 2 sprays in the mouth or throat as needed (for sore throat).   POLYETHYLENE GLYCOL (MIRALAX / GLYCOLAX) PACKET    Take 17 g by mouth daily.   POTASSIUM CHLORIDE SA (K-DUR,KLOR-CON) 20 MEQ TABLET    Take 20 mEq by mouth daily with breakfast. Do not  Crush. Take with food.   ROSUVASTATIN (CRESTOR) 20 MG TABLET    Take 20 mg by mouth daily.   SENNA (SENOKOT) 8.6 MG TABS TABLET    Take 1 tablet by mouth at bedtime.   TIZANIDINE (ZANAFLEX) 2 MG TABLET    Take 2 mg by mouth 2 (two) times daily.  Modified Medications   No medications on file  Discontinued Medications   No medications on file    Review of Systems  Unable to perform ROS: Dementia    Filed Vitals:   03/14/15 1221  BP: 122/70  Pulse: 81  Temp: 98.5 F (36.9 C)  Weight: 191 lb (86.637 kg)   Body mass index is 31.78 kg/(m^2).  Physical Exam  Constitutional: She appears well-developed and well-nourished.  Sitting in w/c in NAD. Looks uncomfortable  HENT:  Mouth/Throat: Oropharynx is clear and moist. No oropharyngeal exudate.  Eyes: Pupils are equal, round, and reactive to light. No scleral icterus.  Neck:  Neck supple. Muscular tenderness present. No spinous process tenderness present. Carotid bruit is not present. Decreased range of motion present. No tracheal deviation  present. No thyromegaly present.  Cardiovascular: Normal rate, regular rhythm, normal heart sounds and intact distal pulses.  Exam reveals no gallop and no friction rub.   No murmur heard. No LE edema b/l. no calf TTP.   Pulmonary/Chest: Effort normal and breath sounds normal. No stridor. No respiratory distress. She has no wheezes. She has no rales.  Abdominal: Soft. Bowel sounds are normal. She exhibits no distension and no mass. There is no hepatomegaly. There is no tenderness. There is no rebound and no guarding.  Musculoskeletal: She exhibits edema and tenderness.  Reduced ROM b/l knee   Lymphadenopathy:    She has no cervical adenopathy.  Neurological: She is alert.  Skin: Skin is warm and dry. No rash noted.  Psychiatric: She has a normal mood and affect. Her behavior is normal. Her speech is slurred. Thought content is not paranoid and not delusional.     Labs reviewed: Admission on 03/07/2015, Discharged on 03/07/2015  Component Date Value Ref Range Status  . Sodium 03/07/2015 141  135 - 145 mmol/L Final  . Potassium 03/07/2015 4.1  3.5 - 5.1 mmol/L Final  . Chloride 03/07/2015 104  101 - 111 mmol/L Final  . CO2 03/07/2015 29  22 - 32 mmol/L Final  . Glucose, Bld 03/07/2015 93  65 - 99 mg/dL Final  . BUN 03/07/2015 7  6 - 20 mg/dL Final  . Creatinine, Ser 03/07/2015 0.68  0.44 - 1.00 mg/dL Final  . Calcium 03/07/2015 9.5  8.9 - 10.3 mg/dL Final  . Total Protein 03/07/2015 7.7  6.5 - 8.1 g/dL Final  . Albumin 03/07/2015 4.4  3.5 - 5.0 g/dL Final  . AST 03/07/2015 29  15 - 41 U/L Final  . ALT 03/07/2015 15  14 - 54 U/L Final  . Alkaline Phosphatase 03/07/2015 63  38 - 126 U/L Final  . Total Bilirubin 03/07/2015 1.5* 0.3 - 1.2 mg/dL Final  . GFR calc non Af Amer 03/07/2015 >60  >60 mL/min Final  . GFR calc Af  Amer 03/07/2015 >60  >60 mL/min Final   Comment: (NOTE) The eGFR has been calculated using the CKD EPI equation. This calculation has not been validated in all clinical situations. eGFR's persistently <60 mL/min signify possible Chronic Kidney Disease.   . Anion gap 03/07/2015 8  5 - 15 Final  . WBC 03/07/2015 4.4  4.0 - 10.5 K/uL Final  . RBC 03/07/2015 4.62  3.87 - 5.11 MIL/uL Final  . Hemoglobin 03/07/2015 14.9  12.0 - 15.0 g/dL Final  . HCT 03/07/2015 45.8  36.0 - 46.0 % Final  . MCV 03/07/2015 99.1  78.0 - 100.0 fL Final  . MCH 03/07/2015 32.3  26.0 - 34.0 pg Final  . MCHC 03/07/2015 32.5  30.0 - 36.0 g/dL Final  . RDW 03/07/2015 12.9  11.5 - 15.5 % Final  . Platelets 03/07/2015 211  150 - 400 K/uL Final  . Neutrophils Relative % 03/07/2015 34* 43 - 77 % Final  . Neutro Abs 03/07/2015 1.5* 1.7 - 7.7 K/uL Final  . Lymphocytes Relative 03/07/2015 59* 12 - 46 % Final  . Lymphs Abs 03/07/2015 2.6  0.7 - 4.0 K/uL Final  . Monocytes Relative 03/07/2015 6  3 - 12 % Final  . Monocytes Absolute 03/07/2015 0.3  0.1 - 1.0 K/uL Final  . Eosinophils Relative 03/07/2015 1  0 - 5 % Final  . Eosinophils Absolute 03/07/2015 0.0  0.0 - 0.7 K/uL Final  . Basophils Relative 03/07/2015 0  0 - 1 % Final  . Basophils Absolute 03/07/2015 0.0  0.0 - 0.1 K/uL Final  . Color, Urine 03/07/2015 YELLOW  YELLOW Final  . APPearance 03/07/2015 CLOUDY* CLEAR Final  . Specific Gravity, Urine 03/07/2015 1.010  1.005 - 1.030 Final  . pH 03/07/2015 7.0  5.0 - 8.0 Final  . Glucose, UA 03/07/2015 NEGATIVE  NEGATIVE mg/dL Final  . Hgb urine dipstick 03/07/2015 MODERATE* NEGATIVE Final  . Bilirubin Urine 03/07/2015 NEGATIVE  NEGATIVE Final  . Ketones, ur 03/07/2015 NEGATIVE  NEGATIVE mg/dL Final  . Protein, ur 03/07/2015 NEGATIVE  NEGATIVE mg/dL Final  . Urobilinogen, UA 03/07/2015 2.0* 0.0 - 1.0 mg/dL Final  . Nitrite 03/07/2015 NEGATIVE  NEGATIVE Final  . Leukocytes, UA 03/07/2015 LARGE* NEGATIVE Final  .  Specimen Description 03/07/2015 URINE, CATHETERIZED   Final  . Special Requests 03/07/2015 NONE   Final  . Culture 03/07/2015    Final                   Value:>=100,000 COLONIES/mL KLEBSIELLA PNEUMONIAE Performed at [facility]   . Report Status 03/07/2015 03/09/2015 FINAL   Final  . Organism ID, Bacteria 03/07/2015 KLEBSIELLA PNEUMONIAE   Final  . Squamous Epithelial / LPF 03/07/2015 RARE  RARE Final  . WBC, UA 03/07/2015 TOO NUMEROUS TO COUNT  <3 WBC/hpf Final  . Bacteria, UA 03/07/2015 MANY* RARE Final    Dg Chest 1 View  03/07/2015   CLINICAL DATA:  Chronic back pain  EXAM: CHEST  1 VIEW  COMPARISON:  December 09, 2013  FINDINGS: The heart size and mediastinal contours are stable. The aorta is tortuous. Heart size is enlarged. Both lungs are clear. The visualized skeletal structures are unremarkable.  IMPRESSION: No active cardiopulmonary disease.   Electronically Signed   By: Abelardo Diesel M.D.   On: 03/07/2015 18:13   Ct Cervical Spine Wo Contrast  03/07/2015   CLINICAL DATA:  Patient with chronic back pain. Fall 1 week prior. Neck pain. Initial encounter.  EXAM: CT CERVICAL SPINE WITHOUT CONTRAST  TECHNIQUE: Multidetector CT imaging of the cervical spine was performed without intravenous contrast. Multiplanar CT image reconstructions were also generated.  COMPARISON:  CT cervical spine 05/10/2013  FINDINGS: Relative straightening of the normal cervical lordosis. Multilevel degenerative disc disease. Preservation of the vertebral body heights. Craniocervical junction intact. Postprocedural changes compatible with C1-2 fusion.  IMPRESSION: No evidence for acute cervical spine fracture.   Electronically Signed   By: Lovey Newcomer M.D.   On: 03/07/2015 18:18     Assessment/Plan   ICD-9-CM ICD-10-CM   1. Chronic pain syndrome due to #2 338.4 G89.4   2. Pain in joint involving multiple sites 719.49 M25.50   3. Frontotemporal dementia - stable 331.19 G31.09     F02.80   4. Hypothyroidism  due to acquired atrophy of thyroid - stable 244.8 E03.8    246.8 E03.4   5. Depression - stable 311 F32.9   6. Generalized anxiety disorder - stable 300.02 F41.1   7. Vitamin B12 deficiency - stable 266.2 E53.8   8. Gastroesophageal reflux disease, esophagitis presence not specified - stable 530.81 K21.9   9. Hyperlipidemia - stable 272.4 E78.5     --cont current meds as ordered  --PT/OT/ST as ordered  --GOAL: long term placement. Communicated with pt and nursing.  --will follow  Ivanka Kirshner S. Perlie Gold  [facility] [address] [place], [place] [address] [phone]  Cell (Monday-Friday 8 AM - 5 PM) [phone] After 5 PM and follow prompts

## 2015-03-27 ENCOUNTER — Non-Acute Institutional Stay (SKILLED_NURSING_FACILITY): Payer: Medicare Other | Admitting: Adult Health

## 2015-03-27 DIAGNOSIS — E785 Hyperlipidemia, unspecified: Secondary | ICD-10-CM

## 2015-03-27 DIAGNOSIS — E038 Other specified hypothyroidism: Secondary | ICD-10-CM

## 2015-03-27 DIAGNOSIS — E034 Atrophy of thyroid (acquired): Secondary | ICD-10-CM

## 2015-03-27 DIAGNOSIS — M47812 Spondylosis without myelopathy or radiculopathy, cervical region: Secondary | ICD-10-CM | POA: Diagnosis not present

## 2015-04-10 ENCOUNTER — Non-Acute Institutional Stay (SKILLED_NURSING_FACILITY): Payer: Medicare Other | Admitting: Adult Health

## 2015-04-10 DIAGNOSIS — G3109 Other frontotemporal dementia: Secondary | ICD-10-CM | POA: Diagnosis not present

## 2015-04-10 DIAGNOSIS — M47812 Spondylosis without myelopathy or radiculopathy, cervical region: Secondary | ICD-10-CM | POA: Diagnosis not present

## 2015-04-10 DIAGNOSIS — F418 Other specified anxiety disorders: Secondary | ICD-10-CM

## 2015-04-10 DIAGNOSIS — E038 Other specified hypothyroidism: Secondary | ICD-10-CM

## 2015-04-10 DIAGNOSIS — E785 Hyperlipidemia, unspecified: Secondary | ICD-10-CM

## 2015-04-10 DIAGNOSIS — J302 Other seasonal allergic rhinitis: Secondary | ICD-10-CM

## 2015-04-10 DIAGNOSIS — I10 Essential (primary) hypertension: Secondary | ICD-10-CM | POA: Diagnosis not present

## 2015-04-10 DIAGNOSIS — E034 Atrophy of thyroid (acquired): Secondary | ICD-10-CM | POA: Diagnosis not present

## 2015-04-10 DIAGNOSIS — F0280 Dementia in other diseases classified elsewhere without behavioral disturbance: Secondary | ICD-10-CM

## 2015-04-10 DIAGNOSIS — B182 Chronic viral hepatitis C: Secondary | ICD-10-CM

## 2015-04-10 DIAGNOSIS — K5901 Slow transit constipation: Secondary | ICD-10-CM | POA: Diagnosis not present

## 2015-05-09 DIAGNOSIS — E785 Hyperlipidemia, unspecified: Secondary | ICD-10-CM | POA: Insufficient documentation

## 2015-05-09 DIAGNOSIS — J302 Other seasonal allergic rhinitis: Secondary | ICD-10-CM | POA: Insufficient documentation

## 2015-05-09 DIAGNOSIS — I10 Essential (primary) hypertension: Secondary | ICD-10-CM | POA: Insufficient documentation

## 2015-05-09 DIAGNOSIS — F418 Other specified anxiety disorders: Secondary | ICD-10-CM | POA: Insufficient documentation

## 2015-05-15 ENCOUNTER — Encounter: Payer: Self-pay | Admitting: Adult Health

## 2015-05-15 MED ORDER — TIZANIDINE HCL 2 MG PO TABS: 2.0000 mg | ORAL_TABLET | Freq: Three times a day (TID) | ORAL | Status: AC

## 2015-05-15 MED ORDER — LEVOTHYROXINE SODIUM 100 MCG PO TABS: 100.0000 ug | ORAL_TABLET | Freq: Every day | ORAL | Status: DC

## 2015-05-15 MED ORDER — ROSUVASTATIN CALCIUM 20 MG PO TABS: 10.0000 mg | ORAL_TABLET | Freq: Every day | ORAL | Status: DC

## 2015-05-15 NOTE — Progress Notes (Signed)
Patient ID: Mallory Burke, female   DOB: 05-29-1933, 81 y.o.   MRN: [id number]   [facility] [place]      No Known Allergies  Chief Complaint  Patient presents with  . Acute Visit    patient concerns and lab results      HPI:  She is unable to fully participate in the hpi or ros; but is telling that her back hurts. She did tell that she has had back pain "all her life". Her tsh is elevated and her ldl is low. Her synthroid will need to adjusted and will her crestor can be lowered.    Past Medical History  Diagnosis Date  . Chicken pox   . Hyperlipidemia   . Depression   . Hypothyroid   . Bronchitis     hx of  . Anemia   . Blood transfusion     hx of  . Hepatitis     Hx of  . Urinary frequency   . Urinary tract infection     hx of  . Arthritis     "legs, neck"  . Ulcer, stomach peptic     hx of  . Heart murmur     patient's primary Dr. Cranston Neighborabari    Past Surgical History  Procedure Laterality Date  . Back surgery      x3, fusion and rod  . Abdominal hysterectomy    . Knee arthroscopy  2007    right  . Abdominal surgery    . Tubal ligation    . Dilation and curettage of uterus    . Breast lumpectomy      right side  . Posterior cervical fusion/foraminotomy  11/13/2011    Procedure: POSTERIOR CERVICAL FUSION/FORAMINOTOMY LEVEL 1;  Surgeon: Carmela HurtKyle L Cabbell, MD;  Location: [facility];  Service: Neurosurgery;  Laterality: N/A;  Cervical one Cervical two posterior transpedicular fusion with lateral mass screws    VITAL SIGNS BP 138/88 mmHg  Pulse 85  Ht 5\' 3"  (1.6 m)  Wt 189 lb 12.8 oz (86.093 kg)  BMI 33.63 kg/m2  Patient's Medications  New Prescriptions   No medications on file  Previous Medications   BENZONATATE (TESSALON) 200 MG CAPSULE    Take 200 mg by mouth every 6 (six) hours as needed for cough.   CITALOPRAM (CELEXA) 10 MG TABLET    Take 10 mg by mouth every morning.    CYANOCOBALAMIN (B-12) 1000 MCG SUBL    Place 1,000 mcg under  the tongue daily.   DOCUSATE SODIUM (COLACE) 100 MG CAPSULE    Take 100 mg by mouth daily.   DONEPEZIL (ARICEPT) 10 MG TABLET    Take 10 mg by mouth at bedtime.    FLUTICASONE (FLONASE) 50 MCG/ACT NASAL SPRAY    Place 2 sprays into both nostrils daily.   GABAPENTIN (NEURONTIN) 300 MG CAPSULE    Take 300 mg by mouth 3 (three) times daily.    HYDROCHLOROTHIAZIDE (,MICROZIDE/HYDRODIURIL,) 12.5 MG CAPSULE    Take 12.5 mg by mouth every morning.    HYDROCODONE-ACETAMINOPHEN (NORCO) 10-325 MG PER TABLET    Take 1 tablet by mouth at bedtime.    LEVOTHYROXINE (SYNTHROID, LEVOTHROID) 75 MCG TABLET    Take 1 tablet (75 mcg total) by mouth daily.   LOPERAMIDE (IMODIUM) 2 MG CAPSULE    Take 2 mg by mouth 4 (four) times daily as needed for diarrhea or loose stools.   LORAZEPAM (ATIVAN) 0.5 MG TABLET    Take 1  tablet (0.5 mg total) by mouth 3 (three) times daily.   OMEPRAZOLE (PRILOSEC) 20 MG CAPSULE    Take 20 mg by mouth daily.   PHENOL (CHLORASEPTIC) 1.4 % LIQD    Use as directed 2 sprays in the mouth or throat as needed (for sore throat).   POLYETHYLENE GLYCOL (MIRALAX / GLYCOLAX) PACKET    Take 17 g by mouth daily.   POTASSIUM CHLORIDE SA (K-DUR,KLOR-CON) 20 MEQ TABLET    Take 20 mEq by mouth daily with breakfast. Do not  Crush. Take with food.   ROSUVASTATIN (CRESTOR) 20 MG TABLET    Take 20 mg by mouth daily.   SENNA (SENOKOT) 8.6 MG TABS TABLET    Take 1 tablet by mouth at bedtime.   TIZANIDINE (ZANAFLEX) 2 MG TABLET    Take 2 mg by mouth 2 (two) times daily.  Modified Medications   No medications on file  Discontinued Medications   NYSTATIN OINTMENT (MYCOSTATIN)    Apply 1 application topically 2 (two) times daily.     SIGNIFICANT DIAGNOSTIC EXAMS   03-07-15: chest x-ray: No active cardiopulmonary disease.  03-07-15: ct of cervical spine: No evidence for acute cervical spine fracture.   LABS REVIEWED:   03-07-15: wbc 4.4; hgb 14.9; hct 45.8; mcv 99.1; plt 211; glucose 93; bun 7; creat 0.68;  k+4.1; na++141; liver normal albumin 4.4; urine culture: klebsiella pneumoniae  03-19-15: wbc 4.4; hgb 13.7; hct 41.2; mcv 95.8; plt 202; glucose 81; bun 9; creat 0.65; k+ 3.5; na++140; t bili 1.5; albumin 3.8; chol 113; ldl 49; trig 56; hdl 53; tsh 8.640 free t4: 1.0      Review of Systems Unable to perform ROS: Dementia    Physical Exam Vitals reviewed. Constitutional: No distress.  Overweight   Eyes: Conjunctivae are normal.  Neck: Neck supple. No JVD present. No thyromegaly present.  Cardiovascular: Normal rate, regular rhythm and intact distal pulses.   Respiratory: Effort normal and breath sounds normal. No respiratory distress. She has no wheezes.  GI: Soft. Bowel sounds are normal. She exhibits no distension. There is no tenderness.  Musculoskeletal: She exhibits no edema.  Able to move all extremities  Has limited range of motion in neck   Lymphadenopathy:    She has no cervical adenopathy.  Neurological: She is alert.  Skin: Skin is warm and dry. She is not diaphoretic.  Psychiatric: She has a normal mood and affect.       ASSESSMENT/ PLAN:  1. Hypothyroidism: will increase synthroid 100 mcg daily and will check tsh; free t4 in 6 weeks; her tsh is 8.640   2. Dyslipidemia: will lower her crestor to 10 mg daily her ldl is 49; will check lipids in 6 weeksl  3. 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

## 2015-05-22 ENCOUNTER — Non-Acute Institutional Stay (SKILLED_NURSING_FACILITY): Payer: Medicare Other | Admitting: Adult Health

## 2015-05-22 DIAGNOSIS — F418 Other specified anxiety disorders: Secondary | ICD-10-CM | POA: Diagnosis not present

## 2015-05-22 DIAGNOSIS — E038 Other specified hypothyroidism: Secondary | ICD-10-CM

## 2015-05-22 DIAGNOSIS — F0280 Dementia in other diseases classified elsewhere without behavioral disturbance: Secondary | ICD-10-CM | POA: Diagnosis not present

## 2015-05-22 DIAGNOSIS — K5901 Slow transit constipation: Secondary | ICD-10-CM | POA: Diagnosis not present

## 2015-05-22 DIAGNOSIS — B182 Chronic viral hepatitis C: Secondary | ICD-10-CM | POA: Diagnosis not present

## 2015-05-22 DIAGNOSIS — M47812 Spondylosis without myelopathy or radiculopathy, cervical region: Secondary | ICD-10-CM | POA: Diagnosis not present

## 2015-05-22 DIAGNOSIS — G3109 Other frontotemporal dementia: Secondary | ICD-10-CM | POA: Diagnosis not present

## 2015-05-22 DIAGNOSIS — E785 Hyperlipidemia, unspecified: Secondary | ICD-10-CM | POA: Diagnosis not present

## 2015-05-22 DIAGNOSIS — J302 Other seasonal allergic rhinitis: Secondary | ICD-10-CM

## 2015-05-22 DIAGNOSIS — E034 Atrophy of thyroid (acquired): Secondary | ICD-10-CM | POA: Diagnosis not present

## 2015-05-22 DIAGNOSIS — I10 Essential (primary) hypertension: Secondary | ICD-10-CM | POA: Diagnosis not present

## 2015-06-01 ENCOUNTER — Encounter: Payer: Self-pay | Admitting: Adult Health

## 2015-06-01 NOTE — Progress Notes (Signed)
Patient ID: Mallory Burke, female   DOB: [birth year], 81 y.o.   MRN: [id number]    [facility]      No Known Allergies  Chief Complaint  Patient presents with  . Medical Management of Chronic Issues    HPI:  She is a long term resident of this facility being seen for the management of her chronic illnesses. Overall there is little change in her status. She is unable to fully participate in the hpi or ros; but says that she is "ok". There are no nursing concerns at this time. Her current weight is 187 pounds.    Past Medical History  Diagnosis Date  . Chicken pox   . Hyperlipidemia   . Depression   . Hypothyroid   . Bronchitis     hx of  . Anemia   . Blood transfusion     hx of  . Hepatitis     Hx of  . Urinary frequency   . Urinary tract infection     hx of  . Arthritis     "legs, neck"  . Ulcer, stomach peptic     hx of  . Heart murmur     patient's primary Dr. Cranston Neighbor    Past Surgical History  Procedure Laterality Date  . Back surgery      x3, fusion and rod  . Abdominal hysterectomy    . Knee arthroscopy  2007    right  . Abdominal surgery    . Tubal ligation    . Dilation and curettage of uterus    . Breast lumpectomy      right side  . Posterior cervical fusion/foraminotomy  11/13/2011    Procedure: POSTERIOR CERVICAL FUSION/FORAMINOTOMY LEVEL 1;  Surgeon: Carmela Hurt, MD;  Location: [facility];  Service: Neurosurgery;  Laterality: N/A;  Cervical one Cervical two posterior transpedicular fusion with lateral mass screws    VITAL SIGNS BP 140/84 mmHg  Pulse 78  Ht  (1.6 m)  Wt 187 lb (84.823 kg)  BMI 33.13 kg/m2  Patient's Medications  New Prescriptions   No medications on file  Previous Medications   ATORVASTATIN (LIPITOR) 20 MG TABLET    Take 20 mg by mouth daily at 6 PM.   BENZONATATE (TESSALON) 200 MG CAPSULE    Take 200 mg by mouth every 6 (six) hours as needed for cough.   CITALOPRAM (CELEXA) 10 MG TABLET    Take 10  mg by mouth every morning.    CYANOCOBALAMIN (B-12) 1000 MCG SUBL    Place 1,000 mcg under the tongue daily.   DOCUSATE SODIUM (COLACE) 100 MG CAPSULE    Take 100 mg by mouth daily.   DONEPEZIL (ARICEPT) 10 MG TABLET    Take 10 mg by mouth at bedtime.    FLUTICASONE (FLONASE) 50 MCG/ACT NASAL SPRAY    Place 2 sprays into both nostrils daily.   GABAPENTIN (NEURONTIN) 300 MG CAPSULE    Take 300 mg by mouth 3 (three) times daily.    HYDROCHLOROTHIAZIDE (,MICROZIDE/HYDRODIURIL,) 12.5 MG CAPSULE    Take 12.5 mg by mouth every morning.    HYDROCODONE-ACETAMINOPHEN (NORCO) 10-325 MG TABLET    Take 1 tablet by mouth at bedtime. AND twice daily as needed   LEVOTHYROXINE (SYNTHROID, LEVOTHROID) 125 MCG TABLET    Take 1 tablet (125 mcg total) by mouth daily.   LOPERAMIDE (IMODIUM) 2 MG CAPSULE    Take 2 mg by mouth 4 (four) times daily as  needed for diarrhea or loose stools.   LORAZEPAM (ATIVAN) 0.5 MG TABLET    Take 1 tablet (0.5 mg total) by mouth 3 (three) times daily.   OMEPRAZOLE (PRILOSEC) 20 MG CAPSULE    Take 20 mg by mouth daily.   PHENOL (CHLORASEPTIC) 1.4 % LIQD    Use as directed 2 sprays in the mouth or throat as needed (for sore throat).   POLYETHYLENE GLYCOL (MIRALAX / GLYCOLAX) PACKET    Take 17 g by mouth daily.   POTASSIUM CHLORIDE SA (K-DUR,KLOR-CON) 20 MEQ TABLET    Take 20 mEq by mouth daily with breakfast. Do not  Crush. Take with food.   SENNA (SENOKOT) 8.6 MG TABS TABLET    Take 1 tablet by mouth at bedtime.   TIZANIDINE (ZANAFLEX) 2 MG TABLET    Take 1 tablet (2 mg total) by mouth 3 (three) times daily.  Modified Medications   No medications on file  Discontinued Medications     SIGNIFICANT DIAGNOSTIC EXAMS   03-07-15: chest x-ray: No active cardiopulmonary disease.  03-07-15: ct of cervical spine: No evidence for acute cervical spine fracture.   LABS REVIEWED:   03-07-15: wbc 4.4; hgb 14.9; hct 45.8; mcv 99.1; plt 211; glucose 93; bun 7; creat 0.68; k+4.1; na++141; liver  normal albumin 4.4; urine culture: klebsiella pneumoniae  03-19-15: wbc 4.4; hgb 13.7; hct 41.2; mcv 95.8; plt 202; glucose 81; bun 9; creat 0.65; k+ 3.5; na++140; t bili 1.5; albumin 3.8; chol 113; ldl 49; trig 56; hdl 53; tsh 8.640 free t4: 1.0  04-16-15: urine culture: e-coli: amoxicillin 05-08-15: chol 103; ldl 2; trig 59; hdl 39; tsh 0.037; free t4: 2.26 05-17-15: tsh <0.008      Review of Systems Unable to perform ROS: Dementia    Physical Exam Vitals reviewed. Constitutional: No distress.  Overweight   Eyes: Conjunctivae are normal.  Neck: Neck supple. No JVD present. No thyromegaly present.  Cardiovascular: Normal rate, regular rhythm and intact distal pulses.   Respiratory: Effort normal and breath sounds normal. No respiratory distress. She has no wheezes.  GI: Soft. Bowel sounds are normal. She exhibits no distension. There is no tenderness.  Musculoskeletal: She exhibits no edema.  Able to move all extremities  Has limited range of motion in neck   Lymphadenopathy:    She has no cervical adenopathy.  Neurological: She is alert.  Skin: Skin is warm and dry. She is not diaphoretic.  Psychiatric: She has a normal mood and affect.       ASSESSMENT/ PLAN:  1. Hypothyroidism: she has had dosing changes made. Will return her to synthroid 100 mcg daily her tsh is <0.008. Will check her thyoid labs in one month.   2. Dyslipidemia: she was changed to lipitor. Her ldl is 59; will lower her lipitor to 10 mg daily and will check lipids in 2 months.   3. 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 Frontotemporal dementia: no change in status will continue aricept 10 mg daily her current weight is 191 pounds  5. Hypertension: will continue hctz 12.5 mg daily    6. GERD: will continue prilosec 20 mg daily   7. Constipation: will continue colace daily; senna daily and miralax daily   8. Depression: will continue  celexa 10 mg daily; will continue ativan 0.5 mg three times daily as needed for anxiety   9. 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 Allergic rhinitis: will continue flonase daily   11. Viral hepatitis C without coma: no change in status; will monitor       Synthia Innocent NP [facility]  Contact [phone] Monday through Friday 8am- 5pm  After hours call [phone]

## 2015-06-01 NOTE — Progress Notes (Signed)
Patient ID: Mallory Burke, female   DOB: 1933-04-12, 81 y.o.   MRN: [id number]   Facility: [facility]      No Known Allergies  Chief Complaint  Patient presents with  . Medical Management of Chronic Issues    HPI: She is a long term resident of this facility being seen for the management of her chronic illnesses. Overall there is little change in her status.  She is unable to fully participate in the hpi or ros; but tells me that she is feeling good. There are no nursing concerns at this time.     Past Medical History  Diagnosis Date  . Chicken pox   . Hyperlipidemia   . Depression   . Hypothyroid   . Bronchitis     hx of  . Anemia   . Blood transfusion     hx of  . Hepatitis     Hx of  . Urinary frequency   . Urinary tract infection     hx of  . Arthritis     "legs, neck"  . Ulcer, stomach peptic     hx of  . Heart murmur     patient's primary Dr. Cranston Neighbor    Past Surgical History  Procedure Laterality Date  . Back surgery      x3, fusion and rod  . Abdominal hysterectomy    . Knee arthroscopy  2007    right  . Abdominal surgery    . Tubal ligation    . Dilation and curettage of uterus    . Breast lumpectomy      right side  . Posterior cervical fusion/foraminotomy  11/13/2011    Procedure: POSTERIOR CERVICAL FUSION/FORAMINOTOMY LEVEL 1;  Surgeon: Carmela Hurt, MD;  Location: [facility];  Service: Neurosurgery;  Laterality: N/A;  Cervical one Cervical two posterior transpedicular fusion with lateral mass screws    VITAL SIGNS BP 138/83 mmHg  Pulse 85  Ht  (1.6 m)  Wt 189 lb (85.73 kg)  BMI 33.49 kg/m2  Patient's Medications  New Prescriptions   No medications on file  Previous Medications   BENZONATATE (TESSALON) 200 MG CAPSULE    Take 200 mg by mouth every 6 (six) hours as needed for cough.   CITALOPRAM (CELEXA) 10 MG TABLET    Take 10 mg by mouth every morning.    CYANOCOBALAMIN (B-12) 1000 MCG SUBL    Place 1,000 mcg under the tongue daily.     DOCUSATE SODIUM (COLACE) 100 MG CAPSULE    Take 100 mg by mouth daily.   DONEPEZIL (ARICEPT) 10 MG TABLET    Take 10 mg by mouth at bedtime.    FLUTICASONE (FLONASE) 50 MCG/ACT NASAL SPRAY    Place 2 sprays into both nostrils daily.   GABAPENTIN (NEURONTIN) 300 MG CAPSULE    Take 300 mg by mouth 3 (three) times daily.    HYDROCHLOROTHIAZIDE (,MICROZIDE/HYDRODIURIL,) 12.5 MG CAPSULE    Take 12.5 mg by mouth every morning.    HYDROCODONE-ACETAMINOPHEN (NORCO) 10-325 MG TABLET    Take 1 tablet by mouth at bedtime. AND twice daily as needed   LEVOTHYROXINE (SYNTHROID, LEVOTHROID) 100 MCG TABLET    Take 1 tablet (100 mcg total) by mouth daily.   LOPERAMIDE (IMODIUM) 2 MG CAPSULE    Take 2 mg by mouth 4 (four) times daily as needed for diarrhea or loose stools.   LORAZEPAM (ATIVAN) 0.5 MG TABLET    Take 1 tablet (0.5 mg total) by  mouth 3 (three) times daily.   OMEPRAZOLE (PRILOSEC) 20 MG CAPSULE    Take 20 mg by mouth daily.   PHENOL (CHLORASEPTIC) 1.4 % LIQD    Use as directed 2 sprays in the mouth or throat as needed (for sore throat).   POLYETHYLENE GLYCOL (MIRALAX / GLYCOLAX) PACKET    Take 17 g by mouth daily.   POTASSIUM CHLORIDE SA (K-DUR,KLOR-CON) 20 MEQ TABLET    Take 20 mEq by mouth daily with breakfast. Do not  Crush. Take with food.   ROSUVASTATIN (CRESTOR) 20 MG TABLET    Take 0.5 tablets (10 mg total) by mouth daily.   SENNA (SENOKOT) 8.6 MG TABS TABLET    Take 1 tablet by mouth at bedtime.   TIZANIDINE (ZANAFLEX) 2 MG TABLET    Take 1 tablet (2 mg total) by mouth 3 (three) times daily.  Modified Medications   No medications on file  Discontinued Medications     SIGNIFICANT DIAGNOSTIC EXAMS   03-07-15: chest x-ray: No active cardiopulmonary disease.  03-07-15: ct of cervical spine: No evidence for acute cervical spine fracture.   LABS REVIEWED:   03-07-15: wbc 4.4; hgb 14.9; hct 45.8; mcv 99.1; plt 211; glucose 93; bun 7; creat 0.68; k+4.1; na++141; liver normal albumin 4.4;  urine culture: klebsiella pneumoniae  03-19-15: wbc 4.4; hgb 13.7; hct 41.2; mcv 95.8; plt 202; glucose 81; bun 9; creat 0.65; k+ 3.5; na++140; t bili 1.5; albumin 3.8; chol 113; ldl 49; trig 56; hdl 53; tsh 8.640 free t4: 1.0      Review of Systems Unable to perform ROS: Dementia    Physical Exam Vitals reviewed. Constitutional: No distress.  Overweight   Eyes: Conjunctivae are normal.  Neck: Neck supple. No JVD present. No thyromegaly present.  Cardiovascular: Normal rate, regular rhythm and intact distal pulses.   Respiratory: Effort normal and breath sounds normal. No respiratory distress. She has no wheezes.  GI: Soft. Bowel sounds are normal. She exhibits no distension. There is no tenderness.  Musculoskeletal: She exhibits no edema.  Able to move all extremities  Has limited range of motion in neck   Lymphadenopathy:    She has no cervical adenopathy.  Neurological: She is alert.  Skin: Skin is warm and dry. She is not diaphoretic.  Psychiatric: She has a normal mood and affect.       ASSESSMENT/ PLAN:  1. Hypothyroidism: will  Continue synthroid 100 mcg daily  her tsh is 8.640   She is due for her follow up labs   2. Dyslipidemia: will lower her crestor to 5 mg daily her ldl is 49; will check lipids in 6 weeks  3. 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 Frontotemporal dementia: no change in status will continue aricept 10 mg daily her current weight is 191 pounds  5. Hypertension: will continue hctz 12.5 mg daily   6. GERD: will continue prilosec 20 mg daily   7. Constipation: will continue colace daily; senna daily and miralax daily   8. Depression: will continue celexa 10 mg daily; will continue ativan 0.5 mg three times daily as needed for  anxiety   9. 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 Allergic rhinitis: will continue flonase daily   11. Viral hepatitis C without coma: no change in status; will monitor     Synthia Innocenteborah Green NP [facility]  Contact [phone] Monday through Friday 8am- 5pm  After hours call [phone]

## 2015-06-12 LAB — LIPID PANEL
Cholesterol: 131 mg/dL (ref 0–200)
HDL: 51 mg/dL (ref 35–70)
LDL CALC: 66 mg/dL
TRIGLYCERIDES: 72 mg/dL (ref 40–160)

## 2015-06-12 LAB — HEPATIC FUNCTION PANEL
ALT: 8 U/L (ref 7–35)
AST: 16 U/L (ref 13–35)
Alkaline Phosphatase: 65 U/L (ref 25–125)
BILIRUBIN, TOTAL: 0.2 mg/dL

## 2015-06-21 LAB — TSH: TSH: 0.46 u[IU]/mL (ref 0.41–5.90)

## 2015-07-13 ENCOUNTER — Non-Acute Institutional Stay (SKILLED_NURSING_FACILITY): Payer: 59 | Admitting: Adult Health

## 2015-07-13 DIAGNOSIS — F0280 Dementia in other diseases classified elsewhere without behavioral disturbance: Secondary | ICD-10-CM | POA: Diagnosis not present

## 2015-07-13 DIAGNOSIS — J302 Other seasonal allergic rhinitis: Secondary | ICD-10-CM | POA: Diagnosis not present

## 2015-07-13 DIAGNOSIS — E038 Other specified hypothyroidism: Secondary | ICD-10-CM

## 2015-07-13 DIAGNOSIS — E785 Hyperlipidemia, unspecified: Secondary | ICD-10-CM

## 2015-07-13 DIAGNOSIS — B182 Chronic viral hepatitis C: Secondary | ICD-10-CM | POA: Diagnosis not present

## 2015-07-13 DIAGNOSIS — E034 Atrophy of thyroid (acquired): Secondary | ICD-10-CM

## 2015-07-13 DIAGNOSIS — M47812 Spondylosis without myelopathy or radiculopathy, cervical region: Secondary | ICD-10-CM

## 2015-07-13 DIAGNOSIS — I10 Essential (primary) hypertension: Secondary | ICD-10-CM | POA: Diagnosis not present

## 2015-07-13 DIAGNOSIS — G3109 Other frontotemporal dementia: Secondary | ICD-10-CM | POA: Diagnosis not present

## 2015-09-01 LAB — HM DIABETES FOOT EXAM

## 2015-09-03 ENCOUNTER — Encounter: Payer: Self-pay | Admitting: Adult Health

## 2015-09-03 NOTE — Progress Notes (Signed)
Patient ID: Mallory Burke, female   DOB: [birth year], 82 y.o.   MRN: [id number]   Facility: [facility]       No Known Allergies  Chief Complaint  Patient presents with  . Medical Management of Chronic Issues    HPI:  She is a long term resident of this facility being seen for the management of her chronic illnesses. Overall there is little change in her status. She is unable to fully participate in the hpi or ros; but does tell me that her back does hurt. There are no nursing concerns at this time.    Past Medical History  Diagnosis Date  . Chicken pox   . Hyperlipidemia   . Depression   . Hypothyroid   . Bronchitis     hx of  . Anemia   . Blood transfusion     hx of  . Hepatitis     Hx of  . Urinary frequency   . Urinary tract infection     hx of  . Arthritis     "legs, neck"  . Ulcer, stomach peptic     hx of  . Heart murmur     patient's primary Dr. Cranston Neighbor    Past Surgical History  Procedure Laterality Date  . Back surgery      x3, fusion and rod  . Abdominal hysterectomy    . Knee arthroscopy  2007    right  . Abdominal surgery    . Tubal ligation    . Dilation and curettage of uterus    . Breast lumpectomy      right side  . Posterior cervical fusion/foraminotomy  11/13/2011    Procedure: POSTERIOR CERVICAL FUSION/FORAMINOTOMY LEVEL 1;  Surgeon: Carmela Hurt, MD;  Location: [facility];  Service: Neurosurgery;  Laterality: N/A;  Cervical one Cervical two posterior transpedicular fusion with lateral mass screws    VITAL SIGNS BP 142/78 mmHg  Pulse 60  Ht  (1.6 m)  Wt 187 lb (84.823 kg)  BMI 33.13 kg/m2  Patient's Medications  New Prescriptions   No medications on file  Previous Medications   ATORVASTATIN (LIPITOR) 20 MG TABLET    Take 20 mg by mouth daily at 6 PM.   BENZONATATE (TESSALON) 200 MG CAPSULE    Take 200 mg by mouth every 6 (six) hours as needed for cough.   CITALOPRAM (CELEXA) 10 MG TABLET    Take 10 mg by mouth every  morning.    CYANOCOBALAMIN (B-12) 1000 MCG SUBL    Place 1,000 mcg under the tongue daily.   DOCUSATE SODIUM (COLACE) 100 MG CAPSULE    Take 100 mg by mouth daily.   DONEPEZIL (ARICEPT) 10 MG TABLET    Take 10 mg by mouth at bedtime.    FLUTICASONE (FLONASE) 50 MCG/ACT NASAL SPRAY    Place 2 sprays into both nostrils daily.   GABAPENTIN (NEURONTIN) 300 MG CAPSULE    Take 300 mg by mouth 3 (three) times daily.    HYDROCHLOROTHIAZIDE (,MICROZIDE/HYDRODIURIL,) 12.5 MG CAPSULE    Take 12.5 mg by mouth every morning.    HYDROCODONE-ACETAMINOPHEN (NORCO) 10-325 MG TABLET    Take 1 tablet by mouth at bedtime. AND twice daily as needed   LEVOTHYROXINE (SYNTHROID, LEVOTHROID) 100 MCG TABLET    Take 1 tablet (100 mcg total) by mouth daily.   LOPERAMIDE (IMODIUM) 2 MG CAPSULE    Take 2 mg by mouth 4 (four) times daily as needed for diarrhea  or loose stools.   LORAZEPAM (ATIVAN) 0.5 MG TABLET    Take 1 tablet (0.5 mg total) by mouth 3 (three) times daily.   OMEPRAZOLE (PRILOSEC) 20 MG CAPSULE    Take 20 mg by mouth daily.   PHENOL (CHLORASEPTIC) 1.4 % LIQD    Use as directed 2 sprays in the mouth or throat as needed (for sore throat).   POLYETHYLENE GLYCOL (MIRALAX / GLYCOLAX) PACKET    Take 17 g by mouth daily.   POTASSIUM CHLORIDE SA (K-DUR,KLOR-CON) 20 MEQ TABLET    Take 20 mEq by mouth daily with breakfast. Do not  Crush. Take with food.   SENNA (SENOKOT) 8.6 MG TABS TABLET    Take 1 tablet by mouth at bedtime.   TIZANIDINE (ZANAFLEX) 2 MG TABLET    Take 1 tablet (2 mg total) by mouth 3 (three) times daily.  Modified Medications   No medications on file  Discontinued Medications   No medications on file     SIGNIFICANT DIAGNOSTIC EXAMS   03-07-15: chest x-ray: No active cardiopulmonary disease.  03-07-15: ct of cervical spine: No evidence for acute cervical spine fracture.   LABS REVIEWED:   03-07-15: wbc 4.4; hgb 14.9; hct 45.8; mcv 99.1; plt 211; glucose 93; bun 7; creat 0.68; k+4.1;  na++141; liver normal albumin 4.4; urine culture: klebsiella pneumoniae  03-19-15: wbc 4.4; hgb 13.7; hct 41.2; mcv 95.8; plt 202; glucose 81; bun 9; creat 0.65; k+ 3.5; na++140; t bili 1.5; albumin 3.8; chol 113; ldl 49; trig 56; hdl 53; tsh 8.640 free t4: 1.0  04-16-15: urine culture: e-coli: amoxicillin 05-08-15: chol 103; ldl 2; trig 59; hdl 39; tsh 0.037; free t4: 2.26 05-17-15: tsh <0.008  06-12-15: liver normal albumin 3.4; chol 131; ldl 66; trig 72; hdl 51 06-21-15: tsh 0.464; free T4: 1.22      Review of Systems Unable to perform ROS: Dementia    Physical Exam Vitals reviewed. Constitutional: No distress.  Overweight   Eyes: Conjunctivae are normal.  Neck: Neck supple. No JVD present. No thyromegaly present.  Cardiovascular: Normal rate, regular rhythm and intact distal pulses.   Respiratory: Effort normal and breath sounds normal. No respiratory distress. She has no wheezes.  GI: Soft. Bowel sounds are normal. She exhibits no distension. There is no tenderness.  Musculoskeletal: She exhibits no edema.  Able to move all extremities  Has limited range of motion in neck   Lymphadenopathy:    She has no cervical adenopathy.  Neurological: She is alert.  Skin: Skin is warm and dry. She is not diaphoretic.  Psychiatric: She has a normal mood and affect.       ASSESSMENT/ PLAN:  1. Hypothyroidism: will continue  synthroid 100 mcg daily her tsh is 0.464 with free T4 1.22    2. Dyslipidemia:  Her ldl is 66; will continue  lipitor  10 mg daily.   3. 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 Frontotemporal dementia: no change in status will continue aricept 10 mg daily her current weight is 191 pounds  5. Hypertension: will continue hctz 12.5 mg daily    6. GERD: will continue prilosec 20 mg daily   7. Constipation: will continue colace daily; senna daily and miralax daily  8. Depression: will continue celexa 10 mg daily; will continue ativan 0.5 mg three times daily as needed for anxiety   9. 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 Allergic rhinitis: will continue flonase daily   11. Viral hepatitis C without coma: no change in status; will monitor         Synthia Innocent NP [facility]  Contact [phone] Monday through Friday 8am- 5pm  After hours call [phone]

## 2015-09-11 ENCOUNTER — Encounter: Payer: Self-pay | Admitting: Adult Health

## 2015-09-11 ENCOUNTER — Non-Acute Institutional Stay (SKILLED_NURSING_FACILITY): Payer: Medicare Other | Admitting: Adult Health

## 2015-09-11 DIAGNOSIS — E038 Other specified hypothyroidism: Secondary | ICD-10-CM | POA: Diagnosis not present

## 2015-09-11 DIAGNOSIS — K5901 Slow transit constipation: Secondary | ICD-10-CM | POA: Diagnosis not present

## 2015-09-11 DIAGNOSIS — R634 Abnormal weight loss: Secondary | ICD-10-CM | POA: Insufficient documentation

## 2015-09-11 DIAGNOSIS — B182 Chronic viral hepatitis C: Secondary | ICD-10-CM | POA: Diagnosis not present

## 2015-09-11 DIAGNOSIS — I10 Essential (primary) hypertension: Secondary | ICD-10-CM | POA: Diagnosis not present

## 2015-09-11 DIAGNOSIS — F418 Other specified anxiety disorders: Secondary | ICD-10-CM

## 2015-09-11 DIAGNOSIS — E785 Hyperlipidemia, unspecified: Secondary | ICD-10-CM | POA: Diagnosis not present

## 2015-09-11 DIAGNOSIS — E034 Atrophy of thyroid (acquired): Secondary | ICD-10-CM

## 2015-09-11 DIAGNOSIS — G3109 Other frontotemporal dementia: Secondary | ICD-10-CM | POA: Diagnosis not present

## 2015-09-11 DIAGNOSIS — F0280 Dementia in other diseases classified elsewhere without behavioral disturbance: Secondary | ICD-10-CM

## 2015-09-11 DIAGNOSIS — J302 Other seasonal allergic rhinitis: Secondary | ICD-10-CM | POA: Diagnosis not present

## 2015-09-11 DIAGNOSIS — M47812 Spondylosis without myelopathy or radiculopathy, cervical region: Secondary | ICD-10-CM | POA: Diagnosis not present

## 2015-09-11 NOTE — Progress Notes (Signed)
Patient ID: Mallory Burke, female   DOB: 12/22/1932, 82 y.o.   MRN: [id number]   Facility[facility]       No Known Allergies  Chief Complaint  Patient presents with  . Medical Management of Chronic Issues    Follow up    HPI:  She is a long term resident of this facility being seen for the management of her chronic illnesses. She is unable to fully participate in the hpi or ros; but does tell me that she is feels good today. There are no nursing concerns at this time. Her weight this month is 155.4 pounds which is 30 pounds less than the last documented weight of 187 pounds. Staff reports that she is eating 50% of her meals and is taking her supplements. I am concerned that this could be an aberrant weight; however; I will check lab work to assess her nutritional status.     Past Medical History  Diagnosis Date  . Chicken pox   . Hyperlipidemia   . Depression   . Hypothyroid   . Bronchitis     hx of  . Anemia   . Blood transfusion     hx of  . Hepatitis     Hx of  . Urinary frequency   . Urinary tract infection     hx of  . Arthritis     "legs, neck"  . Ulcer, stomach peptic     hx of  . Heart murmur     patient's primary Dr. Cranston Neighbor    Past Surgical History  Procedure Laterality Date  . Back surgery      x3, fusion and rod  . Abdominal hysterectomy    . Knee arthroscopy  2007    right  . Abdominal surgery    . Tubal ligation    . Dilation and curettage of uterus    . Breast lumpectomy      right side  . Posterior cervical fusion/foraminotomy  11/13/2011    Procedure: POSTERIOR CERVICAL FUSION/FORAMINOTOMY LEVEL 1;  Surgeon: Carmela Hurt, MD;  Location: [facility];  Service: Neurosurgery;  Laterality: N/A;  Cervical one Cervical two posterior transpedicular fusion with lateral mass screws    VITAL SIGNS BP 103/44 mmHg  Pulse 61  Temp(Src) 96.4 F (35.8 C) (Oral)  Resp 18  Ht  (1.6 m)  Wt 155 lb 4 oz (70.421 kg)  BMI 27.51  kg/m2  Patient's Medications  New Prescriptions   No medications on file  Previous Medications   ATORVASTATIN (LIPITOR) 20 MG TABLET    Take 20 mg by mouth daily at 6 PM.   BENZONATATE (TESSALON) 200 MG CAPSULE    Take 200 mg by mouth every 6 (six) hours as needed for cough.   CITALOPRAM (CELEXA) 10 MG TABLET    Take 10 mg by mouth every morning.    CYANOCOBALAMIN (B-12) 1000 MCG SUBL    Place 1,000 mcg under the tongue daily.   DONEPEZIL (ARICEPT) 10 MG TABLET    Take 10 mg by mouth at bedtime.    FLUTICASONE (FLONASE) 50 MCG/ACT NASAL SPRAY    Place 2 sprays into both nostrils daily.   GABAPENTIN (NEURONTIN) 300 MG CAPSULE    Take 300 mg by mouth 3 (three) times daily.    HYDROCHLOROTHIAZIDE (,MICROZIDE/HYDRODIURIL,) 12.5 MG CAPSULE    Take 12.5 mg by mouth every morning.    HYDROCODONE-ACETAMINOPHEN (NORCO) 10-325 MG TABLET    Take 1 tablet by mouth  at bedtime. AND twice daily as needed   LEVOTHYROXINE (SYNTHROID, LEVOTHROID) 100 MCG TABLET    Take 1 tablet (100 mcg total) by mouth daily.   LOPERAMIDE (IMODIUM) 2 MG CAPSULE    Take 2 mg by mouth 4 (four) times daily as needed for diarrhea or loose stools.   LORAZEPAM (ATIVAN) 0.5 MG TABLET    Take 1 tablet (0.5 mg total) by mouth 3 (three) times daily PRN   OMEPRAZOLE (PRILOSEC) 20 MG CAPSULE    Take 20 mg by mouth daily.   PHENOL (CHLORASEPTIC) 1.4 % LIQD    Use as directed 2 sprays in the mouth or throat as needed (for sore throat).   POLYETHYLENE GLYCOL (MIRALAX / GLYCOLAX) PACKET    Take 17 g by mouth daily.   POTASSIUM CHLORIDE SA (K-DUR,KLOR-CON) 20 MEQ TABLET    Take 20 mEq by mouth daily with breakfast. Do not  Crush. Take with food.   SENNA (SENOKOT) 8.6 MG TABS TABLET    Take 1 tablet by mouth at bedtime.   SKIN PROTECTANTS, MISC. (CALAZIME SKIN PROTECTANT) PSTE    Apply topically. Apply to sacrum topically every shift for routine barrier cream.   TIZANIDINE (ZANAFLEX) 2 MG TABLET    Take 1 tablet (2 mg total) by mouth 3 (three)  times daily.  Modified Medications   No medications on file  Discontinued Medications   DOCUSATE SODIUM (COLACE) 100 MG CAPSULE    Take 100 mg by mouth daily. Reported on 09/11/2015     SIGNIFICANT DIAGNOSTIC EXAMS  03-07-15: chest x-ray: No active cardiopulmonary disease.  03-07-15: ct of cervical spine: No evidence for acute cervical spine fracture.   LABS REVIEWED:   03-07-15: wbc 4.4; hgb 14.9; hct 45.8; mcv 99.1; plt 211; glucose 93; bun 7; creat 0.68; k+4.1; na++141; liver normal albumin 4.4; urine culture: klebsiella pneumoniae  03-19-15: wbc 4.4; hgb 13.7; hct 41.2; mcv 95.8; plt 202; glucose 81; bun 9; creat 0.65; k+ 3.5; na++140; t bili 1.5; albumin 3.8; chol 113; ldl 49; trig 56; hdl 53; tsh 8.640 free t4: 1.0  04-16-15: urine culture: e-coli: amoxicillin 05-08-15: chol 103; ldl 2; trig 59; hdl 39; tsh 0.037; free t4: 2.26 05-17-15: tsh <0.008  06-12-15: liver normal albumin 3.4; chol 131; ldl 66; trig 72; hdl 51 06-21-15: tsh 0.464; free T4: 1.22      Review of Systems Unable to perform ROS: Dementia    Physical Exam Vitals reviewed. Constitutional: No distress.  Overweight   Eyes: Conjunctivae are normal.  Neck: Neck supple. No JVD present. No thyromegaly present.  Cardiovascular: Normal rate, regular rhythm and intact distal pulses.   Respiratory: Effort normal and breath sounds normal. No respiratory distress. She has no wheezes.  GI: Soft. Bowel sounds are normal. She exhibits no distension. There is no tenderness.  Musculoskeletal: She exhibits no edema.  Able to move all extremities  Has limited range of motion in neck   Lymphadenopathy:    She has no cervical adenopathy.  Neurological: She is alert.  Skin: Skin is warm and dry. She is not diaphoretic.  Psychiatric: She has a normal mood and affect.       ASSESSMENT/ PLAN:  1. Hypothyroidism: will continue  synthroid 100 mcg daily her tsh is 0.464 with free T4 1.22    2. Dyslipidemia:  Her ldl is 66;  will continue  lipitor 20 mg daily.   3. 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 Frontotemporal dementia: no change in status will continue aricept 10 mg daily her current weight is 191 pounds  5. Hypertension: will continue hctz 12.5 mg daily   6. GERD: will continue prilosec 20 mg daily   7. Constipation: will continue  senna daily and miralax daily   8. Depression: will continue celexa 10 mg daily; will continue ativan 0.5 mg three times daily as needed for anxiety   9. 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 Allergic rhinitis: will continue flonase daily   11. Viral hepatitis C without coma: no change in status; will monitor  12. Weight loss: this is questionable 30 pound weight loss; will check cbc; cmp; tsh; lipids; vit B12 and folate will monitor her weight.                Synthia Innocenteborah Macarius Ruark NP [facility]  Contact [phone] Monday through Friday 8am- 5pm  After hours call [phone]

## 2015-09-13 LAB — LIPID PANEL
CHOLESTEROL: 119 mg/dL (ref 0–200)
HDL: 52 mg/dL (ref 35–70)
LDL CALC: 50 mg/dL
Triglycerides: 85 mg/dL (ref 40–160)

## 2015-09-13 LAB — HEPATIC FUNCTION PANEL
ALK PHOS: 57 U/L (ref 25–125)
ALT: 10 U/L (ref 7–35)
AST: 18 U/L (ref 13–35)
Bilirubin, Total: 1.1 mg/dL

## 2015-09-13 LAB — BASIC METABOLIC PANEL
BUN: 17 mg/dL (ref 4–21)
Creatinine: 0.6 mg/dL (ref 0.5–1.1)
GLUCOSE: 70 mg/dL
POTASSIUM: 3.5 mmol/L (ref 3.4–5.3)
Sodium: 139 mmol/L (ref 137–147)

## 2015-09-13 LAB — CBC AND DIFFERENTIAL
HEMATOCRIT: 41 % (ref 36–46)
HEMOGLOBIN: 13.4 g/dL (ref 12.0–16.0)
PLATELETS: 210 10*3/uL (ref 150–399)
WBC: 4.2 10*3/mL

## 2015-09-13 LAB — TSH: TSH: 9.74 u[IU]/mL — AB (ref 0.41–5.90)

## 2015-09-15 ENCOUNTER — Non-Acute Institutional Stay (SKILLED_NURSING_FACILITY): Payer: Medicare Other | Admitting: Adult Health

## 2015-09-15 ENCOUNTER — Encounter: Payer: Self-pay | Admitting: Adult Health

## 2015-09-15 DIAGNOSIS — E038 Other specified hypothyroidism: Secondary | ICD-10-CM

## 2015-09-15 DIAGNOSIS — E034 Atrophy of thyroid (acquired): Secondary | ICD-10-CM | POA: Diagnosis not present

## 2015-09-15 LAB — VITAMIN B12
Folate: 7.1
VITAMIN B 12: 764

## 2015-09-15 NOTE — Progress Notes (Signed)
Patient ID: Mallory Burke, female   DOB: [birth year], 82 y.o.   MRN: [id number]   Fac[facility]       No Known Allergies  Chief Complaint  Patient presents with  . Acute Visit    follow up labs      HPI:  Her tsh is elevated and require her synthroid dose to be adjusted. She is unable to fully participate in the hpi or ros. There are no nursing concerns at this time.   Past Medical History  Diagnosis Date  . Chicken pox   . Hyperlipidemia   . Depression   . Hypothyroid   . Bronchitis     hx of  . Anemia   . Blood transfusion     hx of  . Hepatitis     Hx of  . Urinary frequency   . Urinary tract infection     hx of  . Arthritis     "legs, neck"  . Ulcer, stomach peptic     hx of  . Heart murmur     patient's primary Dr. Cranston Neighbor    Past Surgical History  Procedure Laterality Date  . Back surgery      x3, fusion and rod  . Abdominal hysterectomy    . Knee arthroscopy  2007    right  . Abdominal surgery    . Tubal ligation    . Dilation and curettage of uterus    . Breast lumpectomy      right side  . Posterior cervical fusion/foraminotomy  11/13/2011    Procedure: POSTERIOR CERVICAL FUSION/FORAMINOTOMY LEVEL 1;  Surgeon: Carmela Hurt, MD;  Location: [facility];  Service: Neurosurgery;  Laterality: N/A;  Cervical one Cervical two posterior transpedicular fusion with lateral mass screws    VITAL SIGNS BP 118/77 mmHg  Pulse 55  Temp(Src) 96.4 F (35.8 C) (Oral)  Resp 18  Ht  (1.6 m)  Wt 158 lb 4 oz (71.782 kg)  BMI 28.04 kg/m2  Patient's Medications  New Prescriptions   No medications on file  Previous Medications   ATORVASTATIN (LIPITOR) 20 MG TABLET    Take 20 mg by mouth daily at 6 PM.   BENZONATATE (TESSALON) 200 MG CAPSULE    Take 200 mg by mouth every 6 (six) hours as needed for cough.   CITALOPRAM (CELEXA) 10 MG TABLET    Take 10 mg by mouth every morning.    CYANOCOBALAMIN (B-12) 1000 MCG SUBL    Place 1,000 mcg under the  tongue daily.   DONEPEZIL (ARICEPT) 10 MG TABLET    Take 10 mg by mouth at bedtime.    FLUTICASONE (FLONASE) 50 MCG/ACT NASAL SPRAY    Place 2 sprays into both nostrils daily.   GABAPENTIN (NEURONTIN) 300 MG CAPSULE    Take 300 mg by mouth 3 (three) times daily.    HYDROCHLOROTHIAZIDE (,MICROZIDE/HYDRODIURIL,) 12.5 MG CAPSULE    Take 12.5 mg by mouth every morning.    HYDROCODONE-ACETAMINOPHEN (NORCO) 10-325 MG TABLET    Take 1 tablet by mouth at bedtime. AND twice daily as needed   LEVOTHYROXINE (SYNTHROID, LEVOTHROID) 100 MCG TABLET    Take 1 tablet (100 mcg total) by mouth daily.   LOPERAMIDE (IMODIUM) 2 MG CAPSULE    Take 2 mg by mouth 4 (four) times daily as needed for diarrhea or loose stools.   LORAZEPAM (ATIVAN) 0.5 MG TABLET    Take 1 tablet (0.5 mg total) by mouth 3 (three) times  daily.   OMEPRAZOLE (PRILOSEC) 20 MG CAPSULE    Take 20 mg by mouth daily.   PHENOL (CHLORASEPTIC) 1.4 % LIQD    Use as directed 2 sprays in the mouth or throat as needed (for sore throat).   POLYETHYLENE GLYCOL (MIRALAX / GLYCOLAX) PACKET    Take 17 g by mouth daily.   POTASSIUM CHLORIDE SA (K-DUR,KLOR-CON) 20 MEQ TABLET    Take 20 mEq by mouth daily with breakfast. Do not  Crush. Take with food.   SENNA (SENOKOT) 8.6 MG TABS TABLET    Take 1 tablet by mouth at bedtime.   SKIN PROTECTANTS, MISC. (CALAZIME SKIN PROTECTANT) PSTE    Apply topically. Apply to sacrum topically every shift for routine barrier cream.   TIZANIDINE (ZANAFLEX) 2 MG TABLET    Take 1 tablet (2 mg total) by mouth 3 (three) times daily.  Modified Medications   No medications on file  Discontinued Medications   No medications on file     SIGNIFICANT DIAGNOSTIC EXAMS  03-07-15: chest x-ray: No active cardiopulmonary disease.  03-07-15: ct of cervical spine: No evidence for acute cervical spine fracture.   LABS REVIEWED:   03-07-15: wbc 4.4; hgb 14.9; hct 45.8; mcv 99.1; plt 211; glucose 93; bun 7; creat 0.68; k+4.1; na++141; liver  normal albumin 4.4; urine culture: klebsiella pneumoniae  03-19-15: wbc 4.4; hgb 13.7; hct 41.2; mcv 95.8; plt 202; glucose 81; bun 9; creat 0.65; k+ 3.5; na++140; t bili 1.5; albumin 3.8; chol 113; ldl 49; trig 56; hdl 53; tsh 8.640 free t4: 1.0  04-16-15: urine culture: e-coli: amoxicillin 05-08-15: chol 103; ldl 2; trig 59; hdl 39; tsh 0.037; free t4: 2.26 05-17-15: tsh <0.008  06-12-15: liver normal albumin 3.4; chol 131; ldl 66; trig 72; hdl 51 06-21-15: tsh 0.464; free T4: 1.22  09-13-15: wbc 4.2; hgb 13.4;hct 40.6;  mcv 97.6 plt 97.6 plt 210; glucose 70; bun 17; creat 0.56; k+ 3.5; na++139; liver normal albumin 3.2; chol 119; ldl 50; trig 85; hdl 52; tsh 9.74; vit B12: 764; folate 7.1      Review of Systems Unable to perform ROS: Dementia    Physical Exam Vitals reviewed. Constitutional: No distress.  Overweight   Eyes: Conjunctivae are normal.  Neck: Neck supple. No JVD present. No thyromegaly present.  Cardiovascular: Normal rate, regular rhythm and intact distal pulses.   Respiratory: Effort normal and breath sounds normal. No respiratory distress. She has no wheezes.  GI: Soft. Bowel sounds are normal. She exhibits no distension. There is no tenderness.  Musculoskeletal: She exhibits no edema.  Able to move all extremities  Has limited range of motion in neck   Lymphadenopathy:    She has no cervical adenopathy.  Neurological: She is alert.  Skin: Skin is warm and dry. She is not diaphoretic.  Psychiatric: She has a normal mood and affect.       ASSESSMENT/ PLAN:  1. Hypothyroidism: will increase synthroid to 125 mcg daily tsh is 9.74; will recheck tsh in 4 weeks         Synthia Innocenteborah Kathy Wares NP [facility]  Contact [phone] Monday through Friday 8am- 5pm  After hours call [phone]

## 2015-10-09 LAB — HM DIABETES EYE EXAM

## 2015-10-12 ENCOUNTER — Non-Acute Institutional Stay (SKILLED_NURSING_FACILITY): Payer: Medicare Other | Admitting: Adult Health

## 2015-10-12 DIAGNOSIS — G3109 Other frontotemporal dementia: Secondary | ICD-10-CM | POA: Diagnosis not present

## 2015-10-12 DIAGNOSIS — E034 Atrophy of thyroid (acquired): Secondary | ICD-10-CM

## 2015-10-12 DIAGNOSIS — B182 Chronic viral hepatitis C: Secondary | ICD-10-CM

## 2015-10-12 DIAGNOSIS — F418 Other specified anxiety disorders: Secondary | ICD-10-CM | POA: Diagnosis not present

## 2015-10-12 DIAGNOSIS — M47812 Spondylosis without myelopathy or radiculopathy, cervical region: Secondary | ICD-10-CM

## 2015-10-12 DIAGNOSIS — I10 Essential (primary) hypertension: Secondary | ICD-10-CM

## 2015-10-12 DIAGNOSIS — F0280 Dementia in other diseases classified elsewhere without behavioral disturbance: Secondary | ICD-10-CM | POA: Diagnosis not present

## 2015-10-12 DIAGNOSIS — R634 Abnormal weight loss: Secondary | ICD-10-CM | POA: Diagnosis not present

## 2015-10-12 DIAGNOSIS — E038 Other specified hypothyroidism: Secondary | ICD-10-CM | POA: Diagnosis not present

## 2015-10-12 DIAGNOSIS — J302 Other seasonal allergic rhinitis: Secondary | ICD-10-CM

## 2015-10-12 DIAGNOSIS — E785 Hyperlipidemia, unspecified: Secondary | ICD-10-CM | POA: Diagnosis not present

## 2015-10-13 ENCOUNTER — Encounter: Payer: Self-pay | Admitting: Adult Health

## 2015-10-13 LAB — TSH
TSH: 2.82 u[IU]/mL (ref 0.41–5.90)
TSH: 2.82 u[IU]/mL (ref <=5.90)
TSH: 2.82 u[IU]/mL (ref <=5.90)

## 2015-10-13 NOTE — Progress Notes (Signed)
Patient ID: Mallory Burke, female   DOB: Aug 05, 1932, 82 y.o.   MRN: [id number]   Fac[facility]       No Known Allergies  Chief Complaint  Patient presents with  . Medical Management of Chronic Issues    Follow up    HPI:  She is a long term resident of this facility  being seen for the management of her chronic illnesses. Overall her status is stable. Her weight is 153 pounds; which is 2 pound weight from last month.  She is unable to participate in the hpi or ros. There are no nursing concerns at this time.   Past Medical History  Diagnosis Date  . Chicken pox   . Hyperlipidemia   . Depression   . Hypothyroid   . Bronchitis     hx of  . Anemia   . Blood transfusion     hx of  . Hepatitis     Hx of  . Urinary frequency   . Urinary tract infection     hx of  . Arthritis     "legs, neck"  . Ulcer, stomach peptic     hx of  . Heart murmur     patient's primary Dr. Cranston Neighbor    Past Surgical History  Procedure Laterality Date  . Back surgery      x3, fusion and rod  . Abdominal hysterectomy    . Knee arthroscopy  2007    right  . Abdominal surgery    . Tubal ligation    . Dilation and curettage of uterus    . Breast lumpectomy      right side  . Posterior cervical fusion/foraminotomy  11/13/2011    Procedure: POSTERIOR CERVICAL FUSION/FORAMINOTOMY LEVEL 1;  Surgeon: Carmela Hurt, MD;  Location: [facility];  Service: Neurosurgery;  Laterality: N/A;  Cervical one Cervical two posterior transpedicular fusion with lateral mass screws    VITAL SIGNS BP 122/67 mmHg  Pulse 77  Ht  (1.6 m)  Wt 153 lb (69.4 kg)  BMI 27.11 kg/m2  SpO2 97%  Patient's Medications  New Prescriptions   No medications on file  Previous Medications   ASCORBIC ACID (VITAMIN C) 500 MG TABLET    Take 500 mg by mouth 2 (two) times daily.   ATORVASTATIN (LIPITOR) 20 MG TABLET    Take 20 mg by mouth daily at 6 PM.   BENZONATATE (TESSALON) 200 MG CAPSULE    Take 200 mg by  mouth every 6 (six) hours as needed for cough.   CITALOPRAM (CELEXA) 10 MG TABLET    Take 10 mg by mouth every morning.    CYANOCOBALAMIN (B-12) 1000 MCG SUBL    Place 1,000 mcg under the tongue daily.   DONEPEZIL (ARICEPT) 10 MG TABLET    Take 10 mg by mouth at bedtime.    FLUTICASONE (FLONASE) 50 MCG/ACT NASAL SPRAY    Place 2 sprays into both nostrils daily.   GABAPENTIN (NEURONTIN) 300 MG CAPSULE    Take 300 mg by mouth 3 (three) times daily.    HYDROCHLOROTHIAZIDE (,MICROZIDE/HYDRODIURIL,) 12.5 MG CAPSULE    Take 12.5 mg by mouth every morning.    HYDROCODONE-ACETAMINOPHEN (NORCO) 10-325 MG TABLET    Take 1 tablet by mouth at bedtime. AND twice daily as needed   LEVOTHYROXINE (SYNTHROID, LEVOTHROID) 125 MCG TABLET    Take 1 tablet (125 mcg total) by mouth daily.   LOPERAMIDE (IMODIUM) 2 MG CAPSULE  Take 2 mg by mouth 4 (four) times daily as needed for diarrhea or loose stools.   LORAZEPAM (ATIVAN) 0.5 MG TABLET    Take 1 tablet (0.5 mg total) by mouth 3 (three) times daily.   OMEPRAZOLE (PRILOSEC) 20 MG CAPSULE    Take 20 mg by mouth daily.   PHENOL (CHLORASEPTIC) 1.4 % LIQD    Use as directed 2 sprays in the mouth or throat as needed (for sore throat).   POLYETHYLENE GLYCOL (MIRALAX / GLYCOLAX) PACKET    Take 17 g by mouth daily.   POTASSIUM CHLORIDE SA (K-DUR,KLOR-CON) 20 MEQ TABLET    Take 20 mEq by mouth daily with breakfast. Do not  Crush. Take with food.   SENNA (SENOKOT) 8.6 MG TABS TABLET    Take 1 tablet by mouth at bedtime.   SKIN PROTECTANTS, MISC. (CALAZIME SKIN PROTECTANT) PSTE    Apply topically. Apply to sacrum topically every shift for routine barrier cream.   TIZANIDINE (ZANAFLEX) 2 MG TABLET    Take 1 tablet (2 mg total) by mouth 3 (three) times daily.   ZINC SULFATE 220 MG CAPSULE    Take 220 mg by mouth daily.  Modified Medications   No medications on file  Discontinued Medications   No medications on file     SIGNIFICANT DIAGNOSTIC EXAMS  03-07-15: chest x-ray:  No active cardiopulmonary disease.  03-07-15: ct of cervical spine: No evidence for acute cervical spine fracture.   LABS REVIEWED:   03-07-15: wbc 4.4; hgb 14.9; hct 45.8; mcv 99.1; plt 211; glucose 93; bun 7; creat 0.68; k+4.1; na++141; liver normal albumin 4.4; urine culture: klebsiella pneumoniae  03-19-15: wbc 4.4; hgb 13.7; hct 41.2; mcv 95.8; plt 202; glucose 81; bun 9; creat 0.65; k+ 3.5; na++140; t bili 1.5; albumin 3.8; chol 113; ldl 49; trig 56; hdl 53; tsh 8.640 free t4: 1.0  04-16-15: urine culture: e-coli: amoxicillin 05-08-15: chol 103; ldl 2; trig 59; hdl 39; tsh 0.037; free t4: 2.26 05-17-15: tsh <0.008  06-12-15: liver normal albumin 3.4; chol 131; ldl 66; trig 72; hdl 51 06-21-15: tsh 0.464; free T4: 1.22  09-13-15: wbc 4.2; hgb 13.4;hct 40.6;  mcv 97.6 plt 97.6 plt 210; glucose 70; bun 17; creat 0.56; k+ 3.5; na++139; liver normal albumin 3.2; chol 119; ldl 50; trig 85; hdl 52; tsh 9.74; vit B12: 764; folate 7.1      Review of Systems Unable to perform ROS: Dementia    Physical Exam Vitals reviewed. Constitutional: No distress.  Overweight   Eyes: Conjunctivae are normal.  Neck: Neck supple. No JVD present. No thyromegaly present.  Cardiovascular: Normal rate, regular rhythm and intact distal pulses.   Respiratory: Effort normal and breath sounds normal. No respiratory distress. She has no wheezes.  GI: Soft. Bowel sounds are normal. She exhibits no distension. There is no tenderness.  Musculoskeletal: She exhibits no edema.  Able to move all extremities  Has limited range of motion in neck   Lymphadenopathy:    She has no cervical adenopathy.  Neurological: She is alert.  Skin: Skin is warm and dry. She is not diaphoretic.  Psychiatric: She has a normal mood and affect.       ASSESSMENT/ PLAN:  1. Hypothyroidism: will continue  synthroid 125 mcg daily her tsh is 9.74; her thyroid labs are pending.   2. Dyslipidemia:  Her ldl is 50; will continue  lipitor  20 mg daily.   3. 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 Frontotemporal dementia: no change in status will continue aricept 10 mg daily her current weight is 153 pounds  5. Hypertension: will continue hctz 12.5 mg daily   6. GERD: will continue prilosec 20 mg daily   7. Constipation: will continue  senna daily and miralax daily   8. Depression: will continue celexa 10 mg daily; will continue ativan 0.5 mg three times daily as needed for anxiety   9. Allergic rhinitis: will continue flonase daily   10. Viral hepatitis C without coma: no change in status; will monitor  11. Weight loss: her weight this month is 153 pounds last month was 155 pounds; will not make changes at this time; will continue to monitor her status and will make changes in the future as indicated.            Synthia Innocent NP [facility]  Contact [phone] Monday through Friday 8am- 5pm  After hours call [phone]

## 2015-11-09 ENCOUNTER — Non-Acute Institutional Stay (SKILLED_NURSING_FACILITY): Payer: Medicare Other | Admitting: Adult Health

## 2015-11-09 ENCOUNTER — Encounter: Payer: Self-pay | Admitting: Adult Health

## 2015-11-09 DIAGNOSIS — E038 Other specified hypothyroidism: Secondary | ICD-10-CM

## 2015-11-09 DIAGNOSIS — E034 Atrophy of thyroid (acquired): Secondary | ICD-10-CM | POA: Diagnosis not present

## 2015-11-09 DIAGNOSIS — B182 Chronic viral hepatitis C: Secondary | ICD-10-CM | POA: Diagnosis not present

## 2015-11-09 DIAGNOSIS — J302 Other seasonal allergic rhinitis: Secondary | ICD-10-CM | POA: Diagnosis not present

## 2015-11-09 DIAGNOSIS — I10 Essential (primary) hypertension: Secondary | ICD-10-CM

## 2015-11-09 DIAGNOSIS — G3109 Other frontotemporal dementia: Secondary | ICD-10-CM | POA: Diagnosis not present

## 2015-11-09 DIAGNOSIS — F418 Other specified anxiety disorders: Secondary | ICD-10-CM | POA: Diagnosis not present

## 2015-11-09 DIAGNOSIS — M47812 Spondylosis without myelopathy or radiculopathy, cervical region: Secondary | ICD-10-CM

## 2015-11-09 DIAGNOSIS — F0280 Dementia in other diseases classified elsewhere without behavioral disturbance: Secondary | ICD-10-CM | POA: Diagnosis not present

## 2015-11-09 NOTE — Progress Notes (Signed)
Patient ID: Mallory Burke, female   DOB: 04-19-1933, 82 y.o.   MRN: [id number]    [facility]     CODE STATUS: Full Code  No Known Allergies  Chief Complaint  Patient presents with  . Medical Management of Chronic Issues    Follow up    HPI:  She is a long term resident of this facility being seen for the management of her chronic illnesses. Overall there is no significant change in her status. Her weight is stable at 158 pounds. She is unable to fully participate in the hpi or ros. There are no nursing concerns at this time.    Past Medical History  Diagnosis Date  . Chicken pox   . Hyperlipidemia   . Depression   . Hypothyroid   . Bronchitis     hx of  . Anemia   . Blood transfusion     hx of  . Hepatitis     Hx of  . Urinary frequency   . Urinary tract infection     hx of  . Arthritis     "legs, neck"  . Ulcer, stomach peptic     hx of  . Heart murmur     patient's primary Dr. Cranston Neighbor    Past Surgical History  Procedure Laterality Date  . Back surgery      x3, fusion and rod  . Abdominal hysterectomy    . Knee arthroscopy  2007    right  . Abdominal surgery    . Tubal ligation    . Dilation and curettage of uterus    . Breast lumpectomy      right side  . Posterior cervical fusion/foraminotomy  11/13/2011    Procedure: POSTERIOR CERVICAL FUSION/FORAMINOTOMY LEVEL 1;  Surgeon: Carmela Hurt, MD;  Location: [facility];  Service: Neurosurgery;  Laterality: N/A;  Cervical one Cervical two posterior transpedicular fusion with lateral mass screws    Social History   Social History  . Marital Status: Divorced    Spouse Name: N/A  . Number of Children: N/A  . Years of Education: N/A   Occupational History  . Not on file.   Social History Main Topics  . Smoking status: Never Smoker   . Smokeless tobacco: Not on file  . Alcohol Use: No  . Drug Use: No  . Sexual Activity: Not on file   Other Topics Concern  . Not on file   Social  History Narrative   Family History  Problem Relation Age of Onset  . Arthritis    . Hyperlipidemia    . Hypertension        VITAL SIGNS BP 113/61 mmHg  Pulse 69  Temp(Src) 97.7 F (36.5 C) (Oral)  Resp 18  Ht  (1.6 m)  Wt 158 lb 2 oz (71.725 kg)  BMI 28.02 kg/m2  SpO2 97%  Patient's Medications  New Prescriptions   No medications on file  Previous Medications   ASCORBIC ACID (VITAMIN C) 500 MG TABLET    Take 500 mg by mouth 2 (two) times daily.   ATORVASTATIN (LIPITOR) 20 MG TABLET    Take 20 mg by mouth daily at 6 PM.   BENZONATATE (TESSALON) 200 MG CAPSULE    Take 200 mg by mouth every 6 (six) hours as needed for cough.   CITALOPRAM (CELEXA) 10 MG TABLET    Take 10 mg by mouth every morning.    CYANOCOBALAMIN (B-12) 1000 MCG SUBL  Place 1,000 mcg under the tongue daily.   DONEPEZIL (ARICEPT) 10 MG TABLET    Take 10 mg by mouth at bedtime.    FLUTICASONE (FLONASE) 50 MCG/ACT NASAL SPRAY    Place 2 sprays into both nostrils daily.   GABAPENTIN (NEURONTIN) 300 MG CAPSULE    Take 300 mg by mouth 3 (three) times daily.    HYDROCHLOROTHIAZIDE (,MICROZIDE/HYDRODIURIL,) 12.5 MG CAPSULE    Take 12.5 mg by mouth every morning.    HYDROCODONE-ACETAMINOPHEN (NORCO) 10-325 MG TABLET    Take 1 tablet by mouth at bedtime. AND twice daily as needed   LEVOTHYROXINE (SYNTHROID, LEVOTHROID) 125 MCG TABLET    Take 125 mcg by mouth daily before breakfast.   LOPERAMIDE (IMODIUM) 2 MG CAPSULE    Take 2 mg by mouth 4 (four) times daily as needed for diarrhea or loose stools.   LORAZEPAM (ATIVAN) 0.5 MG TABLET    Take 1 tablet (0.5 mg total) by mouth 3 (three) times daily.   OMEPRAZOLE (PRILOSEC) 20 MG CAPSULE    Take 20 mg by mouth daily.   PHENOL (CHLORASEPTIC) 1.4 % LIQD    Use as directed 2 sprays in the mouth or throat as needed (for sore throat).   POLYETHYLENE GLYCOL (MIRALAX / GLYCOLAX) PACKET    Take 17 g by mouth daily.   POTASSIUM CHLORIDE SA (K-DUR,KLOR-CON) 20 MEQ TABLET     Take 20 mEq by mouth daily with breakfast. Do not  Crush. Take with food.   SENNA (SENOKOT) 8.6 MG TABS TABLET    Take 1 tablet by mouth at bedtime.   SKIN PROTECTANTS, MISC. (CALAZIME SKIN PROTECTANT) PSTE    Apply topically. Apply to sacrum topically every shift for routine barrier cream.   TIZANIDINE (ZANAFLEX) 2 MG TABLET    Take 1 tablet (2 mg total) by mouth 3 (three) times daily.  Modified Medications   No medications on file  Discontinued Medications   LEVOTHYROXINE (SYNTHROID, LEVOTHROID) 100 MCG TABLET    Take 1 tablet (100 mcg total) by mouth daily.   ZINC SULFATE 220 MG CAPSULE    Take 220 mg by mouth daily. Reported on 11/09/2015     SIGNIFICANT DIAGNOSTIC EXAMS  03-07-15: chest x-ray: No active cardiopulmonary disease.  03-07-15: ct of cervical spine: No evidence for acute cervical spine fracture.   LABS REVIEWED:   03-07-15: wbc 4.4; hgb 14.9; hct 45.8; mcv 99.1; plt 211; glucose 93; bun 7; creat 0.68; k+4.1; na++141; liver normal albumin 4.4; urine culture: klebsiella pneumoniae  03-19-15: wbc 4.4; hgb 13.7; hct 41.2; mcv 95.8; plt 202; glucose 81; bun 9; creat 0.65; k+ 3.5; na++140; t bili 1.5; albumin 3.8; chol 113; ldl 49; trig 56; hdl 53; tsh 8.640 free t4: 1.0  04-16-15: urine culture: e-coli: amoxicillin 05-08-15: chol 103; ldl 2; trig 59; hdl 39; tsh 0.037; free t4: 2.26 05-17-15: tsh <0.008  06-12-15: liver normal albumin 3.4; chol 131; ldl 66; trig 72; hdl 51 06-21-15: tsh 0.464; free T4: 1.22  09-13-15: wbc 4.2; hgb 13.4;hct 40.6;  mcv 97.6 plt 97.6 plt 210; glucose 70; bun 17; creat 0.56; k+ 3.5; na++139; liver normal albumin 3.2; chol 119; ldl 50; trig 85; hdl 52; tsh 9.74; vit B12: 764; folate 7.1  10-13-15: tsh 2.82      Review of Systems Unable to perform ROS: Dementia    Physical Exam Vitals reviewed. Constitutional: No distress.  Overweight   Eyes: Conjunctivae are normal.  Neck: Neck supple. No JVD present. No thyromegaly  present.  Cardiovascular:  Normal rate, regular rhythm and intact distal pulses.   Respiratory: Effort normal and breath sounds normal. No respiratory distress. She has no wheezes.  GI: Soft. Bowel sounds are normal. She exhibits no distension. There is no tenderness.  Musculoskeletal: She exhibits no edema.  Able to move all extremities  Has limited range of motion in neck   Lymphadenopathy:    She has no cervical adenopathy.  Neurological: She is alert.  Skin: Skin is warm and dry. She is not diaphoretic.  Psychiatric: She has a normal mood and affect.       ASSESSMENT/ PLAN:  1. Hypothyroidism: will continue  synthroid 125 mcg daily her tsh is 2.82  2. Dyslipidemia:  Her ldl is 50; will continue  lipitor 20 mg daily.   3. 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 Frontotemporal dementia: no change in status will continue aricept 10 mg daily her current weight is 158 pounds  5. Hypertension: will continue hctz 12.5 mg daily   6. GERD: will continue prilosec 20 mg daily   7. Constipation: will continue  senna daily and miralax daily   8. Depression: will continue celexa 10 mg daily; will continue ativan 0.5 mg three times daily as needed for anxiety   9. Allergic rhinitis: will continue flonase daily   10. Viral hepatitis C without coma: no change in status; will monitor  11. Weight loss: her weight this month is 158  pounds; will not make changes at this time; will continue to monitor her status and will make changes in the future as indicated.        Synthia Innocenteborah Anjani Feuerborn NP [facility]  Contact [phone] Monday through Friday 8am- 5pm  After hours call [phone]

## 2015-12-11 ENCOUNTER — Non-Acute Institutional Stay (SKILLED_NURSING_FACILITY): Payer: Medicare Other | Admitting: Adult Health

## 2015-12-11 ENCOUNTER — Encounter: Payer: Self-pay | Admitting: Adult Health

## 2015-12-11 DIAGNOSIS — I10 Essential (primary) hypertension: Secondary | ICD-10-CM | POA: Diagnosis not present

## 2015-12-11 DIAGNOSIS — E785 Hyperlipidemia, unspecified: Secondary | ICD-10-CM

## 2015-12-11 DIAGNOSIS — F418 Other specified anxiety disorders: Secondary | ICD-10-CM | POA: Diagnosis not present

## 2015-12-11 DIAGNOSIS — E034 Atrophy of thyroid (acquired): Secondary | ICD-10-CM | POA: Diagnosis not present

## 2015-12-11 DIAGNOSIS — B182 Chronic viral hepatitis C: Secondary | ICD-10-CM | POA: Diagnosis not present

## 2015-12-11 DIAGNOSIS — M47812 Spondylosis without myelopathy or radiculopathy, cervical region: Secondary | ICD-10-CM | POA: Diagnosis not present

## 2015-12-11 DIAGNOSIS — J302 Other seasonal allergic rhinitis: Secondary | ICD-10-CM

## 2015-12-11 DIAGNOSIS — K5901 Slow transit constipation: Secondary | ICD-10-CM | POA: Diagnosis not present

## 2015-12-11 DIAGNOSIS — E038 Other specified hypothyroidism: Secondary | ICD-10-CM | POA: Diagnosis not present

## 2015-12-11 NOTE — Progress Notes (Signed)
Patient ID: Mallory Burke, female   DOB: 1933/02/22, 82 y.o.   MRN: [id number]   [facility] Room Number: 153-B Place of Service:  SNF (31)   CODE STATUS: Full Code  No Known Allergies  Chief Complaint  Patient presents with  . Medical Management of Chronic Issues    Follow up    HPI:  She is a long term resident of this facility being seen for the management of her chronic illnesses. Overall there is little change in her status. She does spend nearly all of her time in bed per her choice. She is unable to fully participate in the hpi or ros. There are no nursing concerns at this time.    Past Medical History  Diagnosis Date  . Chicken pox   . Hyperlipidemia   . Depression   . Hypothyroid   . Bronchitis     hx of  . Anemia   . Blood transfusion     hx of  . Hepatitis     Hx of  . Urinary frequency   . Urinary tract infection     hx of  . Arthritis     "legs, neck"  . Ulcer, stomach peptic     hx of  . Heart murmur     patient's primary Dr. Cranston Neighborabari    Past Surgical History  Procedure Laterality Date  . Back surgery      x3, fusion and rod  . Abdominal hysterectomy    . Knee arthroscopy  2007    right  . Abdominal surgery    . Tubal ligation    . Dilation and curettage of uterus    . Breast lumpectomy      right side  . Posterior cervical fusion/foraminotomy  11/13/2011    Procedure: POSTERIOR CERVICAL FUSION/FORAMINOTOMY LEVEL 1;  Surgeon: Carmela HurtKyle L Cabbell, MD;  Location: [facility];  Service: Neurosurgery;  Laterality: N/A;  Cervical one Cervical two posterior transpedicular fusion with lateral mass screws    Social History   Social History  . Marital Status: Divorced    Spouse Name: N/A  . Number of Children: N/A  . Years of Education: N/A   Occupational History  . Not on file.   Social History Main Topics  . Smoking status: Never Smoker   . Smokeless tobacco: Not on file  . Alcohol Use: No  .  Drug Use: No  . Sexual Activity: Not on file   Other Topics Concern  . Not on file   Social History Narrative   Family History  Problem Relation Age of Onset  . Arthritis    . Hyperlipidemia    . Hypertension        VITAL SIGNS BP 124/66 mmHg  Pulse 74  Temp(Src) 97.7 F (36.5 C) (Oral)  Resp 18  Ht 5\' 3"  (1.6 m)  Wt 160 lb 3 oz (72.661 kg)  BMI 28.38 kg/m2  SpO2 98%  Patient's Medications  New Prescriptions   No medications on file  Previous Medications   ASCORBIC ACID (VITAMIN C) 500 MG TABLET    Take 500 mg by mouth 2 (two) times daily.   ATORVASTATIN (LIPITOR) 20 MG TABLET    Take 20 mg by mouth daily at 6 PM.   BENZONATATE (TESSALON) 200 MG CAPSULE    Take 200 mg by mouth every 6 (six) hours as needed for cough.   CITALOPRAM (CELEXA) 10 MG TABLET    Take  10 mg by mouth every morning.    CYANOCOBALAMIN (B-12) 1000 MCG SUBL    Place 1,000 mcg under the tongue daily.   DONEPEZIL (ARICEPT) 10 MG TABLET    Take 10 mg by mouth at bedtime.    FLUTICASONE (FLONASE) 50 MCG/ACT NASAL SPRAY    Place 2 sprays into both nostrils daily.   GABAPENTIN (NEURONTIN) 300 MG CAPSULE    Take 300 mg by mouth 3 (three) times daily.    HYDROCHLOROTHIAZIDE (,MICROZIDE/HYDRODIURIL,) 12.5 MG CAPSULE    Take 12.5 mg by mouth every morning.    HYDROCODONE-ACETAMINOPHEN (NORCO) 10-325 MG TABLET    Take 1 tablet by mouth at bedtime. AND twice daily as needed   LEVOTHYROXINE (SYNTHROID, LEVOTHROID) 125 MCG TABLET    Take 125 mcg by mouth daily before breakfast.   LOPERAMIDE (IMODIUM) 2 MG CAPSULE    Take 2 mg by mouth 4 (four) times daily as needed for diarrhea or loose stools.   LORAZEPAM (ATIVAN) 0.5 MG TABLET    Take 1 tablet (0.5 mg total) by mouth 3 (three) times daily.   OMEPRAZOLE (PRILOSEC) 20 MG CAPSULE    Take 20 mg by mouth daily.   PHENOL (CHLORASEPTIC) 1.4 % LIQD    Use as directed 2 sprays in the mouth or throat as needed (for sore throat).   POLYETHYLENE GLYCOL (MIRALAX / GLYCOLAX)  PACKET    Take 17 g by mouth daily.   POTASSIUM CHLORIDE SA (K-DUR,KLOR-CON) 20 MEQ TABLET    Take 20 mEq by mouth daily with breakfast. Do not  Crush. Take with food.   SENNA (SENOKOT) 8.6 MG TABS TABLET    Take 1 tablet by mouth at bedtime.   SKIN PROTECTANTS, MISC. (CALAZIME SKIN PROTECTANT) PSTE    Apply topically. Apply to sacrum topically every shift for routine barrier cream.   TIZANIDINE (ZANAFLEX) 2 MG TABLET    Take 1 tablet (2 mg total) by mouth 3 (three) times daily.  Modified Medications   No medications on file  Discontinued Medications   No medications on file     SIGNIFICANT DIAGNOSTIC EXAMS   03-07-15: chest x-ray: No active cardiopulmonary disease.  03-07-15: ct of cervical spine: No evidence for acute cervical spine fracture.   LABS REVIEWED:   03-07-15: wbc 4.4; hgb 14.9; hct 45.8; mcv 99.1; plt 211; glucose 93; bun 7; creat 0.68; k+4.1; na++141; liver normal albumin 4.4; urine culture: klebsiella pneumoniae  03-19-15: wbc 4.4; hgb 13.7; hct 41.2; mcv 95.8; plt 202; glucose 81; bun 9; creat 0.65; k+ 3.5; na++140; t bili 1.5; albumin 3.8; chol 113; ldl 49; trig 56; hdl 53; tsh 8.640 free t4: 1.0  04-16-15: urine culture: e-coli: amoxicillin 05-08-15: chol 103; ldl 2; trig 59; hdl 39; tsh 0.037; free t4: 2.26 05-17-15: tsh <0.008  06-12-15: liver normal albumin 3.4; chol 131; ldl 66; trig 72; hdl 51 06-21-15: tsh 0.464; free T4: 1.22  09-13-15: wbc 4.2; hgb 13.4;hct 40.6;  mcv 97.6 plt 97.6 plt 210; glucose 70; bun 17; creat 0.56; k+ 3.5; na++139; liver normal albumin 3.2; chol 119; ldl 50; trig 85; hdl 52; tsh 9.74; vit B12: 764; folate 7.1  10-13-15: tsh 2.82      Review of Systems Unable to perform ROS: Dementia    Physical Exam Vitals reviewed. Constitutional: No distress.  Overweight   Eyes: Conjunctivae are normal.  Neck: Neck supple. No JVD present. No thyromegaly present.  Cardiovascular: Normal rate, regular rhythm and intact distal pulses.   Respiratory:  Effort normal and breath sounds normal. No respiratory distress. She has no wheezes.  GI: Soft. Bowel sounds are normal. She exhibits no distension. There is no tenderness.  Musculoskeletal: She exhibits no edema.  Able to move all extremities  Has limited range of motion in neck   Lymphadenopathy:    She has no cervical adenopathy.  Neurological: She is alert.  Skin: Skin is warm and dry. She is not diaphoretic.  Psychiatric: She has a normal mood and affect.       ASSESSMENT/ PLAN:  1. Hypothyroidism: will continue  synthroid 125 mcg daily her tsh is 2.82  2. Dyslipidemia:  Her ldl is 50; will continue  lipitor 20 mg daily.   3. 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 Frontotemporal dementia: no change in status will continue aricept 10 mg daily her current weight is 160 pounds  5. Hypertension: will continue hctz 12.5 mg daily   6. GERD: will continue prilosec 20 mg daily   7. Constipation: will continue  senna daily and miralax daily   8. Depression: will continue celexa 10 mg daily; will continue ativan 0.5 mg three times daily  for anxiety   9. Allergic rhinitis: will continue flonase daily   10. Viral hepatitis C without coma: no change in status; will monitor  11. Weight loss: her weight this month is 160  pounds; will not make changes at this time; will continue to monitor her status and will make changes in the future as indicated.   12. Hypokalemia: will continue k+ 20 meq daily    Will check tsh free T3 free T4    Synthia Innocent NP [facility]  Contact [phone] Monday through Friday 8am- 5pm  After hours call [phone]

## 2015-12-28 ENCOUNTER — Encounter: Payer: Self-pay | Admitting: Adult Health

## 2015-12-28 ENCOUNTER — Non-Acute Institutional Stay (SKILLED_NURSING_FACILITY): Payer: Medicare Other | Admitting: Adult Health

## 2015-12-28 DIAGNOSIS — L89152 Pressure ulcer of sacral region, stage 2: Secondary | ICD-10-CM

## 2015-12-28 NOTE — Progress Notes (Signed)
Patient ID: Mallory Burke, female   DOB: Mar 05, 1933, 82 y.o.   MRN: [id number]   Location:   [facility] Room Number: 153-B Place of Service:  SNF (31)   CODE STATUS: Full Code  No Known Allergies  Chief Complaint  Patient presents with  . Acute Visit    Wound Check    HPI:  I have been asked to review her sacral wound. The area is nearly resolved. The dressing can be removed; and can begin treated with zinc oxide. There are no signs of infection present.   Past Medical History  Diagnosis Date  . Chicken pox   . Hyperlipidemia   . Depression   . Hypothyroid   . Bronchitis     hx of  . Anemia   . Blood transfusion     hx of  . Hepatitis     Hx of  . Urinary frequency   . Urinary tract infection     hx of  . Arthritis     "legs, neck"  . Ulcer, stomach peptic     hx of  . Heart murmur     patient's primary Dr. Cranston Neighborabari    Past Surgical History  Procedure Laterality Date  . Back surgery      x3, fusion and rod  . Abdominal hysterectomy    . Knee arthroscopy  2007    right  . Abdominal surgery    . Tubal ligation    . Dilation and curettage of uterus    . Breast lumpectomy      right side  . Posterior cervical fusion/foraminotomy  11/13/2011    Procedure: POSTERIOR CERVICAL FUSION/FORAMINOTOMY LEVEL 1;  Surgeon: Carmela HurtKyle L Cabbell, MD;  Location: [facility];  Service: Neurosurgery;  Laterality: N/A;  Cervical one Cervical two posterior transpedicular fusion with lateral mass screws    Social History   Social History  . Marital Status: Divorced    Spouse Name: N/A  . Number of Children: N/A  . Years of Education: N/A   Occupational History  . Not on file.   Social History Main Topics  . Smoking status: Never Smoker   . Smokeless tobacco: Not on file  . Alcohol Use: No  . Drug Use: No  . Sexual Activity: Not on file   Other Topics Concern  . Not on file   Social History Narrative   Family History  Problem Relation Age of Onset    . Arthritis    . Hyperlipidemia    . Hypertension        VITAL SIGNS BP 120/64 mmHg  Pulse 76  Temp(Src) 97.5 F (36.4 C) (Oral)  Resp 18  Ht 5\' 3"  (1.6 m)  Wt 160 lb (72.576 kg)  BMI 28.35 kg/m2  SpO2 94%  Patient's Medications  New Prescriptions   No medications on file  Previous Medications   ASCORBIC ACID (VITAMIN C) 500 MG TABLET    Take 500 mg by mouth 2 (two) times daily.   ATORVASTATIN (LIPITOR) 20 MG TABLET    Take 20 mg by mouth daily at 6 PM.   BENZONATATE (TESSALON) 200 MG CAPSULE    Take 200 mg by mouth every 6 (six) hours as needed for cough.   CITALOPRAM (CELEXA) 10 MG TABLET    Take 10 mg by mouth every morning.    CYANOCOBALAMIN (B-12) 1000 MCG SUBL    Place 1,000 mcg under the tongue daily.   DONEPEZIL (ARICEPT) 10 MG  TABLET    Take 10 mg by mouth at bedtime.    FLUTICASONE (FLONASE) 50 MCG/ACT NASAL SPRAY    Place 2 sprays into both nostrils daily.   GABAPENTIN (NEURONTIN) 300 MG CAPSULE    Take 300 mg by mouth 3 (three) times daily.    HYDROCHLOROTHIAZIDE (,MICROZIDE/HYDRODIURIL,) 12.5 MG CAPSULE    Take 12.5 mg by mouth every morning.    HYDROCODONE-ACETAMINOPHEN (NORCO) 10-325 MG TABLET    Take 1 tablet by mouth at bedtime. AND twice daily as needed   LEVOTHYROXINE (SYNTHROID, LEVOTHROID) 125 MCG TABLET    Take 125 mcg by mouth daily before breakfast.   LOPERAMIDE (IMODIUM) 2 MG CAPSULE    Take 2 mg by mouth 4 (four) times daily as needed for diarrhea or loose stools.   LORAZEPAM (ATIVAN) 0.5 MG TABLET    Take 1 tablet (0.5 mg total) by mouth 3 (three) times daily.   OMEPRAZOLE (PRILOSEC) 20 MG CAPSULE    Take 20 mg by mouth daily.   PHENOL (CHLORASEPTIC) 1.4 % LIQD    Use as directed 2 sprays in the mouth or throat as needed (for sore throat).   POLYETHYLENE GLYCOL (MIRALAX / GLYCOLAX) PACKET    Take 17 g by mouth daily.   POTASSIUM CHLORIDE SA (K-DUR,KLOR-CON) 20 MEQ TABLET    Take 20 mEq by mouth daily with breakfast. Do not  Crush. Take with food.    SENNA (SENOKOT) 8.6 MG TABS TABLET    Take 1 tablet by mouth at bedtime.   SKIN PROTECTANTS, MISC. (CALAZIME SKIN PROTECTANT) PSTE    Apply topically. Apply to sacrum topically every shift for routine barrier cream.   TIZANIDINE (ZANAFLEX) 2 MG TABLET    Take 1 tablet (2 mg total) by mouth 3 (three) times daily.  Modified Medications   No medications on file  Discontinued Medications   No medications on file     SIGNIFICANT DIAGNOSTIC EXAMS  03-07-15: chest x-ray: No active cardiopulmonary disease.  03-07-15: ct of cervical spine: No evidence for acute cervical spine fracture.   LABS REVIEWED:   03-07-15: wbc 4.4; hgb 14.9; hct 45.8; mcv 99.1; plt 211; glucose 93; bun 7; creat 0.68; k+4.1; na++141; liver normal albumin 4.4; urine culture: klebsiella pneumoniae  03-19-15: wbc 4.4; hgb 13.7; hct 41.2; mcv 95.8; plt 202; glucose 81; bun 9; creat 0.65; k+ 3.5; na++140; t bili 1.5; albumin 3.8; chol 113; ldl 49; trig 56; hdl 53; tsh 8.640 free t4: 1.0  04-16-15: urine culture: e-coli: amoxicillin 05-08-15: chol 103; ldl 2; trig 59; hdl 39; tsh 0.037; free t4: 2.26 05-17-15: tsh <0.008  06-12-15: liver normal albumin 3.4; chol 131; ldl 66; trig 72; hdl 51 06-21-15: tsh 0.464; free T4: 1.22  09-13-15: wbc 4.2; hgb 13.4;hct 40.6;  mcv 97.6 plt 97.6 plt 210; glucose 70; bun 17; creat 0.56; k+ 3.5; na++139; liver normal albumin 3.2; chol 119; ldl 50; trig 85; hdl 52; tsh 9.74; vit B12: 764; folate 7.1  10-13-15: tsh 2.82      Review of Systems Unable to perform ROS: Dementia    Physical Exam Vitals reviewed. Constitutional: No distress.  Overweight   Eyes: Conjunctivae are normal.  Neck: Neck supple. No JVD present. No thyromegaly present.  Cardiovascular: Normal rate, regular rhythm and intact distal pulses.   Respiratory: Effort normal and breath sounds normal. No respiratory distress. She has no wheezes.  GI: Soft. Bowel sounds are normal. She exhibits no distension. There is no tenderness.   Musculoskeletal: She  exhibits no edema.  Able to move all extremities  Has limited range of motion in neck   Lymphadenopathy:    She has no cervical adenopathy.  Neurological: She is alert.  Skin: Skin is warm and dry. She is not diaphoretic.  Psychiatric: She has a normal mood and affect. Nearly resolved sacral ulceration       ASSESSMENT/ PLAN:  1. Sacral ulceration: will resolve this problem and will use zinc oxide per facility protocol     Synthia Innocenteborah Tyreonna Czaplicki NP [facility]  Contact [phone] Monday through Friday 8am- 5pm  After hours call [phone]

## 2016-01-15 ENCOUNTER — Encounter: Payer: Self-pay | Admitting: Adult Health

## 2016-01-15 ENCOUNTER — Non-Acute Institutional Stay (SKILLED_NURSING_FACILITY): Payer: Medicare Other | Admitting: Adult Health

## 2016-01-15 DIAGNOSIS — E038 Other specified hypothyroidism: Secondary | ICD-10-CM | POA: Diagnosis not present

## 2016-01-15 DIAGNOSIS — G252 Other specified forms of tremor: Secondary | ICD-10-CM

## 2016-01-15 DIAGNOSIS — K5901 Slow transit constipation: Secondary | ICD-10-CM

## 2016-01-15 DIAGNOSIS — B182 Chronic viral hepatitis C: Secondary | ICD-10-CM | POA: Diagnosis not present

## 2016-01-15 DIAGNOSIS — G3109 Other frontotemporal dementia: Secondary | ICD-10-CM | POA: Diagnosis not present

## 2016-01-15 DIAGNOSIS — F0280 Dementia in other diseases classified elsewhere without behavioral disturbance: Secondary | ICD-10-CM | POA: Diagnosis not present

## 2016-01-15 DIAGNOSIS — I10 Essential (primary) hypertension: Secondary | ICD-10-CM

## 2016-01-15 DIAGNOSIS — E034 Atrophy of thyroid (acquired): Secondary | ICD-10-CM

## 2016-01-15 DIAGNOSIS — M47812 Spondylosis without myelopathy or radiculopathy, cervical region: Secondary | ICD-10-CM | POA: Diagnosis not present

## 2016-01-15 DIAGNOSIS — E785 Hyperlipidemia, unspecified: Secondary | ICD-10-CM | POA: Diagnosis not present

## 2016-01-15 DIAGNOSIS — R634 Abnormal weight loss: Secondary | ICD-10-CM | POA: Diagnosis not present

## 2016-01-15 NOTE — Progress Notes (Signed)
Patient ID: Mallory Burke, female   DOB: 1933/06/02, 82 y.o.   MRN: [id number]   Location:   [facility] Room Number: 153-B Place of Service:  SNF (31)   CODE STATUS: Full Code  No Known Allergies  Chief Complaint  Patient presents with  . Medical Management of Chronic Issues    Follow up    HPI:  She is a long term resident of this facility being seen for the management of her chronic illnesses. She is more conversant today and is complaining of right knee pain; bilateral tremors and bilateral leg spasms. She is also constipated.   Past Medical History  Diagnosis Date  . Chicken pox   . Hyperlipidemia   . Depression   . Hypothyroid   . Bronchitis     hx of  . Anemia   . Blood transfusion     hx of  . Hepatitis     Hx of  . Urinary frequency   . Urinary tract infection     hx of  . Arthritis     "legs, neck"  . Ulcer, stomach peptic     hx of  . Heart murmur     patient's primary Dr. Cranston Neighbor    Past Surgical History  Procedure Laterality Date  . Back surgery      x3, fusion and rod  . Abdominal hysterectomy    . Knee arthroscopy  2007    right  . Abdominal surgery    . Tubal ligation    . Dilation and curettage of uterus    . Breast lumpectomy      right side  . Posterior cervical fusion/foraminotomy  11/13/2011    Procedure: POSTERIOR CERVICAL FUSION/FORAMINOTOMY LEVEL 1;  Surgeon: Carmela Hurt, MD;  Location: [facility];  Service: Neurosurgery;  Laterality: N/A;  Cervical one Cervical two posterior transpedicular fusion with lateral mass screws    Social History   Social History  . Marital Status: Divorced    Spouse Name: N/A  . Number of Children: N/A  . Years of Education: N/A   Occupational History  . Not on file.   Social History Main Topics  . Smoking status: Never Smoker   . Smokeless tobacco: Not on file  . Alcohol Use: No  . Drug Use: No  . Sexual Activity: Not on file   Other Topics Concern  . Not on file    Social History Narrative   Family History  Problem Relation Age of Onset  . Arthritis    . Hyperlipidemia    . Hypertension        VITAL SIGNS BP 128/68 mmHg  Pulse 54  Temp(Src) 97.5 F (36.4 C) (Oral)  Resp 18  Ht  (1.6 m)  Wt 160 lb (72.576 kg)  BMI 28.35 kg/m2  SpO2 97%  Patient's Medications  New Prescriptions   No medications on file  Previous Medications   ASCORBIC ACID (VITAMIN C) 500 MG TABLET    Take 500 mg by mouth 2 (two) times daily.   ATORVASTATIN (LIPITOR) 20 MG TABLET    Take 20 mg by mouth daily at 6 PM.   CITALOPRAM (CELEXA) 10 MG TABLET    Take 10 mg by mouth every morning.    CYANOCOBALAMIN (B-12) 1000 MCG SUBL    Place 1,000 mcg under the tongue daily.   DONEPEZIL (ARICEPT) 10 MG TABLET    Take 10 mg by mouth at bedtime.    FLUTICASONE (  FLONASE) 50 MCG/ACT NASAL SPRAY    Place 2 sprays into both nostrils daily.   GABAPENTIN (NEURONTIN) 300 MG CAPSULE    Take 300 mg by mouth 3 (three) times daily.    HYDROCHLOROTHIAZIDE (,MICROZIDE/HYDRODIURIL,) 12.5 MG CAPSULE    Take 12.5 mg by mouth every morning.    HYDROCODONE-ACETAMINOPHEN (NORCO) 10-325 MG TABLET    Take 1 tablet by mouth 2 (two) times daily. Take 1 tablet by mouth daily as needed for pain.   LEVOTHYROXINE (SYNTHROID, LEVOTHROID) 125 MCG TABLET    Take 125 mcg by mouth daily before breakfast.   LOPERAMIDE (IMODIUM) 2 MG CAPSULE    Take 2 mg by mouth 4 (four) times daily as needed for diarrhea or loose stools.   LORAZEPAM (ATIVAN) 0.5 MG TABLET    Take 1 tablet (0.5 mg total) by mouth 3 (three) times daily.   MIRTAZAPINE (REMERON) 7.5 MG TABLET    Take 7.5 mg by mouth at bedtime.   OMEPRAZOLE (PRILOSEC) 20 MG CAPSULE    Take 20 mg by mouth daily.   PHENOL (CHLORASEPTIC) 1.4 % LIQD    Use as directed 2 sprays in the mouth or throat every 4 (four) hours as needed (for sore throat).    POLYETHYLENE GLYCOL (MIRALAX / GLYCOLAX) PACKET    Take 17 g by mouth daily.   POTASSIUM CHLORIDE SA  (K-DUR,KLOR-CON) 20 MEQ TABLET    Take 20 mEq by mouth daily with breakfast. Do not  Crush. Take with food.   SENNA (SENOKOT) 8.6 MG TABS TABLET    Take 1 tablet by mouth at bedtime.   TIZANIDINE (ZANAFLEX) 2 MG TABLET    Take 1 tablet (2 mg total) by mouth 3 (three) times daily.   ZINC OXIDE 20 % OINTMENT    Apply 1 application topically daily. Apply to bilateral buttocks every day shift  Modified Medications   No medications on file  Discontinued Medications   BENZONATATE (TESSALON) 200 MG CAPSULE    Take 200 mg by mouth every 6 (six) hours as needed for cough. Reported on 01/15/2016   HYDROCODONE-ACETAMINOPHEN (NORCO) 10-325 MG TABLET    Take 1 tablet by mouth at bedtime. Reported on 01/15/2016   SKIN PROTECTANTS, MISC. (CALAZIME SKIN PROTECTANT) PSTE    Apply topically. Reported on 01/15/2016     SIGNIFICANT DIAGNOSTIC EXAMS  03-07-15: chest x-ray: No active cardiopulmonary disease.  03-07-15: ct of cervical spine: No evidence for acute cervical spine fracture.   LABS REVIEWED:   03-07-15: wbc 4.4; hgb 14.9; hct 45.8; mcv 99.1; plt 211; glucose 93; bun 7; creat 0.68; k+4.1; na++141; liver normal albumin 4.4; urine culture: klebsiella pneumoniae  03-19-15: wbc 4.4; hgb 13.7; hct 41.2; mcv 95.8; plt 202; glucose 81; bun 9; creat 0.65; k+ 3.5; na++140; t bili 1.5; albumin 3.8; chol 113; ldl 49; trig 56; hdl 53; tsh 8.640 free t4: 1.0  04-16-15: urine culture: e-coli: amoxicillin 05-08-15: chol 103; ldl 2; trig 59; hdl 39; tsh 0.037; free t4: 2.26 05-17-15: tsh <0.008  06-12-15: liver normal albumin 3.4; chol 131; ldl 66; trig 72; hdl 51 06-21-15: tsh 0.464; free T4: 1.22  09-13-15: wbc 4.2; hgb 13.4;hct 40.6;  mcv 97.6 plt 97.6 plt 210; glucose 70; bun 17; creat 0.56; k+ 3.5; na++139; liver normal albumin 3.2; chol 119; ldl 50; trig 85; hdl 52; tsh 9.74; vit B12: 764; folate 7.1  10-13-15: tsh 2.82      Review of Systems Unable to perform ROS: Dementia    Physical  Exam Vitals  reviewed. Constitutional: No distress.  Overweight   Eyes: Conjunctivae are normal.  Neck: Neck supple. No JVD present. No thyromegaly present.  Cardiovascular: Normal rate, regular rhythm and intact distal pulses.   Respiratory: Effort normal and breath sounds normal. No respiratory distress. She has no wheezes.  GI: Soft. Bowel sounds are normal. She exhibits no distension. There is no tenderness.  Musculoskeletal: She exhibits no edema.  Able to move all extremities  Has limited range of motion in neck   Lymphadenopathy:    She has no cervical adenopathy.  Neurological: She is alert.  Skin: Skin is warm and dry. She is not diaphoretic.  Psychiatric: She has a normal mood and affect.       ASSESSMENT/ PLAN:   1. Hypothyroidism: will continue  synthroid 125 mcg daily her tsh is 2.82  2. Dyslipidemia:  Her ldl is 50; will continue  lipitor 20 mg daily.   3. 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 Will increase her neurontin to 400 mg three times daily for 5 days then 600 mg three times daily; for her tremors. If this is not effective will consider using requip or primidone. Will monitor    4. Frontotemporal dementia: no change in status will continue aricept 10 mg daily her current weight is 160 pounds  5. Hypertension: will continue hctz 12.5 mg daily   6. GERD: will continue prilosec 20 mg daily   7. Constipation: will continue  senna daily   Will increase miralax to twice daily   8. Depression: will continue celexa 10 mg daily; will continue ativan 0.5 mg three times daily  for anxiety   9. Allergic rhinitis: will continue flonase daily   10. Viral hepatitis C without coma: no change in status; will monitor  11. Weight loss: her weight this month is 160  pounds; will not make changes at this time; will continue to monitor  her status and will make changes in the future as indicated.   12. Hypokalemia: will continue k+ 20 meq daily   Will check tsh and lipids   Synthia Innocenteborah Lenardo Westwood NP [facility]  Contact [phone] Monday through Friday 8am- 5pm  After hours call [phone]

## 2016-01-17 LAB — LIPID PANEL
Cholesterol: 169 mg/dL (ref 0–200)
HDL: 63 mg/dL (ref 35–70)
LDL Cholesterol: 84 mg/dL
Triglycerides: 102 mg/dL (ref 40–160)

## 2016-01-17 LAB — TSH: TSH: 0.7 u[IU]/mL (ref 0.41–5.90)

## 2016-02-19 ENCOUNTER — Non-Acute Institutional Stay (SKILLED_NURSING_FACILITY): Payer: Medicare Other | Admitting: Adult Health

## 2016-02-19 ENCOUNTER — Encounter: Payer: Self-pay | Admitting: Adult Health

## 2016-02-19 DIAGNOSIS — I10 Essential (primary) hypertension: Secondary | ICD-10-CM

## 2016-02-19 DIAGNOSIS — B182 Chronic viral hepatitis C: Secondary | ICD-10-CM | POA: Diagnosis not present

## 2016-02-19 DIAGNOSIS — E034 Atrophy of thyroid (acquired): Secondary | ICD-10-CM

## 2016-02-19 DIAGNOSIS — M47812 Spondylosis without myelopathy or radiculopathy, cervical region: Secondary | ICD-10-CM | POA: Diagnosis not present

## 2016-02-19 DIAGNOSIS — G3109 Other frontotemporal dementia: Secondary | ICD-10-CM | POA: Diagnosis not present

## 2016-02-19 DIAGNOSIS — F418 Other specified anxiety disorders: Secondary | ICD-10-CM | POA: Diagnosis not present

## 2016-02-19 DIAGNOSIS — E038 Other specified hypothyroidism: Secondary | ICD-10-CM

## 2016-02-19 DIAGNOSIS — E785 Hyperlipidemia, unspecified: Secondary | ICD-10-CM | POA: Diagnosis not present

## 2016-02-19 DIAGNOSIS — F0280 Dementia in other diseases classified elsewhere without behavioral disturbance: Secondary | ICD-10-CM | POA: Diagnosis not present

## 2016-02-19 DIAGNOSIS — G252 Other specified forms of tremor: Secondary | ICD-10-CM | POA: Diagnosis not present

## 2016-02-19 NOTE — Progress Notes (Signed)
Patient ID: Mallory Burke, female   DOB: Jul 21, 1932, 82 y.o.   MRN: [id number]   Location:   [facility] Room Number: 153-B Place of Service:  SNF (31)   CODE STATUS: Full Code  No Known Allergies  Chief Complaint  Patient presents with  . Medical Management of Chronic Issues    Follow up    HPI:  She is a long term resident of this facility being seen for the management of her chronic illnesses. Overall there is little change in her status. She does spend nearly all of her time in bed per her choice. Her weight is stable at 160 pounds. She is unable to fully participate in the hpi or ros. There are no nursing concerns at this time.   Past Medical History:  Diagnosis Date  . Anemia   . Arthritis    "legs, neck"  . Blood transfusion    hx of  . Bronchitis    hx of  . Chicken pox   . Depression   . Heart murmur    patient's primary Dr. Cranston Neighbor  . Hepatitis    Hx of  . Hyperlipidemia   . Hypothyroid   . Ulcer, stomach peptic    hx of  . Urinary frequency   . Urinary tract infection    hx of    Past Surgical History:  Procedure Laterality Date  . ABDOMINAL HYSTERECTOMY    . ABDOMINAL SURGERY    . BACK SURGERY     x3, fusion and rod  . BREAST LUMPECTOMY     right side  . DILATION AND CURETTAGE OF UTERUS    . KNEE ARTHROSCOPY  2007   right  . POSTERIOR CERVICAL FUSION/FORAMINOTOMY  11/13/2011   Procedure: POSTERIOR CERVICAL FUSION/FORAMINOTOMY LEVEL 1;  Surgeon: Carmela Hurt, MD;  Location: [facility];  Service: Neurosurgery;  Laterality: N/A;  Cervical one Cervical two posterior transpedicular fusion with lateral mass screws  . TUBAL LIGATION      Social History   Social History  . Marital status: Divorced    Spouse name: N/A  . Number of children: N/A  . Years of education: N/A   Occupational History  . Not on file.   Social History Main Topics  . Smoking status: Never Smoker  . Smokeless tobacco: Not on file  . Alcohol use No  .  Drug use: No  . Sexual activity: Not on file   Other Topics Concern  . Not on file   Social History Narrative  . No narrative on file   Family History  Problem Relation Age of Onset  . Arthritis    . Hyperlipidemia    . Hypertension        VITAL SIGNS BP (!) 148/75   Pulse 78   Temp 98.2 F (36.8 C) (Oral)   Resp 16   Ht 5\' 3"  (1.6 m)   Wt 160 lb 2 oz (72.6 kg)   SpO2 95%   BMI 28.36 kg/m   Patient's Medications  New Prescriptions   No medications on file  Previous Medications   ASCORBIC ACID (VITAMIN C) 500 MG TABLET    Take 500 mg by mouth 2 (two) times daily.   ATORVASTATIN (LIPITOR) 20 MG TABLET    Take 20 mg by mouth daily at 6 PM.   CITALOPRAM (CELEXA) 10 MG TABLET    Take 10 mg by mouth every morning.    CYANOCOBALAMIN (B-12) 1000 MCG SUBL  Place 1,000 mcg under the tongue daily.   DONEPEZIL (ARICEPT) 10 MG TABLET    Take 10 mg by mouth at bedtime.    FLUTICASONE (FLONASE) 50 MCG/ACT NASAL SPRAY    Place 2 sprays into both nostrils daily.   GABAPENTIN (NEURONTIN) 300 MG CAPSULE    Take 600 mg by mouth 3 (three) times daily.    HYDROCHLOROTHIAZIDE (,MICROZIDE/HYDRODIURIL,) 12.5 MG CAPSULE    Take 12.5 mg by mouth every morning.    HYDROCODONE-ACETAMINOPHEN (NORCO) 10-325 MG TABLET    Take 1 tablet by mouth 2 (two) times daily. Take 1 tablet by mouth daily as needed for pain.   LEVOTHYROXINE (SYNTHROID, LEVOTHROID) 125 MCG TABLET    Take 125 mcg by mouth daily before breakfast.   LOPERAMIDE (IMODIUM) 2 MG CAPSULE    Take 2 mg by mouth 4 (four) times daily as needed for diarrhea or loose stools.   LORAZEPAM (ATIVAN) 0.5 MG TABLET    Take 1 tablet (0.5 mg total) by mouth 3 (three) times daily.   MIRTAZAPINE (REMERON) 7.5 MG TABLET    Take 7.5 mg by mouth at bedtime.   OMEPRAZOLE (PRILOSEC) 20 MG CAPSULE    Take 20 mg by mouth daily.   PHENOL (CHLORASEPTIC) 1.4 % LIQD    Use as directed 2 sprays in the mouth or throat every 4 (four) hours as needed (for sore  throat).    POLYETHYLENE GLYCOL (MIRALAX / GLYCOLAX) PACKET    Take 17 g by mouth daily.   POTASSIUM CHLORIDE SA (K-DUR,KLOR-CON) 20 MEQ TABLET    Take 20 mEq by mouth daily with breakfast. Do not  Crush. Take with food.   SENNA (SENOKOT) 8.6 MG TABS TABLET    Take 2 tablets by mouth 2 (two) times daily.    TIZANIDINE (ZANAFLEX) 2 MG TABLET    Take 1 tablet (2 mg total) by mouth 3 (three) times daily.   ZINC OXIDE 20 % OINTMENT    Apply 1 application topically daily. Apply to bilateral buttocks every day shift  Modified Medications   No medications on file  Discontinued Medications   No medications on file     SIGNIFICANT DIAGNOSTIC EXAMS  03-07-15: chest x-ray: No active cardiopulmonary disease.  03-07-15: ct of cervical spine: No evidence for acute cervical spine fracture.   LABS REVIEWED:   03-07-15: wbc 4.4; hgb 14.9; hct 45.8; mcv 99.1; plt 211; glucose 93; bun 7; creat 0.68; k+4.1; na++141; liver normal albumin 4.4; urine culture: klebsiella pneumoniae  03-19-15: wbc 4.4; hgb 13.7; hct 41.2; mcv 95.8; plt 202; glucose 81; bun 9; creat 0.65; k+ 3.5; na++140; t bili 1.5; albumin 3.8; chol 113; ldl 49; trig 56; hdl 53; tsh 8.640 free t4: 1.0  04-16-15: urine culture: e-coli: amoxicillin 05-08-15: chol 103; ldl 2; trig 59; hdl 39; tsh 0.037; free t4: 2.26 05-17-15: tsh <0.008  06-12-15: liver normal albumin 3.4; chol 131; ldl 66; trig 72; hdl 51 06-21-15: tsh 0.464; free T4: 1.22  09-13-15: wbc 4.2; hgb 13.4;hct 40.6;  mcv 97.6 plt 97.6 plt 210; glucose 70; bun 17; creat 0.56; k+ 3.5; na++139; liver normal albumin 3.2; chol 119; ldl 50; trig 85; hdl 52; tsh 9.74; vit B12: 764; folate 7.1  10-13-15: tsh 2.82  01-17-16: chol 167; ldl 84; trig 102; hdl 63    Review of Systems Unable to perform ROS: Dementia    Physical Exam Vitals reviewed. Constitutional: No distress.  Overweight   Eyes: Conjunctivae are normal.  Neck: Neck supple.  No JVD present. No thyromegaly present.    Cardiovascular: Normal rate, regular rhythm and intact distal pulses.   Respiratory: Effort normal and breath sounds normal. No respiratory distress. She has no wheezes.  GI: Soft. Bowel sounds are normal. She exhibits no distension. There is no tenderness.  Musculoskeletal: She exhibits no edema.  Able to move all extremities  Has limited range of motion in neck   Lymphadenopathy:    She has no cervical adenopathy.  Neurological: She is alert.  Skin: Skin is warm and dry. She is not diaphoretic.  Psychiatric: She has a normal mood and affect.       ASSESSMENT/ PLAN:   1. Hypothyroidism: will continue  synthroid 125 mcg daily her tsh is 0.70  2. Dyslipidemia:  Her ldl is 84; will continue  lipitor 20 mg daily.   3. 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  Will continue  neurontin 600 mg three times daily; for her tremors. I  4. Frontotemporal dementia: no change in status will continue aricept 10 mg daily her current weight is 160 pounds  5. Hypertension: will continue hctz 12.5 mg daily   6. GERD: will continue prilosec 20 mg daily   7. Constipation: will continue  senna daily    miralax  twice daily   8. Depression: will continue celexa 10 mg daily; will continue ativan 0.5 mg three times daily  for anxiety   9. Allergic rhinitis: will continue flonase daily   10. Viral hepatitis C without coma: no change in status; will monitor  11. Weight loss: her weight this month is 160  pounds; will not make changes at this time; will continue to monitor her status and will make changes in the future as indicated.   12. Hypokalemia: will continue k+ 20 meq daily     MD is aware of resident's narcotic use and is in agreement with current plan of care. We will attempt to wean resident as apropriate   Synthia Innocenteborah Pasqualino Witherspoon NP [facility]  Contact [phone] Monday through Friday 8am- 5pm  After hours call [phone]

## 2016-04-04 ENCOUNTER — Encounter: Payer: Self-pay | Admitting: Adult Health

## 2016-04-04 ENCOUNTER — Non-Acute Institutional Stay (SKILLED_NURSING_FACILITY): Payer: Medicare Other | Admitting: Adult Health

## 2016-04-04 DIAGNOSIS — G3109 Other frontotemporal dementia: Secondary | ICD-10-CM | POA: Diagnosis not present

## 2016-04-04 DIAGNOSIS — B182 Chronic viral hepatitis C: Secondary | ICD-10-CM

## 2016-04-04 DIAGNOSIS — F418 Other specified anxiety disorders: Secondary | ICD-10-CM | POA: Diagnosis not present

## 2016-04-04 DIAGNOSIS — E785 Hyperlipidemia, unspecified: Secondary | ICD-10-CM | POA: Diagnosis not present

## 2016-04-04 DIAGNOSIS — I10 Essential (primary) hypertension: Secondary | ICD-10-CM

## 2016-04-04 DIAGNOSIS — F0280 Dementia in other diseases classified elsewhere without behavioral disturbance: Secondary | ICD-10-CM

## 2016-04-04 DIAGNOSIS — M47812 Spondylosis without myelopathy or radiculopathy, cervical region: Secondary | ICD-10-CM | POA: Diagnosis not present

## 2016-04-04 DIAGNOSIS — E034 Atrophy of thyroid (acquired): Secondary | ICD-10-CM | POA: Diagnosis not present

## 2016-04-04 DIAGNOSIS — R634 Abnormal weight loss: Secondary | ICD-10-CM | POA: Diagnosis not present

## 2016-04-04 NOTE — Progress Notes (Signed)
Patient ID: [place] SinkHelen Burke, female   DOB: 1933-01-28, 82 y.o.   MRN: [id number]    Location:   [facility] Room Number: 153-B Place of Service:  SNF (31)   CODE STATUS: Full Code  No Known Allergies  Chief Complaint  Patient presents with  . Medical Management of Chronic Issues    Follow up    HPI:  She is a long term resident of this facility being seen for the management of her chronic illnesses. Overall there is little change in her status. She is getting out of bed nearly every day. She is unable to fully participate in the hpi or ros. There are no nursing concerns at this time.    Past Medical History:  Diagnosis Date  . Anemia   . Arthritis    "legs, neck"  . Blood transfusion    hx of  . Bronchitis    hx of  . Chicken pox   . Depression   . Heart murmur    patient's primary Dr. Cranston Burke  . Hepatitis    Hx of  . Hyperlipidemia   . Hypothyroid   . Ulcer, stomach peptic    hx of  . Urinary frequency   . Urinary tract infection    hx of    Past Surgical History:  Procedure Laterality Date  . ABDOMINAL HYSTERECTOMY    . ABDOMINAL SURGERY    . BACK SURGERY     x3, fusion and rod  . BREAST LUMPECTOMY     right side  . DILATION AND CURETTAGE OF UTERUS    . KNEE ARTHROSCOPY  2007   right  . POSTERIOR CERVICAL FUSION/FORAMINOTOMY  11/13/2011   Procedure: POSTERIOR CERVICAL FUSION/FORAMINOTOMY LEVEL 1;  Surgeon: Carmela HurtKyle L Cabbell, MD;  Location: [facility];  Service: Neurosurgery;  Laterality: N/A;  Cervical one Cervical two posterior transpedicular fusion with lateral mass screws  . TUBAL LIGATION      Social History   Social History  . Marital status: Divorced    Spouse name: N/A  . Number of children: N/A  . Years of education: N/A   Occupational History  . Not on file.   Social History Main Topics  . Smoking status: Never Smoker  . Smokeless tobacco: Not on file  . Alcohol use No  . Drug use: No  . Sexual activity: Not on file    Other Topics Concern  . Not on file   Social History Narrative  . No narrative on file   Family History  Problem Relation Age of Onset  . Arthritis    . Hyperlipidemia    . Hypertension        VITAL SIGNS There were no vitals taken for this visit.  Patient's Medications  New Prescriptions   No medications on file  Previous Medications   ASCORBIC ACID (VITAMIN C) 500 MG TABLET    Take 500 mg by mouth 2 (two) times daily.   ATORVASTATIN (LIPITOR) 20 MG TABLET    Take 20 mg by mouth daily at 6 PM.   CITALOPRAM (CELEXA) 10 MG TABLET    Take 10 mg by mouth every morning.    CYANOCOBALAMIN (B-12) 1000 MCG SUBL    Place 1,000 mcg under the tongue daily.   DONEPEZIL (ARICEPT) 10 MG TABLET    Take 10 mg by mouth at bedtime.    FLUTICASONE (FLONASE) 50 MCG/ACT NASAL SPRAY    Place 2 sprays into both nostrils daily.  GABAPENTIN (NEURONTIN) 300 MG CAPSULE    Take 600 mg by mouth 3 (three) times daily.    HYDROCHLOROTHIAZIDE (,MICROZIDE/HYDRODIURIL,) 12.5 MG CAPSULE    Take 12.5 mg by mouth every morning.    HYDROCODONE-ACETAMINOPHEN (NORCO) 10-325 MG TABLET    Take 1 tablet by mouth 2 (two) times daily. Take 1 tablet by mouth daily as needed for pain.   LEVOTHYROXINE (SYNTHROID, LEVOTHROID) 125 MCG TABLET    Take 125 mcg by mouth daily before breakfast.   LOPERAMIDE (IMODIUM) 2 MG CAPSULE    Take 2 mg by mouth 4 (four) times daily as needed for diarrhea or loose stools.   LORAZEPAM (ATIVAN) 0.5 MG TABLET    Take 1 tablet (0.5 mg total) by mouth 3 (three) times daily.   MIRTAZAPINE (REMERON) 7.5 MG TABLET    Take 7.5 mg by mouth at bedtime.   OMEPRAZOLE (PRILOSEC) 20 MG CAPSULE    Take 20 mg by mouth daily.   PHENOL (CHLORASEPTIC) 1.4 % LIQD    Use as directed 2 sprays in the mouth or throat every 4 (four) hours as needed (for sore throat).    POLYETHYLENE GLYCOL (MIRALAX / GLYCOLAX) PACKET    Take 17 g by mouth daily.   POTASSIUM CHLORIDE SA (K-DUR,KLOR-CON) 20 MEQ TABLET    Take 20  mEq by mouth daily with breakfast. Do not  Crush. Take with food.   SENNA (SENOKOT) 8.6 MG TABS TABLET    Take 2 tablets by mouth 2 (two) times daily.    TIZANIDINE (ZANAFLEX) 2 MG TABLET    Take 1 tablet (2 mg total) by mouth 3 (three) times daily.   ZINC OXIDE 20 % OINTMENT    Apply 1 application topically daily. Apply to bilateral buttocks every day shift  Modified Medications   No medications on file  Discontinued Medications   No medications on file     SIGNIFICANT DIAGNOSTIC EXAMS  03-07-15: chest x-ray: No active cardiopulmonary disease.  03-07-15: ct of cervical spine: No evidence for acute cervical spine fracture.   LABS REVIEWED:   04-16-15: urine culture: e-coli: amoxicillin 05-08-15: chol 103; ldl 2; trig 59; hdl 39; tsh 0.037; free t4: 2.26 05-17-15: tsh <0.008  06-12-15: liver normal albumin 3.4; chol 131; ldl 66; trig 72; hdl 51 06-21-15: tsh 0.464; free T4: 1.22  09-13-15: wbc 4.2; hgb 13.4;hct 40.6;  mcv 97.6 plt 97.6 plt 210; glucose 70; bun 17; creat 0.56; k+ 3.5; na++139; liver normal albumin 3.2; chol 119; ldl 50; trig 85; hdl 52; tsh 9.74; vit B12: 764; folate 7.1  10-13-15: tsh 2.82  01-17-16: chol 167; ldl 84; trig 102; hdl 63    Review of Systems Unable to perform ROS: Dementia    Physical Exam Vitals reviewed. Constitutional: No distress.  Overweight   Eyes: Conjunctivae are normal.  Neck: Neck supple. No JVD present. No thyromegaly present.  Cardiovascular: Normal rate, regular rhythm and intact distal pulses.   Respiratory: Effort normal and breath sounds normal. No respiratory distress. She has no wheezes.  GI: Soft. Bowel sounds are normal. She exhibits no distension. There is no tenderness.  Musculoskeletal: She exhibits no edema.  Able to move all extremities  Has limited range of motion in neck   Lymphadenopathy:    She has no cervical adenopathy.  Neurological: She is alert.  Skin: Skin is warm and dry. She is not diaphoretic.  Psychiatric:  She has a normal mood and affect.     ASSESSMENT/ PLAN:  1. Hypothyroidism: will continue  synthroid 125 mcg daily her tsh is 0.70  2. Dyslipidemia:  Her ldl is 84; will continue  lipitor 20 mg daily.   3. 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  Will continue  neurontin 600 mg three times daily; for her tremors. I  4. Frontotemporal dementia: no change in status will continue aricept 10 mg daily her current weight is 160 pounds  5. Hypertension: will continue hctz 12.5 mg daily   6. GERD: will continue prilosec 20 mg daily   7. Constipation: will continue  senna daily    miralax  twice daily   8. Depression: will continue celexa 10 mg daily; will continue ativan 0.5 mg three times daily  for anxiety   9. Allergic rhinitis: will continue flonase daily   10. Viral hepatitis C without coma: no change in status; will monitor  11. Weight loss: her weight this month is 160  pounds; will not make changes at this time; will continue to monitor her status and will make changes in the future as indicated.   12. Hypokalemia: will continue k+ 20 meq daily    Will check cbc; cmp     MD is aware of resident's narcotic use and is in agreement with current plan of care. We will attempt to wean resident as apropriate   Synthia Innocent NP [facility]  Contact [phone] Monday through Friday 8am- 5pm  After hours call [phone]

## 2016-04-05 LAB — CBC AND DIFFERENTIAL
HCT: 41 % (ref 36–46)
Hemoglobin: 13.5 g/dL (ref 12.0–16.0)
Neutrophils Absolute: 1200 /uL
PLATELETS: 205 10*3/uL (ref 150–399)
WBC: 4 10*3/mL

## 2016-04-05 LAB — BASIC METABOLIC PANEL
BUN: 12 mg/dL (ref 4–21)
CREATININE: 0.7 mg/dL (ref <=1.1)
POTASSIUM: 4 mmol/L (ref 3.4–5.3)
Sodium: 143 mmol/L (ref 137–147)

## 2016-04-05 LAB — HEPATIC FUNCTION PANEL
ALT: 18 U/L (ref 7–35)
AST: 11 U/L — AB (ref 13–35)
Alkaline Phosphatase: 53 U/L (ref 25–125)
BILIRUBIN, TOTAL: 0.7 mg/dL

## 2016-05-06 ENCOUNTER — Non-Acute Institutional Stay (SKILLED_NURSING_FACILITY): Payer: Medicare Other | Admitting: Adult Health

## 2016-05-06 ENCOUNTER — Encounter: Payer: Self-pay | Admitting: Adult Health

## 2016-05-06 DIAGNOSIS — M47812 Spondylosis without myelopathy or radiculopathy, cervical region: Secondary | ICD-10-CM | POA: Diagnosis not present

## 2016-05-06 DIAGNOSIS — E034 Atrophy of thyroid (acquired): Secondary | ICD-10-CM | POA: Diagnosis not present

## 2016-05-06 DIAGNOSIS — I10 Essential (primary) hypertension: Secondary | ICD-10-CM

## 2016-05-06 DIAGNOSIS — F0280 Dementia in other diseases classified elsewhere without behavioral disturbance: Secondary | ICD-10-CM | POA: Diagnosis not present

## 2016-05-06 DIAGNOSIS — G252 Other specified forms of tremor: Secondary | ICD-10-CM | POA: Diagnosis not present

## 2016-05-06 DIAGNOSIS — F418 Other specified anxiety disorders: Secondary | ICD-10-CM | POA: Diagnosis not present

## 2016-05-06 DIAGNOSIS — B182 Chronic viral hepatitis C: Secondary | ICD-10-CM | POA: Diagnosis not present

## 2016-05-06 DIAGNOSIS — G3109 Other frontotemporal dementia: Secondary | ICD-10-CM | POA: Diagnosis not present

## 2016-05-06 NOTE — Progress Notes (Signed)
Patient ID: Mallory Burke, female   DOB: [birth year], 82 y.o.   MRN: [id number]    Provider:  Synthia Innocent, NP Location:  [facility] Room Number: 153-B Place of Service:  SNF (31)   PCP: No primary care provider on file. No care team member to display  Extended Emergency Contact Information Primary Emergency Contact: [facility] Address: [address]           [place], [place] [address] [place] Home Phone: [phone] Mobile Phone: [phone] Relation: Grandaughter Secondary Emergency Contact: Estelle Grumbles, [place] Home Phone: [phone] Relation: Grandaughter  Code Status: Full Code Goals of Care: Advanced Directive information Advanced Directives 05/06/2016  Does patient have an advance directive? No  Would patient like information on creating an advanced directive? No - patient declined information  Pre-existing out of facility DNR order (yellow form or pink MOST form) -      No Known Allergies   Chief Complaint  Patient presents with  . Annual Exam    Annual    HPI: Patient is a 82 y.o. female seen today for an annual comprehensive examination. She has not had a significant change in her status. She has not been hospitalized over the past year.   Past Medical History:  Diagnosis Date  . Anemia   . Arthritis    "legs, neck"  . Blood transfusion    hx of  . Bronchitis    hx of  . Chicken pox   . Depression   . Heart murmur    patient's primary Dr. Cranston Neighbor  . Hepatitis    Hx of  . Hyperlipidemia   . Hypothyroid   . Ulcer, stomach peptic    hx of  . Urinary frequency   . Urinary tract infection    hx of   Past Surgical History:  Procedure Laterality Date  . ABDOMINAL HYSTERECTOMY    . ABDOMINAL SURGERY    . BACK SURGERY     x3, fusion and rod  . BREAST LUMPECTOMY     right side  . DILATION AND CURETTAGE OF UTERUS    . KNEE ARTHROSCOPY  2007   right  . POSTERIOR  CERVICAL FUSION/FORAMINOTOMY  11/13/2011   Procedure: POSTERIOR CERVICAL FUSION/FORAMINOTOMY LEVEL 1;  Surgeon: Carmela Hurt, MD;  Location: [facility];  Service: Neurosurgery;  Laterality: N/A;  Cervical one Cervical two posterior transpedicular fusion with lateral mass screws  . TUBAL LIGATION      reports that she has never smoked. She does not have any smokeless tobacco history on file. She reports that she does not drink alcohol or use drugs. Social History   Social History  . Marital status: Divorced    Spouse name: N/A  . Number of children: N/A  . Years of education: N/A   Occupational History  . Not on file.   Social History Main Topics  . Smoking status: Never Smoker  . Smokeless tobacco: Not on file  . Alcohol use No  . Drug use: No  . Sexual activity: Not on file   Other Topics Concern  . Not on file   Social History Narrative  . No narrative on file   Family History  Problem Relation Age of Onset  . Arthritis    . Hyperlipidemia    . Hypertension      Vitals:   05/06/16 1137  BP: 132/74  Pulse:  70  Resp: 18  Temp: 98.4 F (36.9 C)  TempSrc: Oral  SpO2: 96%  Weight: 160 lb (72.6 kg)  Height: 5\' 3"  (1.6 m)   Body mass index is 28.34 kg/m.    Medication List       Accurate as of 05/06/16 11:47 AM. Always use your most recent med list.          ascorbic acid 500 MG tablet Commonly known as:  VITAMIN C Take 500 mg by mouth 2 (two) times daily.   atorvastatin 20 MG tablet Commonly known as:  LIPITOR Take 20 mg by mouth daily at 6 PM.   B-12 1000 MCG Subl Place 1,000 mcg under the tongue daily.   citalopram 10 MG tablet Commonly known as:  CELEXA Take 10 mg by mouth every morning.   donepezil 10 MG tablet Commonly known as:  ARICEPT Take 10 mg by mouth at bedtime.   gabapentin 300 MG capsule Commonly known as:  NEURONTIN Take 600 mg by mouth 3 (three) times daily.   hydrochlorothiazide 12.5 MG capsule Commonly known as:   MICROZIDE Take 12.5 mg by mouth every morning.   HYDROcodone-acetaminophen 10-325 MG tablet Commonly known as:  NORCO Take 1 tablet by mouth 2 (two) times daily. Take 1 tablet by mouth daily as needed for pain.   levothyroxine 125 MCG tablet Commonly known as:  SYNTHROID, LEVOTHROID Take 125 mcg by mouth daily before breakfast.   loperamide 2 MG capsule Commonly known as:  IMODIUM Take 2 mg by mouth 4 (four) times daily as needed for diarrhea or loose stools.   mirtazapine 7.5 MG tablet Commonly known as:  REMERON Take 7.5 mg by mouth at bedtime.   phenol 1.4 % Liqd Commonly known as:  CHLORASEPTIC Use as directed 2 sprays in the mouth or throat every 4 (four) hours as needed (for sore throat).   polyethylene glycol packet Commonly known as:  MIRALAX / GLYCOLAX Take 17 g by mouth daily.   potassium chloride SA 20 MEQ tablet Commonly known as:  K-DUR,KLOR-CON Take 20 mEq by mouth daily with breakfast. Do not  Crush. Take with food.   senna 8.6 MG Tabs tablet Commonly known as:  SENOKOT Take 2 tablets by mouth 2 (two) times daily.   tiZANidine 2 MG tablet Commonly known as:  ZANAFLEX Take 1 tablet (2 mg total) by mouth 3 (three) times daily.        SIGNIFICANT DIAGNOSTIC EXAMS   03-07-15: chest x-ray: No active cardiopulmonary disease.  03-07-15: ct of cervical spine: No evidence for acute cervical spine fracture.   LABS REVIEWED:   04-16-15: urine culture: e-coli: amoxicillin 05-08-15: chol 103; ldl 2; trig 59; hdl 39; tsh 0.037; free t4: 2.26 05-17-15: tsh <0.008  06-12-15: liver normal albumin 3.4; chol 131; ldl 66; trig 72; hdl 51 06-21-15: tsh 0.464; free T4: 1.22  09-13-15: wbc 4.2; hgb 13.4;hct 40.6;  mcv 97.6 plt 97.6 plt 210; glucose 70; bun 17; creat 0.56; k+ 3.5; na++139; liver normal albumin 3.2; chol 119; ldl 50; trig 85; hdl 52; tsh 9.74; vit B12: 764; folate 7.1  10-13-15: tsh 2.82  01-17-16: chol 167; ldl 84; trig 102; hdl 63 04-05-16: wbc 4.0; hb 13.5;  hct 41.1; mcv 95.4; plt 205; glucose 88; bun 12; creat 0.72; k+ 4.0; na++ 143; liver normal albumin 3.8     Review of Systems Unable to perform ROS: Dementia    Physical Exam Vitals reviewed. Constitutional: No distress.  Overweight  Eyes: Conjunctivae are normal.  Neck: Neck supple. No JVD present. No thyromegaly present.  Cardiovascular: Normal rate, regular rhythm and intact distal pulses.   Respiratory: Effort normal and breath sounds normal. No respiratory distress. She has no wheezes.  GI: Soft. Bowel sounds are normal. She exhibits no distension. There is no tenderness.  Musculoskeletal: She exhibits no edema.  Able to move all extremities  Has limited range of motion in neck Has chronic gross tremors of upper extremities.    Lymphadenopathy:    She has no cervical adenopathy.  Neurological: She is alert.  Skin: Skin is warm and dry. She is not diaphoretic.  Psychiatric: She has a normal mood and affect.     ASSESSMENT/ PLAN:   1. Hypothyroidism: will continue  synthroid 125 mcg daily her tsh is 0.70  2. Dyslipidemia:  Her ldl is 84; will continue  lipitor 20 mg daily.   3. 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  Will continue  neurontin 600 mg three times daily; for her tremors. I  4. Frontotemporal dementia: no change in status will continue aricept 10 mg daily her current weight is 160 pounds  5. Hypertension: will continue hctz 12.5 mg daily   6. GERD: will continue prilosec 20 mg daily   7. Constipation: will continue  senna daily    miralax  twice daily   8. Depression: will continue celexa 10 mg daily; will continue ativan 0.5 mg three times daily  for anxiety   9. Allergic rhinitis: will continue flonase daily   10. Viral hepatitis C without coma: no change in status; will monitor  11. Weight loss: her  weight this month is 160  pounds; will not make changes at this time; will continue to monitor her status and will make changes in the future as indicated.   12. Hypokalemia: will continue k+ 20 meq daily    Time spent with patient  45  minutes >50% time spent counseling; reviewing medical record; tests; labs; and developing future plan of care     Synthia Innocenteborah Daundre Biel NP [facility]  Contact [phone] Monday through Friday 8am- 5pm  After hours call [phone]

## 2016-06-03 ENCOUNTER — Other Ambulatory Visit: Payer: Self-pay

## 2016-06-03 MED ORDER — HYDROCODONE-ACETAMINOPHEN 10-325 MG PO TABS: 1.0000 | ORAL_TABLET | Freq: Two times a day (BID) | ORAL | 0 refills | Status: DC

## 2016-06-03 NOTE — Telephone Encounter (Signed)
RX faxed to [facility] @ [phone], phone number [phone] 

## 2016-06-04 ENCOUNTER — Other Ambulatory Visit: Payer: Self-pay | Admitting: *Deleted

## 2016-06-04 MED ORDER — HYDROCODONE-ACETAMINOPHEN 10-325 MG PO TABS: ORAL_TABLET | ORAL | 0 refills | Status: DC

## 2016-06-04 NOTE — Telephone Encounter (Signed)
[facility] #: [phone] Fax#: [phone]  

## 2016-06-06 ENCOUNTER — Encounter: Payer: Self-pay | Admitting: Adult Health

## 2016-06-06 ENCOUNTER — Non-Acute Institutional Stay (SKILLED_NURSING_FACILITY): Payer: Medicare Other | Admitting: Adult Health

## 2016-06-06 DIAGNOSIS — B182 Chronic viral hepatitis C: Secondary | ICD-10-CM | POA: Diagnosis not present

## 2016-06-06 DIAGNOSIS — F418 Other specified anxiety disorders: Secondary | ICD-10-CM | POA: Diagnosis not present

## 2016-06-06 DIAGNOSIS — G252 Other specified forms of tremor: Secondary | ICD-10-CM

## 2016-06-06 DIAGNOSIS — M47812 Spondylosis without myelopathy or radiculopathy, cervical region: Secondary | ICD-10-CM

## 2016-06-06 DIAGNOSIS — I10 Essential (primary) hypertension: Secondary | ICD-10-CM

## 2016-06-06 DIAGNOSIS — E034 Atrophy of thyroid (acquired): Secondary | ICD-10-CM | POA: Diagnosis not present

## 2016-06-06 DIAGNOSIS — F0280 Dementia in other diseases classified elsewhere without behavioral disturbance: Secondary | ICD-10-CM

## 2016-06-06 DIAGNOSIS — G3109 Other frontotemporal dementia: Secondary | ICD-10-CM

## 2016-06-06 NOTE — Progress Notes (Signed)
Patient ID: Mallory Burke, female   DOB: 08-May-1933, 82 y.o.   MRN: [id number]   Location:   [facility] Room Number: 153-B Place of Service:  SNF (31)   CODE STATUS: Full Code  No Known Allergies  Chief Complaint  Patient presents with  . Medical Management of Chronic Issues    Follow up    HPI:    Past Medical History:  Diagnosis Date  . Anemia   . Arthritis    "legs, neck"  . Blood transfusion    hx of  . Bronchitis    hx of  . Chicken pox   . Depression   . Heart murmur    patient's primary Dr. Cranston Neighbor  . Hepatitis    Hx of  . Hyperlipidemia   . Hypothyroid   . Ulcer, stomach peptic    hx of  . Urinary frequency   . Urinary tract infection    hx of    Past Surgical History:  Procedure Laterality Date  . ABDOMINAL HYSTERECTOMY    . ABDOMINAL SURGERY    . BACK SURGERY     x3, fusion and rod  . BREAST LUMPECTOMY     right side  . DILATION AND CURETTAGE OF UTERUS    . KNEE ARTHROSCOPY  2007   right  . POSTERIOR CERVICAL FUSION/FORAMINOTOMY  11/13/2011   Procedure: POSTERIOR CERVICAL FUSION/FORAMINOTOMY LEVEL 1;  Surgeon: Carmela Hurt, MD;  Location: [facility];  Service: Neurosurgery;  Laterality: N/A;  Cervical one Cervical two posterior transpedicular fusion with lateral mass screws  . TUBAL LIGATION      Social History   Social History  . Marital status: Divorced    Spouse name: N/A  . Number of children: N/A  . Years of education: N/A   Occupational History  . Not on file.   Social History Main Topics  . Smoking status: Never Smoker  . Smokeless tobacco: Not on file  . Alcohol use No  . Drug use: No  . Sexual activity: Not on file   Other Topics Concern  . Not on file   Social History Narrative  . No narrative on file   Family History  Problem Relation Age of Onset  . Arthritis    . Hyperlipidemia    . Hypertension        VITAL SIGNS BP 138/76   Pulse 65   Temp 98.6 F (37 C) (Oral)   Resp 18   Ht  5\' 3"  (1.6 m)   Wt 182 lb 6 oz (82.7 kg)   SpO2 96%   BMI 32.31 kg/m   Patient's Medications  New Prescriptions   No medications on file  Previous Medications   ASCORBIC ACID (VITAMIN C) 500 MG TABLET    Take 500 mg by mouth 2 (two) times daily.   ATORVASTATIN (LIPITOR) 20 MG TABLET    Take 20 mg by mouth daily at 6 PM.   CITALOPRAM (CELEXA) 10 MG TABLET    Take 10 mg by mouth every morning.    CYANOCOBALAMIN (B-12) 1000 MCG SUBL    Place 1,000 mcg under the tongue daily.   DONEPEZIL (ARICEPT) 10 MG TABLET    Take 10 mg by mouth at bedtime.    FAMOTIDINE (PEPCID) 20 MG TABLET    Take 20 mg by mouth 2 (two) times daily.   GABAPENTIN (NEURONTIN) 300 MG CAPSULE    Take 600 mg by mouth 3 (three) times daily.  HYDROCHLOROTHIAZIDE (,MICROZIDE/HYDRODIURIL,) 12.5 MG CAPSULE    Take 12.5 mg by mouth every morning.    HYDROCODONE-ACETAMINOPHEN (NORCO) 10-325 MG TABLET    Take one tablet by mouth once daily as needed for pain; Take one tablet by mouth twice daily for pain   LEVOTHYROXINE (SYNTHROID, LEVOTHROID) 125 MCG TABLET    Take 125 mcg by mouth daily before breakfast.   LOPERAMIDE (IMODIUM) 2 MG CAPSULE    Take 2 mg by mouth 4 (four) times daily as needed for diarrhea or loose stools.   MIRTAZAPINE (REMERON) 7.5 MG TABLET    Take 7.5 mg by mouth at bedtime.   PHENOL (CHLORASEPTIC) 1.4 % LIQD    Use as directed 2 sprays in the mouth or throat every 4 (four) hours as needed (for sore throat).    POLYETHYLENE GLYCOL (MIRALAX / GLYCOLAX) PACKET    Take 17 g by mouth daily.   POTASSIUM CHLORIDE SA (K-DUR,KLOR-CON) 20 MEQ TABLET    Take 20 mEq by mouth daily with breakfast. Do not  Crush. Take with food.   SENNA (SENOKOT) 8.6 MG TABS TABLET    Take 2 tablets by mouth 2 (two) times daily.    TIZANIDINE (ZANAFLEX) 2 MG TABLET    Take 1 tablet (2 mg total) by mouth 3 (three) times daily.  Modified Medications   No medications on file  Discontinued Medications   No medications on file      SIGNIFICANT DIAGNOSTIC EXAMS  03-07-15: chest x-ray: No active cardiopulmonary disease.  03-07-15: ct of cervical spine: No evidence for acute cervical spine fracture.   LABS REVIEWED:   05-17-15: tsh <0.008  06-12-15: liver normal albumin 3.4; chol 131; ldl 66; trig 72; hdl 51 06-21-15: tsh 0.464; free T4: 1.22  09-13-15: wbc 4.2; hgb 13.4;hct 40.6;  mcv 97.6 plt 97.6 plt 210; glucose 70; bun 17; creat 0.56; k+ 3.5; na++139; liver normal albumin 3.2; chol 119; ldl 50; trig 85; hdl 52; tsh 9.74; vit B12: 764; folate 7.1  10-13-15: tsh 2.82  01-17-16: chol 167; ldl 84; trig 102; hdl 63 04-05-16: wbc 4.0; hb 13.5; hct 41.1; mcv 95.4; plt 205; glucose 88; bun 12; creat 0.72; k+ 4.0; na++ 143; liver normal albumin 3.8  05-10-16: mag 1.9    Review of Systems Unable to perform ROS: Dementia    Physical Exam Vitals reviewed. Constitutional: No distress.  Overweight   Eyes: Conjunctivae are normal.  Neck: Neck supple. No JVD present. No thyromegaly present.  Cardiovascular: Normal rate, regular rhythm and intact distal pulses.   Respiratory: Effort normal and breath sounds normal. No respiratory distress. She has no wheezes.  GI: Soft. Bowel sounds are normal. She exhibits no distension. There is no tenderness.  Musculoskeletal: She exhibits no edema.  Able to move all extremities  Has limited range of motion in neck Has chronic gross tremors of upper extremities.    Lymphadenopathy:    She has no cervical adenopathy.  Neurological: She is alert.  Skin: Skin is warm and dry. She is not diaphoretic.  Psychiatric: She has a normal mood and affect.     ASSESSMENT/ PLAN:  1. Hypothyroidism: will continue  synthroid 125 mcg daily her tsh is 0.70  2. Dyslipidemia:  Her ldl is 84; will continue  lipitor 20 mg daily.   3. 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  Will continue  neurontin 600 mg three times daily; for her tremors.   4. Frontotemporal dementia: no change in status will continue aricept 10 mg daily her current weight is stable at 182 pounds.   5. Hypertension: will continue hctz 12.5 mg daily   6. GERD: will continue prilosec 20 mg daily   7. Constipation: will continue  senna daily    miralax  twice daily   8. Depression: will continue celexa 10 mg daily; will continue ativan 0.5 mg three times daily  for anxiety   9. Allergic rhinitis: will continue flonase daily   10. Viral hepatitis C without coma: no change in status; will monitor  11. Hypokalemia: will continue k+ 20 meq daily     MD is aware of resident's narcotic use and is in agreement with current plan of care. We will attempt to wean resident as apropriate   Synthia Innocenteborah Gearlene Godsil NP [facility]  Contact [phone] Monday through Friday 8am- 5pm  After hours call [phone]

## 2016-07-09 ENCOUNTER — Non-Acute Institutional Stay (SKILLED_NURSING_FACILITY): Payer: Medicare Other | Admitting: Adult Health

## 2016-07-09 DIAGNOSIS — E034 Atrophy of thyroid (acquired): Secondary | ICD-10-CM | POA: Diagnosis not present

## 2016-07-09 DIAGNOSIS — G3109 Other frontotemporal dementia: Secondary | ICD-10-CM | POA: Diagnosis not present

## 2016-07-09 DIAGNOSIS — M47812 Spondylosis without myelopathy or radiculopathy, cervical region: Secondary | ICD-10-CM | POA: Diagnosis not present

## 2016-07-09 DIAGNOSIS — G252 Other specified forms of tremor: Secondary | ICD-10-CM | POA: Diagnosis not present

## 2016-07-09 DIAGNOSIS — F0280 Dementia in other diseases classified elsewhere without behavioral disturbance: Secondary | ICD-10-CM | POA: Diagnosis not present

## 2016-07-09 DIAGNOSIS — E785 Hyperlipidemia, unspecified: Secondary | ICD-10-CM

## 2016-07-09 DIAGNOSIS — B182 Chronic viral hepatitis C: Secondary | ICD-10-CM

## 2016-07-09 DIAGNOSIS — I10 Essential (primary) hypertension: Secondary | ICD-10-CM

## 2016-07-09 NOTE — Progress Notes (Signed)
Location:   [facility] Service:  SNF (31)   CODE STATUS: full code  No Known Allergies  Chief Complaint  Patient presents with  . Medical Management of Chronic Issues    HPI:  She is a long term resident of this facility being seen for the management of her chronic illnesses. Overall there is little change in her status. She does get out of bed on occassions. She is unable to fully participate in the hpi or ros. There are no nursing concerns at this time.   Past Medical History:  Diagnosis Date  . Anemia   . Arthritis    "legs, neck"  . Blood transfusion    hx of  . Bronchitis    hx of  . Chicken pox   . Depression   . Heart murmur    patient's primary Dr. Cranston Neighbor  . Hepatitis    Hx of  . Hyperlipidemia   . Hypothyroid   . Ulcer, stomach peptic    hx of  . Urinary frequency   . Urinary tract infection    hx of    Past Surgical History:  Procedure Laterality Date  . ABDOMINAL HYSTERECTOMY    . ABDOMINAL SURGERY    . BACK SURGERY     x3, fusion and rod  . BREAST LUMPECTOMY     right side  . DILATION AND CURETTAGE OF UTERUS    . KNEE ARTHROSCOPY  2007   right  . POSTERIOR CERVICAL FUSION/FORAMINOTOMY  11/13/2011   Procedure: POSTERIOR CERVICAL FUSION/FORAMINOTOMY LEVEL 1;  Surgeon: Carmela Hurt, MD;  Location: [facility];  Service: Neurosurgery;  Laterality: N/A;  Cervical one Cervical two posterior transpedicular fusion with lateral mass screws  . TUBAL LIGATION      Social History   Social History  . Marital status: Divorced    Spouse name: N/A  . Number of children: N/A  . Years of education: N/A   Occupational History  . Not on file.   Social History Main Topics  . Smoking status: Never Smoker  . Smokeless tobacco: Not on file  . Alcohol use No  . Drug use: No  . Sexual activity: Not on file   Other Topics Concern  . Not on file   Social History Narrative  . No narrative on file   Family History  Problem Relation  Age of Onset  . Arthritis    . Hyperlipidemia    . Hypertension        VITAL SIGNS BP 136/78   Pulse 60   Temp 98 F (36.7 C)   Resp 18   Ht 5\' 3"  (1.6 m)   Wt 182 lb 9.6 oz (82.8 kg)   SpO2 94%   BMI 32.35 kg/m   Patient's Medications  New Prescriptions   No medications on file  Previous Medications   ASCORBIC ACID (VITAMIN C) 500 MG TABLET    Take 500 mg by mouth 2 (two) times daily.   ATORVASTATIN (LIPITOR) 10 MG TABLET    Take 10 mg by mouth daily.   CITALOPRAM (CELEXA) 10 MG TABLET    Take 10 mg by mouth every morning.    CYANOCOBALAMIN (B-12) 1000 MCG SUBL    Place 1,000 mcg under the tongue daily.   DONEPEZIL (ARICEPT) 10 MG TABLET    Take 10 mg by mouth at bedtime.    FAMOTIDINE (PEPCID) 20 MG TABLET    Take 20 mg by  mouth 2 (two) times daily.   GABAPENTIN (NEURONTIN) 300 MG CAPSULE    Take 600 mg by mouth 3 (three) times daily.    HYDROCHLOROTHIAZIDE (,MICROZIDE/HYDRODIURIL,) 12.5 MG CAPSULE    Take 12.5 mg by mouth every morning.    HYDROCODONE-ACETAMINOPHEN (NORCO) 10-325 MG TABLET    Take one tablet by mouth once daily as needed for pain; Take one tablet by mouth twice daily for pain   LEVOTHYROXINE (SYNTHROID, LEVOTHROID) 125 MCG TABLET    Take 125 mcg by mouth daily before breakfast.   LOPERAMIDE (IMODIUM) 2 MG CAPSULE    Take 2 mg by mouth 4 (four) times daily as needed for diarrhea or loose stools.   MIRTAZAPINE (REMERON) 7.5 MG TABLET    Take 7.5 mg by mouth at bedtime.   PHENOL (CHLORASEPTIC) 1.4 % LIQD    Use as directed 2 sprays in the mouth or throat every 4 (four) hours as needed (for sore throat).    POLYETHYLENE GLYCOL (MIRALAX / GLYCOLAX) PACKET    Take 17 g by mouth daily.   POTASSIUM CHLORIDE SA (K-DUR,KLOR-CON) 20 MEQ TABLET    Take 20 mEq by mouth daily with breakfast. Do not  Crush. Take with food.   SENNA (SENOKOT) 8.6 MG TABS TABLET    Take 2 tablets by mouth 2 (two) times daily.    TIZANIDINE (ZANAFLEX) 2 MG TABLET    Take 1 tablet (2 mg total)  by mouth 3 (three) times daily.  Modified Medications   No medications on file  Discontinued Medications     SIGNIFICANT DIAGNOSTIC EXAMS   03-07-15: chest x-ray: No active cardiopulmonary disease.  03-07-15: ct of cervical spine: No evidence for acute cervical spine fracture.   LABS REVIEWED:   09-13-15: wbc 4.2; hgb 13.4;hct 40.6;  mcv 97.6 plt 97.6 plt 210; glucose 70; bun 17; creat 0.56; k+ 3.5; na++139; liver normal albumin 3.2; chol 119; ldl 50; trig 85; hdl 52; tsh 9.74; vit B12: 764; folate 7.1  10-13-15: tsh 2.82  01-17-16: chol 167; ldl 84; trig 102; hdl 63 04-05-16: wbc 4.0; hb 13.5; hct 41.1; mcv 95.4; plt 205; glucose 88; bun 12; creat 0.72; k+ 4.0; na++ 143; liver normal albumin 3.8  05-10-16: mag 1.9    Review of Systems Unable to perform ROS: Dementia    Physical Exam Vitals reviewed. Constitutional: No distress.  Overweight   Eyes: Conjunctivae are normal.  Neck: Neck supple. No JVD present. No thyromegaly present.  Cardiovascular: Normal rate, regular rhythm and intact distal pulses.   Respiratory: Effort normal and breath sounds normal. No respiratory distress. She has no wheezes.  GI: Soft. Bowel sounds are normal. She exhibits no distension. There is no tenderness.  Musculoskeletal: She exhibits no edema.  Able to move all extremities  Has limited range of motion in neck Has chronic gross tremors of upper extremities.    Lymphadenopathy:    She has no cervical adenopathy.  Neurological: She is alert.  Skin: Skin is warm and dry. She is not diaphoretic.  Psychiatric: She has a normal mood and affect.     ASSESSMENT/ PLAN:  1. Hypothyroidism: will continue  synthroid 125 mcg daily her tsh is 0.70  2. Dyslipidemia:  Her ldl is 84; will continue  lipitor 10 mg daily.   3. 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  Will continue  neurontin 600 mg three times daily; for her tremors.   4. Frontotemporal dementia: no change in status will continue aricept 10 mg daily her current weight is stable at 182 pounds.   5. Hypertension: will continue hctz 12.5 mg daily   6. GERD: will continue prilosec 20 mg daily   7. Constipation: will continue  senna daily    miralax  twice daily   8. Depression: will continue celexa 10 mg daily;   9. Allergic rhinitis: will continue flonase daily   10. Viral hepatitis C without coma: no change in status; will monitor  11. Hypokalemia: will continue k+ 20 meq daily    will check lipids; tsh free t3 and t4    MD is aware of resident's narcotic use and is in agreement with current plan of care. We will attempt to wean resident as apropriate   Synthia Innocent NP [facility]  Contact [phone] Monday through Friday 8am- 5pm  After hours call [phone]

## 2016-07-11 LAB — LIPID PANEL
CHOLESTEROL: 139 mg/dL (ref 0–200)
HDL: 32 mg/dL — AB (ref 35–70)
LDL Cholesterol: 85 mg/dL
Triglycerides: 111 mg/dL (ref 40–160)

## 2016-07-22 ENCOUNTER — Encounter: Payer: Self-pay | Admitting: Adult Health

## 2016-07-22 ENCOUNTER — Non-Acute Institutional Stay (SKILLED_NURSING_FACILITY): Payer: Medicare Other | Admitting: Adult Health

## 2016-07-22 DIAGNOSIS — L89302 Pressure ulcer of unspecified buttock, stage 2: Secondary | ICD-10-CM

## 2016-07-22 NOTE — Progress Notes (Addendum)
[facility]    Place of Service:  SNF (31)   CODE STATUS: full code   No Known Allergies  Chief Complaint  Patient presents with  . Acute Visit    wound management     HPI:  She has a stage II wound on her sacral area which I have been asked to review. She is presently being treated with duoderm dressing. There are no indications of infection present. She does spend most of her time in bed and on her back per her choice.  There are no reports of pain present.   Past Medical History:  Diagnosis Date  . Anemia   . Arthritis    "legs, neck"  . Blood transfusion    hx of  . Bronchitis    hx of  . Chicken pox   . Depression   . Heart murmur    patient's primary Dr. Cranston Neighborabari  . Hepatitis    Hx of  . Hyperlipidemia   . Hypothyroid   . Ulcer, stomach peptic    hx of  . Urinary frequency   . Urinary tract infection    hx of    Past Surgical History:  Procedure Laterality Date  . ABDOMINAL HYSTERECTOMY    . ABDOMINAL SURGERY    . BACK SURGERY     x3, fusion and rod  . BREAST LUMPECTOMY     right side  . DILATION AND CURETTAGE OF UTERUS    . KNEE ARTHROSCOPY  2007   right  . POSTERIOR CERVICAL FUSION/FORAMINOTOMY  11/13/2011   Procedure: POSTERIOR CERVICAL FUSION/FORAMINOTOMY LEVEL 1;  Surgeon: Carmela HurtKyle L Cabbell, MD;  Location: [facility];  Service: Neurosurgery;  Laterality: N/A;  Cervical one Cervical two posterior transpedicular fusion with lateral mass screws  . TUBAL LIGATION      Social History   Social History  . Marital status: Divorced    Spouse name: N/A  . Number of children: N/A  . Years of education: N/A   Occupational History  . Not on file.   Social History Main Topics  . Smoking status: Never Smoker  . Smokeless tobacco: Not on file  . Alcohol use No  . Drug use: No  . Sexual activity: Not on file   Other Topics Concern  . Not on file   Social History Narrative  . No narrative on file   Family History  Problem  Relation Age of Onset  . Arthritis    . Hyperlipidemia    . Hypertension        VITAL SIGNS BP (!) 143/71   Pulse 74   Temp 98.2 F (36.8 C)   Resp 18   Ht 5\' 3"  (1.6 m)   Wt 182 lb 9.6 oz (82.8 kg)   SpO2 96%   BMI 32.35 kg/m   Patient's Medications  New Prescriptions   No medications on file  Previous Medications   ASCORBIC ACID (VITAMIN C) 500 MG TABLET    Take 500 mg by mouth 2 (two) times daily.   ATORVASTATIN (LIPITOR) 10 MG TABLET    Take 10 mg by mouth daily.   CITALOPRAM (CELEXA) 10 MG TABLET    Take 10 mg by mouth every morning.    CYANOCOBALAMIN (B-12) 1000 MCG SUBL    Place 1,000 mcg under the tongue daily.   DONEPEZIL (ARICEPT) 10 MG TABLET    Take 10 mg by mouth at bedtime.    FAMOTIDINE (PEPCID) 20 MG  TABLET    Take 20 mg by mouth 2 (two) times daily.   GABAPENTIN (NEURONTIN) 300 MG CAPSULE    Take 600 mg by mouth 3 (three) times daily.    HYDROCHLOROTHIAZIDE (,MICROZIDE/HYDRODIURIL,) 12.5 MG CAPSULE    Take 12.5 mg by mouth every morning.    HYDROCODONE-ACETAMINOPHEN (NORCO) 10-325 MG TABLET    Take one tablet by mouth once daily as needed for pain; Take one tablet by mouth twice daily for pain   LEVOTHYROXINE (SYNTHROID, LEVOTHROID) 125 MCG TABLET    Take 125 mcg by mouth daily before breakfast.   LOPERAMIDE (IMODIUM) 2 MG CAPSULE    Take 2 mg by mouth 4 (four) times daily as needed for diarrhea or loose stools.   MIRTAZAPINE (REMERON) 7.5 MG TABLET    Take 7.5 mg by mouth at bedtime.   PHENOL (CHLORASEPTIC) 1.4 % LIQD    Use as directed 2 sprays in the mouth or throat every 4 (four) hours as needed (for sore throat).    POLYETHYLENE GLYCOL (MIRALAX / GLYCOLAX) PACKET    Take 17 g by mouth daily.   POTASSIUM CHLORIDE SA (K-DUR,KLOR-CON) 20 MEQ TABLET    Take 20 mEq by mouth daily with breakfast. Do not  Crush. Take with food.   SENNA (SENOKOT) 8.6 MG TABS TABLET    Take 2 tablets by mouth 2 (two) times daily.    TIZANIDINE (ZANAFLEX) 2 MG TABLET    Take 1  tablet (2 mg total) by mouth 3 (three) times daily.  Modified Medications   No medications on file  Discontinued Medications   No medications on file     SIGNIFICANT DIAGNOSTIC EXAMS  03-07-15: ct of cervical spine: No evidence for acute cervical spine fracture.   LABS REVIEWED:   09-13-15: wbc 4.2; hgb 13.4;hct 40.6;  mcv 97.6 plt 97.6 plt 210; glucose 70; bun 17; creat 0.56; k+ 3.5; na++139; liver normal albumin 3.2; chol 119; ldl 50; trig 85; hdl 52; tsh 9.74; vit B12: 764; folate 7.1  10-13-15: tsh 2.82  01-17-16: chol 167; ldl 84; trig 102; hdl 63 04-05-16: wbc 4.0; hb 13.5; hct 41.1; mcv 95.4; plt 205; glucose 88; bun 12; creat 0.72; k+ 4.0; na++ 143; liver normal albumin 3.8  05-10-16: mag 1.9    Review of Systems Unable to perform ROS: Dementia    Physical Exam Vitals reviewed. Constitutional: No distress.  Overweight   Eyes: Conjunctivae are normal.  Neck: Neck supple. No JVD present. No thyromegaly present.  Cardiovascular: Normal rate, regular rhythm and intact distal pulses.   Respiratory: Effort normal and breath sounds normal. No respiratory distress. She has no wheezes.  GI: Soft. Bowel sounds are normal. She exhibits no distension. There is no tenderness.  Musculoskeletal: She exhibits no edema.  Able to move all extremities  Has limited range of motion in neck Has chronic gross tremors of upper extremities.    Lymphadenopathy:    She has no cervical adenopathy.  Neurological: She is alert.  Skin: Skin is warm and dry. She is not diaphoretic.  Has stage II on bilateral buttocks: red wound beds without signs of infection present.  Psychiatric: She has a normal mood and affect.    ASSESSMENT/ PLAN:  1. Bilateral buttock stage II ulceration: will continue duoderm treatment at this time and will continue to monitor her status.   MD is aware of resident's narcotic use and is in agreement with current plan of care. We will attempt to wean resident as apropriate  Ok Edwards NP [facility]  Contact [phone] Monday through Friday 8am- 5pm  After hours call [phone]

## 2016-08-19 ENCOUNTER — Non-Acute Institutional Stay (SKILLED_NURSING_FACILITY): Payer: Medicare Other | Admitting: Adult Health

## 2016-08-19 DIAGNOSIS — M47812 Spondylosis without myelopathy or radiculopathy, cervical region: Secondary | ICD-10-CM

## 2016-08-19 DIAGNOSIS — E785 Hyperlipidemia, unspecified: Secondary | ICD-10-CM | POA: Diagnosis not present

## 2016-08-19 DIAGNOSIS — B182 Chronic viral hepatitis C: Secondary | ICD-10-CM | POA: Diagnosis not present

## 2016-08-19 DIAGNOSIS — G3109 Other frontotemporal dementia: Secondary | ICD-10-CM

## 2016-08-19 DIAGNOSIS — I10 Essential (primary) hypertension: Secondary | ICD-10-CM

## 2016-08-19 DIAGNOSIS — F0280 Dementia in other diseases classified elsewhere without behavioral disturbance: Secondary | ICD-10-CM

## 2016-08-19 DIAGNOSIS — E034 Atrophy of thyroid (acquired): Secondary | ICD-10-CM

## 2016-08-19 DIAGNOSIS — G252 Other specified forms of tremor: Secondary | ICD-10-CM

## 2016-09-16 ENCOUNTER — Encounter: Payer: Self-pay | Admitting: Adult Health

## 2016-09-16 ENCOUNTER — Non-Acute Institutional Stay (SKILLED_NURSING_FACILITY): Payer: Medicare Other | Admitting: Adult Health

## 2016-09-16 DIAGNOSIS — M47812 Spondylosis without myelopathy or radiculopathy, cervical region: Secondary | ICD-10-CM | POA: Diagnosis not present

## 2016-09-16 DIAGNOSIS — G3109 Other frontotemporal dementia: Secondary | ICD-10-CM

## 2016-09-16 DIAGNOSIS — E034 Atrophy of thyroid (acquired): Secondary | ICD-10-CM | POA: Diagnosis not present

## 2016-09-16 DIAGNOSIS — B182 Chronic viral hepatitis C: Secondary | ICD-10-CM

## 2016-09-16 DIAGNOSIS — I10 Essential (primary) hypertension: Secondary | ICD-10-CM | POA: Diagnosis not present

## 2016-09-16 DIAGNOSIS — F0280 Dementia in other diseases classified elsewhere without behavioral disturbance: Secondary | ICD-10-CM

## 2016-09-16 DIAGNOSIS — E785 Hyperlipidemia, unspecified: Secondary | ICD-10-CM

## 2016-09-16 DIAGNOSIS — F418 Other specified anxiety disorders: Secondary | ICD-10-CM | POA: Diagnosis not present

## 2016-09-16 DIAGNOSIS — G252 Other specified forms of tremor: Secondary | ICD-10-CM | POA: Diagnosis not present

## 2016-09-16 NOTE — Progress Notes (Signed)
Location:   [facility] Room Number: 153 B Place of Service:  SNF (31)   CODE STATUS: Full Code  No Known Allergies  Chief Complaint  Patient presents with  . Medical Management of Chronic Issues    Routine Visit    HPI:  She is a long term resident of this facility being seen for the management of her chronic illnesses. Overall her status is without change. She spends nearly all of her time in bed per her choice. There are no nursing concerns at this time.    Past Medical History:  Diagnosis Date  . Anemia   . Arthritis    "legs, neck"  . Blood transfusion    hx of  . Bronchitis    hx of  . Chicken pox   . Depression   . Heart murmur    patient's primary Dr. Cranston Neighbor  . Hepatitis    Hx of  . Hyperlipidemia   . Hypothyroid   . Ulcer, stomach peptic    hx of  . Urinary frequency   . Urinary tract infection    hx of    Past Surgical History:  Procedure Laterality Date  . ABDOMINAL HYSTERECTOMY    . ABDOMINAL SURGERY    . BACK SURGERY     x3, fusion and rod  . BREAST LUMPECTOMY     right side  . DILATION AND CURETTAGE OF UTERUS    . KNEE ARTHROSCOPY  2007   right  . POSTERIOR CERVICAL FUSION/FORAMINOTOMY  11/13/2011   Procedure: POSTERIOR CERVICAL FUSION/FORAMINOTOMY LEVEL 1;  Surgeon: Carmela Hurt, MD;  Location: [facility];  Service: Neurosurgery;  Laterality: N/A;  Cervical one Cervical two posterior transpedicular fusion with lateral mass screws  . TUBAL LIGATION      Social History   Social History  . Marital status: Divorced    Spouse name: N/A  . Number of children: N/A  . Years of education: N/A   Occupational History  . Not on file.   Social History Main Topics  . Smoking status: Never Smoker  . Smokeless tobacco: Never Used  . Alcohol use No  . Drug use: No  . Sexual activity: Not on file   Other Topics Concern  . Not on file   Social History Narrative  . No narrative on file   Family History  Problem Relation  Age of Onset  . Arthritis    . Hyperlipidemia    . Hypertension        VITAL SIGNS BP (!) 175/91   Pulse (!) 50   Temp 97.9 F (36.6 C)   Resp 20   Ht 5\' 3"  (1.6 m)   Wt 175 lb (79.4 kg)   SpO2 94%   BMI 31.00 kg/m   Patient's Medications  New Prescriptions   No medications on file  Previous Medications   ASCORBIC ACID (VITAMIN C) 500 MG TABLET    Take 500 mg by mouth 2 (two) times daily.   ATORVASTATIN (LIPITOR) 10 MG TABLET    Take 10 mg by mouth daily.   CITALOPRAM (CELEXA) 10 MG TABLET    Take 10 mg by mouth every morning.    CYANOCOBALAMIN (B-12) 1000 MCG SUBL    Place 1,000 mcg under the tongue daily.   DONEPEZIL (ARICEPT) 10 MG TABLET    Take 10 mg by mouth at bedtime.    FAMOTIDINE (PEPCID) 20 MG TABLET    Take 20 mg by mouth 2 (  two) times daily.   GABAPENTIN (NEURONTIN) 300 MG CAPSULE    Take 600 mg by mouth 3 (three) times daily.    HYDROCHLOROTHIAZIDE (,MICROZIDE/HYDRODIURIL,) 12.5 MG CAPSULE    Take 12.5 mg by mouth every morning.    HYDROCODONE-ACETAMINOPHEN (NORCO) 10-325 MG TABLET    Take one tablet by mouth once daily as needed for pain; Take one tablet by mouth twice daily for pain   LEVOTHYROXINE (SYNTHROID, LEVOTHROID) 125 MCG TABLET    Take 125 mcg by mouth daily before breakfast.   LOPERAMIDE (IMODIUM) 2 MG CAPSULE    Take 2 mg by mouth 4 (four) times daily as needed for diarrhea or loose stools.   MIRTAZAPINE (REMERON) 7.5 MG TABLET    Take 7.5 mg by mouth at bedtime.   PHENOL (CHLORASEPTIC) 1.4 % LIQD    Use as directed 2 sprays in the mouth or throat every 4 (four) hours as needed (for sore throat).    POLYETHYLENE GLYCOL (MIRALAX / GLYCOLAX) PACKET    Take 17 g by mouth daily.   POTASSIUM CHLORIDE SA (K-DUR,KLOR-CON) 20 MEQ TABLET    Take 20 mEq by mouth daily with breakfast. Do not  Crush. Take with food.   SENNA (SENOKOT) 8.6 MG TABS TABLET    Take 2 tablets by mouth 2 (two) times daily.    TIZANIDINE (ZANAFLEX) 2 MG TABLET    Take 1 tablet (2 mg  total) by mouth 3 (three) times daily.  Modified Medications   No medications on file  Discontinued Medications   No medications on file     SIGNIFICANT DIAGNOSTIC EXAMS  03-07-15: ct of cervical spine: No evidence for acute cervical spine fracture.   LABS REVIEWED:   09-13-15: wbc 4.2; hgb 13.4;hct 40.6;  mcv 97.6 plt 97.6 plt 210; glucose 70; bun 17; creat 0.56; k+ 3.5; na++139; liver normal albumin 3.2; chol 119; ldl 50; trig 85; hdl 52; tsh 9.74; vit B12: 764; folate 7.1  10-13-15: tsh 2.82  01-17-16: chol 167; ldl 84; trig 102; hdl 63 04-05-16: wbc 4.0; hb 13.5; hct 41.1; mcv 95.4; plt 205; glucose 88; bun 12; creat 0.72; k+ 4.0; na++ 143; liver normal albumin 3.8  05-10-16: mag 1.9  08-06-16: pre-albumin 15    Review of Systems Unable to perform ROS: Dementia    Physical Exam Vitals reviewed. Constitutional: No distress.  Overweight   Eyes: Conjunctivae are normal.  Neck: Neck supple. No JVD present. No thyromegaly present.  Cardiovascular: Normal rate, regular rhythm and intact distal pulses.   Respiratory: Effort normal and breath sounds normal. No respiratory distress. She has no wheezes.  GI: Soft. Bowel sounds are normal. She exhibits no distension. There is no tenderness.  Musculoskeletal: She exhibits no edema.  Able to move all extremities  Has limited range of motion in neck Has chronic gross tremors of upper extremities.    Lymphadenopathy:    She has no cervical adenopathy.  Neurological: She is alert.  Skin: Skin is warm and dry. She is not diaphoretic.  Has stage II on bilateral buttocks: red wound beds without signs of infection present.  Psychiatric: She has a normal mood and affect.    ASSESSMENT/ PLAN:   1. Hypothyroidism: will continue  synthroid 125 mcg daily her tsh is 0.70  2. Dyslipidemia:  Her ldl is 84; will continue  lipitor 10 mg daily.   3. 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  Will continue  neurontin 600 mg three times daily; for her tremors.   4. Frontotemporal dementia: no change in status will continue aricept 10 mg daily her current weight is stable at 182 pounds.   5. Hypertension: will continue hctz 12.5 mg daily   6. GERD: will continue prilosec 20 mg daily   7. Constipation: will continue  senna daily    miralax  twice daily   8. Depression: will continue celexa 10 mg daily;   9. Allergic rhinitis: will continue flonase daily   10. Viral hepatitis C without coma: no change in status; will monitor  11. Hypokalemia: will continue k+ 20 meq daily     MD is aware of resident's narcotic use and is in agreement with current plan of care. We will attempt to wean resident as apropriate     Synthia Innocent NP [facility]  Contact [phone] Monday through Friday 8am- 5pm  After hours call [phone]

## 2016-09-17 LAB — HEPATIC FUNCTION PANEL
ALK PHOS: 76 U/L (ref 25–125)
ALT: 7 U/L (ref 7–35)
AST: 16 U/L (ref 13–35)
BILIRUBIN, TOTAL: 0.8 mg/dL

## 2016-09-17 LAB — LIPID PANEL
Cholesterol: 193 mg/dL (ref 0–200)
HDL: 68 mg/dL (ref 35–70)
LDL Cholesterol: 104 mg/dL
TRIGLYCERIDES: 105 mg/dL (ref 40–160)

## 2016-09-17 LAB — BASIC METABOLIC PANEL
BUN: 11 mg/dL (ref 4–21)
Creatinine: 0.5 mg/dL (ref 0.5–1.1)
GLUCOSE: 78 mg/dL
Potassium: 3.6 mmol/L (ref 3.4–5.3)
SODIUM: 143 mmol/L (ref 137–147)

## 2016-09-17 LAB — CBC AND DIFFERENTIAL
HCT: 46 % (ref 36–46)
HEMOGLOBIN: 14.1 g/dL (ref 12.0–16.0)
Neutrophils Absolute: 1 /uL
PLATELETS: 176 10*3/uL (ref 150–399)
WBC: 3.5 10^3/mL

## 2016-09-17 LAB — TSH: TSH: 0.22 u[IU]/mL — AB (ref 0.41–5.90)

## 2016-09-23 ENCOUNTER — Encounter: Payer: Self-pay | Admitting: Adult Health

## 2016-09-23 ENCOUNTER — Non-Acute Institutional Stay (SKILLED_NURSING_FACILITY): Payer: Medicare Other | Admitting: Adult Health

## 2016-09-23 DIAGNOSIS — I10 Essential (primary) hypertension: Secondary | ICD-10-CM

## 2016-09-23 DIAGNOSIS — E034 Atrophy of thyroid (acquired): Secondary | ICD-10-CM

## 2016-09-23 NOTE — Progress Notes (Signed)
.  Location:   [facility] Room Number: 153 B Place of Service:  SNF (31)   CODE STATUS: Full Code  No Known Allergies  Chief Complaint  Patient presents with  . Acute Visit    Follow up Lab Results    HPI:  Her blood pressure is elevated at 175/91. Her tsh is elevated as well.   Past Medical History:  Diagnosis Date  . Anemia   . Arthritis    "legs, neck"  . Blood transfusion    hx of  . Bronchitis    hx of  . Chicken pox   . Depression   . Heart murmur    patient's primary Dr. Cranston Neighbor  . Hepatitis    Hx of  . Hyperlipidemia   . Hypothyroid   . Ulcer, stomach peptic    hx of  . Urinary frequency   . Urinary tract infection    hx of    Past Surgical History:  Procedure Laterality Date  . ABDOMINAL HYSTERECTOMY    . ABDOMINAL SURGERY    . BACK SURGERY     x3, fusion and rod  . BREAST LUMPECTOMY     right side  . DILATION AND CURETTAGE OF UTERUS    . KNEE ARTHROSCOPY  2007   right  . POSTERIOR CERVICAL FUSION/FORAMINOTOMY  11/13/2011   Procedure: POSTERIOR CERVICAL FUSION/FORAMINOTOMY LEVEL 1;  Surgeon: Carmela Hurt, MD;  Location: [facility];  Service: Neurosurgery;  Laterality: N/A;  Cervical one Cervical two posterior transpedicular fusion with lateral mass screws  . TUBAL LIGATION      Social History   Social History  . Marital status: Divorced    Spouse name: N/A  . Number of children: N/A  . Years of education: N/A   Occupational History  . Not on file.   Social History Main Topics  . Smoking status: Never Smoker  . Smokeless tobacco: Never Used  . Alcohol use No  . Drug use: No  . Sexual activity: Not on file   Other Topics Concern  . Not on file   Social History Narrative  . No narrative on file   Family History  Problem Relation Age of Onset  . Arthritis    . Hyperlipidemia    . Hypertension        VITAL SIGNS BP (!) 175/91   Pulse (!) 50   Temp 97.9 F (36.6 C)   Resp 20   Ht 5\' 3"  (1.6 m)   Wt  175 lb (79.4 kg)   SpO2 94%   BMI 31.00 kg/m   Patient's Medications  New Prescriptions   No medications on file  Previous Medications   ASCORBIC ACID (VITAMIN C) 500 MG TABLET    Take 500 mg by mouth 2 (two) times daily.   ATORVASTATIN (LIPITOR) 10 MG TABLET    Take 10 mg by mouth daily.   CITALOPRAM (CELEXA) 10 MG TABLET    Take 10 mg by mouth every morning.    CYANOCOBALAMIN (B-12) 1000 MCG SUBL    Place 1,000 mcg under the tongue daily.   DONEPEZIL (ARICEPT) 10 MG TABLET    Take 10 mg by mouth at bedtime.    FAMOTIDINE (PEPCID) 20 MG TABLET    Take 20 mg by mouth 2 (two) times daily.   GABAPENTIN (NEURONTIN) 300 MG CAPSULE    Take 600 mg by mouth 3 (three) times daily.    HYDROCHLOROTHIAZIDE (,MICROZIDE/HYDRODIURIL,) 12.5 MG CAPSULE  Take 12.5 mg by mouth every morning.    HYDROCODONE-ACETAMINOPHEN (NORCO) 10-325 MG TABLET    Take one tablet by mouth once daily as needed for pain; Take one tablet by mouth twice daily for pain   LEVOTHYROXINE (SYNTHROID, LEVOTHROID) 125 MCG TABLET    Take 125 mcg by mouth daily before breakfast.   LOPERAMIDE (IMODIUM) 2 MG CAPSULE    Take 2 mg by mouth 4 (four) times daily as needed for diarrhea or loose stools.   MIRTAZAPINE (REMERON) 7.5 MG TABLET    Take 7.5 mg by mouth at bedtime.   PHENOL (CHLORASEPTIC) 1.4 % LIQD    Use as directed 2 sprays in the mouth or throat every 4 (four) hours as needed (for sore throat).    POLYETHYLENE GLYCOL (MIRALAX / GLYCOLAX) PACKET    Take 17 g by mouth daily.   POTASSIUM CHLORIDE SA (K-DUR,KLOR-CON) 20 MEQ TABLET    Take 20 mEq by mouth daily with breakfast. Do not  Crush. Take with food.   SENNA (SENOKOT) 8.6 MG TABS TABLET    Take 2 tablets by mouth 2 (two) times daily.    TIZANIDINE (ZANAFLEX) 2 MG TABLET    Take 1 tablet (2 mg total) by mouth 3 (three) times daily.  Modified Medications   No medications on file  Discontinued Medications   No medications on file     SIGNIFICANT DIAGNOSTIC  EXAMS   03-07-15: ct of cervical spine: No evidence for acute cervical spine fracture.   LABS REVIEWED:   09-13-15: wbc 4.2; hgb 13.4;hct 40.6;  mcv 97.6 plt 97.6 plt 210; glucose 70; bun 17; creat 0.56; k+ 3.5; na++139; liver normal albumin 3.2; chol 119; ldl 50; trig 85; hdl 52; tsh 9.74; vit B12: 764; folate 7.1  10-13-15: tsh 2.82  01-17-16: chol 167; ldl 84; trig 102; hdl 63 04-05-16: wbc 4.0; hb 13.5; hct 41.1; mcv 95.4; plt 205; glucose 88; bun 12; creat 0.72; k+ 4.0; na++ 143; liver normal albumin 3.8  05-10-16: mag 1.9  08-06-16: pre-albumin 15  09-23-16: wbc 3.5; hgb 14.1; hct 46.2; mcv 99.5; plt 176; chol 193; ldl 104; trig 105; hdl 68; tsh 0.22    Review of Systems Unable to perform ROS: Dementia    Physical Exam Vitals reviewed. Constitutional: No distress.  Overweight   Eyes: Conjunctivae are normal.  Neck: Neck supple. No JVD present. No thyromegaly present.  Cardiovascular: Normal rate, regular rhythm and intact distal pulses.   Respiratory: Effort normal and breath sounds normal. No respiratory distress. She has no wheezes.  GI: Soft. Bowel sounds are normal. She exhibits no distension. There is no tenderness.  Musculoskeletal: She exhibits no edema.  Able to move all extremities  Has limited range of motion in neck Has chronic gross tremors of upper extremities.    Lymphadenopathy:    She has no cervical adenopathy.  Neurological: She is alert.  Skin: Skin is warm and dry. She is not diaphoretic.  Psychiatric: She has a normal mood and affect.    ASSESSMENT/ PLAN:  1. Hypothyroidism: tsh is 0.22; will lower synthroid to 112 mcg daily and will check thyroid labs in one month   2. Hypertension: will continue hctz 12.5 mg daily  Will begin norvasc 2.5 mg daily      MD is aware of resident's narcotic use and is in agreement with current plan of care. We will attempt to wean resident as apropriate   Synthia Innocenteborah Green NP [facility]  Contact  [phone] Monday through Friday 8am- 5pm  After hours call [phone]

## 2016-09-25 NOTE — Progress Notes (Signed)
Location:   [facility] of Service:  SNF (31)   CODE STATUS: full code   No Known Allergies  Chief Complaint  Patient presents with  . Medical Management of Chronic Issues    HPI:  She is a long term resident of this facility being seen for the management of her chronic illnesses. Overall her status is stable. She will get out of bed on some occasions. There are no nursing concerns at this time.  She is unable to fully participate in the hpi or ros.    Past Medical History:  Diagnosis Date  . Anemia   . Arthritis    "legs, neck"  . Blood transfusion    hx of  . Bronchitis    hx of  . Chicken pox   . Depression   . Heart murmur    patient's primary Dr. Cranston Neighbor  . Hepatitis    Hx of  . Hyperlipidemia   . Hypothyroid   . Ulcer, stomach peptic    hx of  . Urinary frequency   . Urinary tract infection    hx of    Past Surgical History:  Procedure Laterality Date  . ABDOMINAL HYSTERECTOMY    . ABDOMINAL SURGERY    . BACK SURGERY     x3, fusion and rod  . BREAST LUMPECTOMY     right side  . DILATION AND CURETTAGE OF UTERUS    . KNEE ARTHROSCOPY  2007   right  . POSTERIOR CERVICAL FUSION/FORAMINOTOMY  11/13/2011   Procedure: POSTERIOR CERVICAL FUSION/FORAMINOTOMY LEVEL 1;  Surgeon: Carmela Hurt, MD;  Location: [facility];  Service: Neurosurgery;  Laterality: N/A;  Cervical one Cervical two posterior transpedicular fusion with lateral mass screws  . TUBAL LIGATION      Social History   Social History  . Marital status: Divorced    Spouse name: N/A  . Number of children: N/A  . Years of education: N/A   Occupational History  . Not on file.   Social History Main Topics  . Smoking status: Never Smoker  . Smokeless tobacco: Never Used  . Alcohol use No  . Drug use: No  . Sexual activity: Not on file   Other Topics Concern  . Not on file   Social History Narrative  . No narrative on file   Family History  Problem Relation Age of  Onset  . Arthritis    . Hyperlipidemia    . Hypertension        VITAL SIGNS BP (!) 143/62   Pulse 68   Temp 97.2 F (36.2 C)   Resp 18   Ht 5\' 3"  (1.6 m)   Wt 182 lb 9.6 oz (82.8 kg)   SpO2 96%   BMI 32.35 kg/m   Patient's Medications  New Prescriptions   No medications on file  Previous Medications   ASCORBIC ACID (VITAMIN C) 500 MG TABLET    Take 500 mg by mouth 2 (two) times daily.   ATORVASTATIN (LIPITOR) 10 MG TABLET    Take 10 mg by mouth daily.   CITALOPRAM (CELEXA) 10 MG TABLET    Take 10 mg by mouth every morning.    CYANOCOBALAMIN (B-12) 1000 MCG SUBL    Place 1,000 mcg under the tongue daily.   DONEPEZIL (ARICEPT) 10 MG TABLET    Take 10 mg by mouth at bedtime.    FAMOTIDINE (PEPCID) 20 MG TABLET    Take 20 mg  by mouth 2 (two) times daily.   GABAPENTIN (NEURONTIN) 300 MG CAPSULE    Take 600 mg by mouth 3 (three) times daily.    HYDROCHLOROTHIAZIDE (,MICROZIDE/HYDRODIURIL,) 12.5 MG CAPSULE    Take 12.5 mg by mouth every morning.    HYDROCODONE-ACETAMINOPHEN (NORCO) 10-325 MG TABLET    Take one tablet by mouth once daily as needed for pain; Take one tablet by mouth twice daily for pain   LEVOTHYROXINE (SYNTHROID, LEVOTHROID) 125 MCG TABLET    Take 125 mcg by mouth daily before breakfast.   LOPERAMIDE (IMODIUM) 2 MG CAPSULE    Take 2 mg by mouth 4 (four) times daily as needed for diarrhea or loose stools.   MIRTAZAPINE (REMERON) 7.5 MG TABLET    Take 7.5 mg by mouth at bedtime.   PHENOL (CHLORASEPTIC) 1.4 % LIQD    Use as directed 2 sprays in the mouth or throat every 4 (four) hours as needed (for sore throat).    POLYETHYLENE GLYCOL (MIRALAX / GLYCOLAX) PACKET    Take 17 g by mouth daily.   POTASSIUM CHLORIDE SA (K-DUR,KLOR-CON) 20 MEQ TABLET    Take 20 mEq by mouth daily with breakfast. Do not  Crush. Take with food.   SENNA (SENOKOT) 8.6 MG TABS TABLET    Take 2 tablets by mouth 2 (two) times daily.    TIZANIDINE (ZANAFLEX) 2 MG TABLET    Take 1 tablet (2 mg total)  by mouth 3 (three) times daily.  Modified Medications   No medications on file  Discontinued Medications   No medications on file     SIGNIFICANT DIAGNOSTIC EXAMS    03-07-15: chest x-ray: No active cardiopulmonary disease.  03-07-15: ct of cervical spine: No evidence for acute cervical spine fracture.   LABS REVIEWED:   09-13-15: wbc 4.2; hgb 13.4;hct 40.6;  mcv 97.6 plt 97.6 plt 210; glucose 70; bun 17; creat 0.56; k+ 3.5; na++139; liver normal albumin 3.2; chol 119; ldl 50; trig 85; hdl 52; tsh 9.74; vit B12: 764; folate 7.1  10-13-15: tsh 2.82  01-17-16: chol 167; ldl 84; trig 102; hdl 63 04-05-16: wbc 4.0; hgb 13.5; hct 41.1; mcv 95.4; plt 205; glucose 88; bun 12; creat 0.72; k+ 4.0; na++ 143; liver normal albumin 3.8  05-10-16: mag 1.9  08-06-16: pre-albumin 15    Review of Systems Unable to perform ROS: Dementia    Physical Exam Vitals reviewed. Constitutional: No distress.  Overweight   Eyes: Conjunctivae are normal.  Neck: Neck supple. No JVD present. No thyromegaly present.  Cardiovascular: Normal rate, regular rhythm and intact distal pulses.   Respiratory: Effort normal and breath sounds normal. No respiratory distress. She has no wheezes.  GI: Soft. Bowel sounds are normal. She exhibits no distension. There is no tenderness.  Musculoskeletal: She exhibits no edema.  Able to move all extremities  Has limited range of motion in neck Has chronic gross tremors of upper extremities.    Lymphadenopathy:    She has no cervical adenopathy.  Neurological: She is alert.  Skin: Skin is warm and dry. She is not diaphoretic.  Psychiatric: She has a normal mood and affect.     ASSESSMENT/ PLAN:  1. Hypothyroidism: will continue  synthroid 125 mcg daily her tsh is 0.70  2. Dyslipidemia:  Her ldl is 84; will continue  lipitor 10 mg daily.   3. 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  Will continue  neurontin 600 mg three times daily; for her tremors.   4. Frontotemporal dementia: no change in status will continue aricept 10 mg daily her current weight is stable at 182 pounds.   5. Hypertension: will continue hctz 12.5 mg daily   6. GERD: will continue prilosec 20 mg daily   7. Constipation: will continue  senna daily    miralax  twice daily   8. Depression: will continue celexa 10 mg daily;   9. Allergic rhinitis: will continue flonase daily   10. Viral hepatitis C without coma: no change in status; will monitor  11. Hypokalemia: will continue k+ 20 meq daily      MD is aware of resident's narcotic use and is in agreement with current plan of care. We will attempt to wean resident as apropriate   Synthia Innocenteborah Goldie Dimmer NP [facility]  Contact [phone] Monday through Friday 8am- 5pm  After hours call [phone]

## 2016-10-17 ENCOUNTER — Encounter: Payer: Self-pay | Admitting: Adult Health

## 2016-10-17 ENCOUNTER — Non-Acute Institutional Stay (SKILLED_NURSING_FACILITY): Payer: Medicare Other | Admitting: Adult Health

## 2016-10-17 DIAGNOSIS — B182 Chronic viral hepatitis C: Secondary | ICD-10-CM | POA: Diagnosis not present

## 2016-10-17 DIAGNOSIS — E785 Hyperlipidemia, unspecified: Secondary | ICD-10-CM | POA: Diagnosis not present

## 2016-10-17 DIAGNOSIS — M47812 Spondylosis without myelopathy or radiculopathy, cervical region: Secondary | ICD-10-CM | POA: Diagnosis not present

## 2016-10-17 DIAGNOSIS — E034 Atrophy of thyroid (acquired): Secondary | ICD-10-CM

## 2016-10-17 DIAGNOSIS — I10 Essential (primary) hypertension: Secondary | ICD-10-CM | POA: Diagnosis not present

## 2016-10-17 DIAGNOSIS — G252 Other specified forms of tremor: Secondary | ICD-10-CM | POA: Diagnosis not present

## 2016-10-17 NOTE — Progress Notes (Signed)
Location:    [facility] Room Number: 153 B Place of Service:  SNF (31)   CODE STATUS: full code   No Known Allergies  Chief Complaint  Patient presents with  . Medical Management of Chronic Issues    1 month follow up    HPI:  She is a long term resident of this facility being seen for the management of her chronic illnesses. Overall there is little change in her status. She does get out of bed most days. She is unable to fully participate in the hpi or ros; but did tell me that she is feeling good. There are no nursing concerns at this time.   Past Medical History:  Diagnosis Date  . Anemia   . Arthritis    "legs, neck"  . Blood transfusion    hx of  . Bronchitis    hx of  . Chicken pox   . Depression   . Heart murmur    patient's primary Dr. Cranston Neighbor  . Hepatitis    Hx of  . Hyperlipidemia   . Hypothyroid   . Ulcer, stomach peptic    hx of  . Urinary frequency   . Urinary tract infection    hx of    Past Surgical History:  Procedure Laterality Date  . ABDOMINAL HYSTERECTOMY    . ABDOMINAL SURGERY    . BACK SURGERY     x3, fusion and rod  . BREAST LUMPECTOMY     right side  . DILATION AND CURETTAGE OF UTERUS    . KNEE ARTHROSCOPY  2007   right  . POSTERIOR CERVICAL FUSION/FORAMINOTOMY  11/13/2011   Procedure: POSTERIOR CERVICAL FUSION/FORAMINOTOMY LEVEL 1;  Surgeon: Carmela Hurt, MD;  Location: [facility];  Service: Neurosurgery;  Laterality: N/A;  Cervical one Cervical two posterior transpedicular fusion with lateral mass screws  . TUBAL LIGATION      Social History   Social History  . Marital status: Divorced    Spouse name: N/A  . Number of children: N/A  . Years of education: N/A   Occupational History  . Not on file.   Social History Main Topics  . Smoking status: Never Smoker  . Smokeless tobacco: Never Used  . Alcohol use No  . Drug use: No  . Sexual activity: Not on file   Other Topics Concern  . Not on file    Social History Narrative  . No narrative on file   Family History  Problem Relation Age of Onset  . Arthritis    . Hyperlipidemia    . Hypertension        VITAL SIGNS BP 106/72   Pulse 67   Temp 98 F (36.7 C)   Resp 16   Ht  (1.6 m)   Wt 175 lb (79.4 kg)   SpO2 93%   BMI 31.00 kg/m   Patient's Medications  New Prescriptions   No medications on file  Previous Medications   AMINO ACIDS-PROTEIN HYDROLYS (FEEDING SUPPLEMENT, PRO-STAT SUGAR FREE 64,) LIQD    Take 30 mLs by mouth 2 (two) times daily.   AMLODIPINE (NORVASC) 2.5 MG TABLET    Take 2.5 mg by mouth daily.   ASCORBIC ACID (VITAMIN C) 500 MG TABLET    Take 500 mg by mouth 2 (two) times daily.   ATORVASTATIN (LIPITOR) 10 MG TABLET    Take 10 mg by mouth daily.   CITALOPRAM (CELEXA) 10 MG TABLET  Take 10 mg by mouth every morning.    CYANOCOBALAMIN 1000 MCG TABLET    Take 1,000 mcg by mouth daily.   DONEPEZIL (ARICEPT) 10 MG TABLET    Take 10 mg by mouth at bedtime.    FAMOTIDINE (PEPCID) 20 MG TABLET    Take 20 mg by mouth 2 (two) times daily.   GABAPENTIN (NEURONTIN) 300 MG CAPSULE    Take 600 mg by mouth 3 (three) times daily.    HYDROCHLOROTHIAZIDE (,MICROZIDE/HYDRODIURIL,) 12.5 MG CAPSULE    Take 12.5 mg by mouth every morning.    HYDROCODONE-ACETAMINOPHEN (NORCO) 10-325 MG TABLET    Take one tablet by mouth once daily as needed for pain; Take one tablet by mouth twice daily for pain   LEVOTHYROXINE (SYNTHROID, LEVOTHROID) 125 MCG TABLET    Take 112 mcg by mouth daily before breakfast.    LOPERAMIDE (IMODIUM) 2 MG CAPSULE    Take 2 mg by mouth 4 (four) times daily as needed for diarrhea or loose stools.   MIRTAZAPINE (REMERON) 7.5 MG TABLET    Take 7.5 mg by mouth at bedtime.   PHENOL (CHLORASEPTIC) 1.4 % LIQD    Use as directed 2 sprays in the mouth or throat every 4 (four) hours as needed (for sore throat).    POLYETHYLENE GLYCOL (MIRALAX / GLYCOLAX) PACKET    Take 17 g by mouth daily.   POTASSIUM  CHLORIDE SA (K-DUR,KLOR-CON) 20 MEQ TABLET    Take 20 mEq by mouth daily with breakfast. Do not  Crush. Take with food.   PROPYLENE GLYCOL (SYSTANE BALANCE) 0.6 % SOLN    Instill 1 drop in both eyes two times a day for Dry eyes   SENNA (SENOKOT) 8.6 MG TABS TABLET    Take 2 tablets by mouth 2 (two) times daily.    TIZANIDINE (ZANAFLEX) 2 MG TABLET    Take 1 tablet (2 mg total) by mouth 3 (three) times daily.   UNABLE TO FIND    [facility] Regular diet - [facility] [employer] texture, Regular consistency.  Chopped / cut up meats  Modified Medications   No medications on file  Discontinued Medications   CYANOCOBALAMIN (B-12) 1000 MCG SUBL    Place 1,000 mcg under the tongue daily.     SIGNIFICANT DIAGNOSTIC EXAMS   03-07-15: ct of cervical spine: No evidence for acute cervical spine fracture.   LABS REVIEWED:   10-13-15: tsh 2.82  01-17-16: chol 167; ldl 84; trig 102; hdl 63 04-05-16: wbc 4.0; hb 13.5; hct 41.1; mcv 95.4; plt 205; glucose 88; bun 12; creat 0.72; k+ 4.0; na++ 143; liver normal albumin 3.8  05-10-16: mag 1.9  08-06-16: pre-albumin 15  09-17-16: wbc 3.5; hgb 14.1; hct 46.2; mcv 99.5; plt 176; glucose 78; bun 10.6; creat 0.54; k+ 3.6; na++ 143; liver normal albumin 4.0  chol 193; ldl 104; trig 105; hdl 68; tsh 0.22    Review of Systems Unable to perform ROS: Dementia    Physical Exam Vitals reviewed. Constitutional: No distress.  Overweight   Eyes: Conjunctivae are normal.  Neck: Neck supple. No JVD present. No thyromegaly present.  Cardiovascular: Normal rate, regular rhythm and intact distal pulses.   Respiratory: Effort normal and breath sounds normal. No respiratory distress. She has no wheezes.  GI: Soft. Bowel sounds are normal. She exhibits no distension. There is no tenderness.  Musculoskeletal: She exhibits no edema.  Able to move all extremities  Has limited range of motion in neck Has chronic gross  tremors of upper extremities.    Lymphadenopathy:    She has no  cervical adenopathy.  Neurological: She is alert.  Skin: Skin is warm and dry. She is not diaphoretic.  Psychiatric: She has a normal mood and affect.     ASSESSMENT/ PLAN:   1. Hypothyroidism: will continue  synthroid 112 mcg daily her tsh is 0.22; follow up labs are pending   2. Dyslipidemia:  Her ldl is 104; will continue  lipitor 10 mg daily.   3. 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  Will continue  neurontin 600 mg three times daily; for her tremors.   4. Frontotemporal dementia: no change in status will continue aricept 10 mg daily her current weight is 175 pounds; in March 2018 was 182 pounds.  Will monitor   5. Hypertension: will continue hctz 12.5 mg daily and norvasc 2.5 mg daily   6. GERD: will continue prilosec 20 mg daily   7. Constipation: will continue  senna daily    miralax  twice daily   8. Depression: will continue celexa 10 mg daily;   9. Allergic rhinitis: will continue flonase daily   10. Viral hepatitis C without coma: no change in status; will monitor  11. Hypokalemia: will continue k+ 20 meq daily   Will stop prostat    MD is aware of resident's narcotic use and is in agreement with current plan of care. We will attempt to wean resident as apropriate   Synthia Innocent NP [facility]  Contact [phone] Monday through Friday 8am- 5pm  After hours call [phone]

## 2016-11-04 ENCOUNTER — Non-Acute Institutional Stay (SKILLED_NURSING_FACILITY): Payer: Medicare Other | Admitting: Adult Health

## 2016-11-04 ENCOUNTER — Encounter: Payer: Self-pay | Admitting: Adult Health

## 2016-11-04 DIAGNOSIS — F0280 Dementia in other diseases classified elsewhere without behavioral disturbance: Secondary | ICD-10-CM | POA: Diagnosis not present

## 2016-11-04 DIAGNOSIS — I10 Essential (primary) hypertension: Secondary | ICD-10-CM

## 2016-11-04 DIAGNOSIS — M47812 Spondylosis without myelopathy or radiculopathy, cervical region: Secondary | ICD-10-CM | POA: Diagnosis not present

## 2016-11-04 DIAGNOSIS — G252 Other specified forms of tremor: Secondary | ICD-10-CM

## 2016-11-04 DIAGNOSIS — G3109 Other frontotemporal dementia: Secondary | ICD-10-CM | POA: Diagnosis not present

## 2016-11-04 NOTE — Progress Notes (Signed)
Location:   [facility] Room Number: 153 B Place of Service:  SNF (31)    CODE STATUS: Full Code  No Known Allergies  Chief Complaint  Patient presents with  . Discharge Note    Discharge to SNF    HPI:  She is being discharged to another SNF in order to be closer to family. She will not need any home health or DME as the facility will provide. She will be followed by the medical provider at the receiving facility. Her narcotic prescription will be written.    Past Medical History:  Diagnosis Date  . Anemia   . Arthritis    "legs, neck"  . Blood transfusion    hx of  . Bronchitis    hx of  . Chicken pox   . Depression   . Heart murmur    patient's primary Dr. Cranston Neighbor  . Hepatitis    Hx of  . Hyperlipidemia   . Hypothyroid   . Ulcer, stomach peptic    hx of  . Urinary frequency   . Urinary tract infection    hx of    Past Surgical History:  Procedure Laterality Date  . ABDOMINAL HYSTERECTOMY    . ABDOMINAL SURGERY    . BACK SURGERY     x3, fusion and rod  . BREAST LUMPECTOMY     right side  . DILATION AND CURETTAGE OF UTERUS    . KNEE ARTHROSCOPY  2007   right  . POSTERIOR CERVICAL FUSION/FORAMINOTOMY  11/13/2011   Procedure: POSTERIOR CERVICAL FUSION/FORAMINOTOMY LEVEL 1;  Surgeon: Carmela Hurt, MD;  Location: [facility];  Service: Neurosurgery;  Laterality: N/A;  Cervical one Cervical two posterior transpedicular fusion with lateral mass screws  . TUBAL LIGATION      Social History   Social History  . Marital status: Divorced    Spouse name: N/A  . Number of children: N/A  . Years of education: N/A   Occupational History  . Not on file.   Social History Main Topics  . Smoking status: Never Smoker  . Smokeless tobacco: Never Used  . Alcohol use No  . Drug use: No  . Sexual activity: Not on file   Other Topics Concern  . Not on file   Social History Narrative  . No narrative on file   Family History  Problem Relation  Age of Onset  . Arthritis    . Hyperlipidemia    . Hypertension      VITAL SIGNS BP 112/66   Pulse 74   Temp 97.4 F (36.3 C)   Resp 18   Ht  (1.6 m)   Wt 180 lb (81.6 kg)   SpO2 98%   BMI 31.89 kg/m   Patient's Medications  New Prescriptions   No medications on file  Previous Medications   AMLODIPINE (NORVASC) 2.5 MG TABLET    Take 2.5 mg by mouth daily.   ASCORBIC ACID (VITAMIN C) 500 MG TABLET    Take 500 mg by mouth 2 (two) times daily.   ATORVASTATIN (LIPITOR) 10 MG TABLET    Take 10 mg by mouth daily.   CITALOPRAM (CELEXA) 10 MG TABLET    Take 10 mg by mouth every morning.    CYANOCOBALAMIN 1000 MCG TABLET    Take 1,000 mcg by mouth daily.   DONEPEZIL (ARICEPT) 10 MG TABLET    Take 10 mg by mouth at bedtime.    FAMOTIDINE (PEPCID) 20  MG TABLET    Take 20 mg by mouth 2 (two) times daily.   GABAPENTIN (NEURONTIN) 300 MG CAPSULE    Take 600 mg by mouth 3 (three) times daily.    HYDROCHLOROTHIAZIDE (,MICROZIDE/HYDRODIURIL,) 12.5 MG CAPSULE    Take 12.5 mg by mouth every morning.    HYDROCODONE-ACETAMINOPHEN (NORCO) 10-325 MG TABLET    Take one tablet by mouth once daily as needed for pain; Take one tablet by mouth twice daily for pain   LEVOTHYROXINE (SYNTHROID, LEVOTHROID) 125 MCG TABLET    Take 112 mcg by mouth daily before breakfast.    LOPERAMIDE (IMODIUM) 2 MG CAPSULE    Take 2 mg by mouth 4 (four) times daily as needed for diarrhea or loose stools.   MIRTAZAPINE (REMERON) 7.5 MG TABLET    Take 7.5 mg by mouth at bedtime.   PHENOL (CHLORASEPTIC) 1.4 % LIQD    Use as directed 2 sprays in the mouth or throat every 4 (four) hours as needed (for sore throat).    POLYETHYLENE GLYCOL (MIRALAX / GLYCOLAX) PACKET    Take 17 g by mouth daily.   POTASSIUM CHLORIDE SA (K-DUR,KLOR-CON) 20 MEQ TABLET    Take 20 mEq by mouth daily with breakfast. Do not  Crush. Take with food.   PROPYLENE GLYCOL (SYSTANE BALANCE) 0.6 % SOLN    Instill 1 drop in both eyes two times a day for Dry  eyes   SENNA (SENOKOT) 8.6 MG TABS TABLET    Take 2 tablets by mouth 2 (two) times daily.    TIZANIDINE (ZANAFLEX) 2 MG TABLET    Take 1 tablet (2 mg total) by mouth 3 (three) times daily.   UNABLE TO FIND    [facility] Regular diet - [facility] Finger Foods texture, Regular consistency.  Chopped / cut up meats  Modified Medications   No medications on file  Discontinued Medications   AMINO ACIDS-PROTEIN HYDROLYS (FEEDING SUPPLEMENT, PRO-STAT SUGAR FREE 64,) LIQD    Take 30 mLs by mouth 2 (two) times daily.     SIGNIFICANT DIAGNOSTIC EXAMS  03-07-15: ct of cervical spine: No evidence for acute cervical spine fracture.   LABS REVIEWED:   10-13-15: tsh 2.82  01-17-16: chol 167; ldl 84; trig 102; hdl 63 04-05-16: wbc 4.0; hb 13.5; hct 41.1; mcv 95.4; plt 205; glucose 88; bun 12; creat 0.72; k+ 4.0; na++ 143; liver normal albumin 3.8  05-10-16: mag 1.9  08-06-16: pre-albumin 15  09-17-16: wbc 3.5; hgb 14.1; hct 46.2; mcv 99.5; plt 176; glucose 78; bun 10.6; creat 0.54; k+ 3.6; na++ 143; liver normal albumin 4.0  chol 193; ldl 104; trig 105; hdl 68; tsh 0.22    Review of Systems Unable to perform ROS: Dementia    Physical Exam Vitals reviewed. Constitutional: No distress.  Overweight   Eyes: Conjunctivae are normal.  Neck: Neck supple. No JVD present. No thyromegaly present.  Cardiovascular: Normal rate, regular rhythm and intact distal pulses.   Respiratory: Effort normal and breath sounds normal. No respiratory distress. She has no wheezes.  GI: Soft. Bowel sounds are normal. She exhibits no distension. There is no tenderness.  Musculoskeletal: She exhibits no edema.  Able to move all extremities  Has limited range of motion in neck Has chronic gross tremors of upper extremities.    Lymphadenopathy:    She has no cervical adenopathy.  Neurological: She is alert.  Skin: Skin is warm and dry. She is not diaphoretic.  Psychiatric: She has a normal mood and  affect.     ASSESSMENT/  PLAN:  Patient is being discharged with the following home health services:  None required  Patient is being discharged with the following durable medical equipment:  None required   Patient has been advised to f/u with their PCP in 1-2 weeks to bring them up to date on their rehab stay.  Social services at facility was responsible for arranging this appointment.  Pt was provided with a 30 day supply of prescriptions for medications and refills must be obtained from their PCP.  For controlled substances, a more limited supply may be provided adequate until PCP appointment only. #90 vicodin 10/325 mg tabs.    Time spent with patient 40   minutes >50% time spent counseling; reviewing medical record; tests; labs; and developing future plan of care   Synthia Innocent NP [facility]  Contact [phone] Monday through Friday 8am- 5pm  After hours call [phone]

## 2018-02-25 ENCOUNTER — Encounter: Payer: Self-pay | Admitting: Internal Medicine

## 2018-03-16 ENCOUNTER — Other Ambulatory Visit
Admission: RE | Admit: 2018-03-16 | Discharge: 2018-03-16 | Disposition: A | Discharge status: Home / Self Care | Payer: Medicare Other | Source: Ambulatory Visit | Attending: Family Medicine | Admitting: Family Medicine

## 2018-03-16 DIAGNOSIS — R197 Diarrhea, unspecified: Secondary | ICD-10-CM | POA: Insufficient documentation

## 2018-03-16 LAB — C DIFFICILE QUICK SCREEN W PCR REFLEX
C DIFFICILE (CDIFF) INTERP: NOT DETECTED
C DIFFICILE (CDIFF) TOXIN: NEGATIVE
C Diff antigen: NEGATIVE

## 2018-11-23 ENCOUNTER — Other Ambulatory Visit
Admission: RE | Admit: 2018-11-23 | Discharge: 2018-11-23 | Disposition: A | Discharge status: Home / Self Care | Payer: Medicare Other | Source: Ambulatory Visit | Attending: Family Medicine | Admitting: Family Medicine

## 2018-11-23 DIAGNOSIS — Z03818 Encounter for observation for suspected exposure to other biological agents ruled out: Secondary | ICD-10-CM | POA: Insufficient documentation

## 2018-11-23 LAB — SARS CORONAVIRUS 2 BY RT PCR (HOSPITAL ORDER, PERFORMED IN ~~LOC~~ HOSPITAL LAB): SARS Coronavirus 2: NEGATIVE

## 2019-10-13 ENCOUNTER — Non-Acute Institutional Stay: Payer: Medicare Other | Admitting: Primary Care

## 2019-10-13 ENCOUNTER — Other Ambulatory Visit: Payer: Self-pay

## 2019-10-13 DIAGNOSIS — Z515 Encounter for palliative care: Secondary | ICD-10-CM

## 2019-10-13 NOTE — Progress Notes (Signed)
[facility] Consult Note Telephone: [phone]  Fax: [phone]    PATIENT NAME: Mallory Burke [address] [place] [address] [phone] (home) [phone] (work) DOB: 1933/02/01 MRN: [id number]  PRIMARY CARE PROVIDER:  Juluis Pitch, MD [address] [place],  [place] [address]   REFERRING PROVIDER: Juluis Pitch, MD [address] [place],  [place] [address]   RESPONSIBLE PARTY:   Extended Emergency Contact Information Primary Emergency Contact: Deliah Goody Address: [address]           [place], [place] [address] Johnnette [place] of [place] Phone: [phone] Mobile Phone: [phone] Relation: Granddaughter Secondary Emergency Contact: Anthonette [place], [place] of [place] Phone: [phone] Relation: Granddaughter   ASSESSMENT AND RECOMMENDATIONS:   1. Advance Care Planning/Goals of Care: Goals include to maximize quality of life and symptom management. MOST pending but family states DNR, no feeding tube on phone today.  2. Symptom Management:   I met Mrs. Tommi Emery today at her nursing home. She was in bed, and while responsive to touch and stimulation, did not open her eyes or engage. She did not appear to be in any distress. Staff report she eats about 25% or less. Her lungs are clear to auscultation. Heart rate was regular. She seem to have somewhat of a barrel chest but no  abdominal distention and her bowel sounds were present. Staff states a decubitus ulcer but I was not able to obtain the stage.   Patient's granddaughter was contacted as power of attorney. She has the clearance to make medical decisions. She told me that for her grandmother she hoped for more TLC, things like having lotion with bath , massage,  and her nails painted red which is her favorite color. She also wanted to be sure that the patient was being fed her meals and oral care as today she also  needed some oral care.  Patient's granddaughter state she has not personally visited because her mother passed away at the facility several months ago and this is a hard memory, but the other family members have visited her grandmother.   We discussed goals of care. She stated that they have verbally done the MOST form but  that she has to go by to sign it. She was able to endorse that  they have chosen do not resuscitate order and also no feeding tube. I also let her know about the purpose of palliative care and that when hospice or end-of-life care with appropriate that I would discuss this with her. I let her know I would check with patient probably within the next 2 to 3 weeks and then roughly monthly but this will depend on the patient's status. I do feel and granddaughter also agree that patient is very frail.  3. Family /Caregiver/Community Supports: Otila Kluver is [place], pt lives in LTC.  4. Cognitive / Functional decline:  Min. Responsive today, staff reports intermittent, dependent in all adls, iadls.  5. Follow up Palliative Care Visit: Palliative care will continue to follow for goals of care clarification and symptom management. Return 2-4 weeks or prn.  I spent 45 minutes providing this consultation,  from 1500 to 1545. More than 50% of the time in this consultation was spent coordinating communication.   HISTORY OF PRESENT ILLNESS:  Mallory Burke is a 86 y.o. year old female with multiple medical problems including advanced dementia, decubitus, arthritis,  depression. Palliative Care was asked to follow this patient by consultation request of Juluis Pitch, MD [address] [place],  [place] [address]  to help address advance care planning and goals of care. This is the initial visit.  CODE STATUS: DNR  PPS: 30% (weak) HOSPICE ELIGIBILITY/DIAGNOSIS: TBD  PAST MEDICAL HISTORY:  Past Medical History:  Diagnosis Date  . Anemia   . Arthritis    "legs, neck"  . Blood transfusion    hx of    . Bronchitis    hx of  . Chicken pox   . Depression   . Heart murmur    patient's primary Dr. Nestor Lewandowsky  . Hepatitis    Hx of  . Hyperlipidemia   . Hypothyroid   . Ulcer, stomach peptic    hx of  . Urinary frequency   . Urinary tract infection    hx of    SOCIAL HX:  Social History   Tobacco Use  . Smoking status: Never Smoker  . Smokeless tobacco: Never Used  Substance Use Topics  . Alcohol use: No    ALLERGIES: No Known Allergies   PERTINENT MEDICATIONS:  Outpatient Encounter Medications as of 10/13/2019  Medication Sig  . ascorbic acid (VITAMIN C) 500 MG tablet Take 500 mg by mouth 2 (two) times daily.  Marland Kitchen atorvastatin (LIPITOR) 10 MG tablet Take 10 mg by mouth daily.  . cyanocobalamin 1000 MCG tablet Take 1,000 mcg by mouth daily.  Marland Kitchen donepezil (ARICEPT) 10 MG tablet Take 10 mg by mouth at bedtime.   . famotidine (PEPCID) 20 MG tablet Take 20 mg by mouth 2 (two) times daily.  Marland Kitchen gabapentin (NEURONTIN) 300 MG capsule Take 600 mg by mouth 3 (three) times daily.   Marland Kitchen levothyroxine (SYNTHROID, LEVOTHROID) 125 MCG tablet Take 112 mcg by mouth daily before breakfast.   . loperamide (IMODIUM) 2 MG capsule Take 2 mg by mouth 4 (four) times daily as needed for diarrhea or loose stools.  . mirtazapine (REMERON) 7.5 MG tablet Take 7.5 mg by mouth at bedtime.  . phenol (CHLORASEPTIC) 1.4 % LIQD Use as directed 2 sprays in the mouth or throat every 4 (four) hours as needed (for sore throat).   . polyethylene glycol (MIRALAX / GLYCOLAX) packet Take 17 g by mouth daily.  Marland Kitchen Propylene Glycol (SYSTANE BALANCE) 0.6 % SOLN Instill 1 drop in both eyes two times a day for Dry eyes  . senna (SENOKOT) 8.6 MG TABS tablet Take 2 tablets by mouth 2 (two) times daily.   Marland Kitchen tiZANidine (ZANAFLEX) 2 MG tablet Take 1 tablet (2 mg total) by mouth 3 (three) times daily.  . [DISCONTINUED] amLODipine (NORVASC) 2.5 MG tablet Take 2.5 mg by mouth daily.  . [DISCONTINUED] citalopram (CELEXA) 10 MG tablet Take 10  mg by mouth every morning.   . [DISCONTINUED] hydrochlorothiazide (,MICROZIDE/HYDRODIURIL,) 12.5 MG capsule Take 12.5 mg by mouth every morning.   . [DISCONTINUED] HYDROcodone-acetaminophen (NORCO) 10-325 MG tablet Take one tablet by mouth once daily as needed for pain; Take one tablet by mouth twice daily for pain  . [DISCONTINUED] potassium chloride SA (K-DUR,KLOR-CON) 20 MEQ tablet Take 20 mEq by mouth daily with breakfast. Do not  Crush. Take with food.  . [DISCONTINUED] UNABLE TO FIND HSG Regular diet - HSG Finger Foods texture, Regular consistency.  Chopped / cut up meats   No facility-administered encounter medications on file as of 10/13/2019.     PHYSICAL EXAM / ROS:   Current and past weights: Current,  110 lbs, weight 134 lbs in Jan 2021, 18% General: NAD, frail appearing, thin Cardiovascular: S1S2, no chest pain reported, no edema Pulmonary: no cough, no increased SOB, lung clear Abdomen: appetite fair, endorses constipation, incontinent of bowel GU: denies dysuria, incontinent of urine MSK:  Foot drop  deformities, non ambulatory Skin: decubitus on sacrum Neurological: Weakness, advanced dementia  Jason Coop, NP [facility]  COVID-19 PATIENT SCREENING TOOL  Person answering questions: ____________Staff_______ _____   1.  Is the patient or any family member in the home showing any signs or symptoms regarding respiratory infection?               Person with Symptom- __________NA_________________  a. Fever                                                                          Yes___ No___          ___________________  b. Shortness of breath                                                    Yes___ No___          ___________________ c. Cough/congestion                                       Yes___  No___         ___________________ d. Body aches/pains                                                         Yes___ No___        ____________________ e. Gastrointestinal  symptoms (diarrhea, nausea)           Yes___ No___        ____________________  2. Within the past 14 days, has anyone living in the home had any contact with someone with or under investigation for COVID-19?    Yes___ No_X_   Person __________________

## 2019-11-06 DEATH — deceased
